# Patient Record
Sex: Female | Born: 1960 | Race: Black or African American | Hispanic: No | Marital: Married | Smoking: Current every day smoker
Health system: Southern US, Community
[De-identification: ages and names within clinical notes are randomized; demographics above are authoritative.]

## PROBLEM LIST (undated history)

## (undated) DIAGNOSIS — F32A Depression, unspecified: Secondary | ICD-10-CM

## (undated) DIAGNOSIS — I10 Essential (primary) hypertension: Secondary | ICD-10-CM

## (undated) DIAGNOSIS — D573 Sickle-cell trait: Secondary | ICD-10-CM

## (undated) DIAGNOSIS — M810 Age-related osteoporosis without current pathological fracture: Secondary | ICD-10-CM

## (undated) DIAGNOSIS — J449 Chronic obstructive pulmonary disease, unspecified: Secondary | ICD-10-CM

## (undated) DIAGNOSIS — N200 Calculus of kidney: Secondary | ICD-10-CM

## (undated) DIAGNOSIS — I509 Heart failure, unspecified: Secondary | ICD-10-CM

## (undated) DIAGNOSIS — F329 Major depressive disorder, single episode, unspecified: Secondary | ICD-10-CM

## (undated) DIAGNOSIS — E785 Hyperlipidemia, unspecified: Secondary | ICD-10-CM

## (undated) HISTORY — PX: TUBAL LIGATION: SHX77

---
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
## 2011-11-19 ENCOUNTER — Ambulatory Visit: Payer: Self-pay | Admitting: Family Medicine

## 2012-09-29 DIAGNOSIS — E785 Hyperlipidemia, unspecified: Secondary | ICD-10-CM | POA: Insufficient documentation

## 2012-09-29 DIAGNOSIS — I10 Essential (primary) hypertension: Secondary | ICD-10-CM

## 2013-06-08 ENCOUNTER — Emergency Department: Payer: Self-pay | Admitting: Emergency Medicine

## 2013-07-18 ENCOUNTER — Emergency Department: Payer: Self-pay | Admitting: Emergency Medicine

## 2013-07-18 LAB — CBC WITH DIFFERENTIAL/PLATELET
Eosinophil #: 0.3 10*3/uL (ref 0.0–0.7)
Eosinophil %: 4.3 %
HCT: 36.7 % (ref 35.0–47.0)
HGB: 12.4 g/dL (ref 12.0–16.0)
Lymphocyte #: 2.3 10*3/uL (ref 1.0–3.6)
Lymphocyte %: 33.5 %
MCH: 29.2 pg (ref 26.0–34.0)
MCHC: 33.8 g/dL (ref 32.0–36.0)
MCV: 86 fL (ref 80–100)
Monocyte #: 0.6 x10 3/mm (ref 0.2–0.9)
Monocyte %: 8.6 %
Neutrophil #: 3.5 10*3/uL (ref 1.4–6.5)
Platelet: 330 10*3/uL (ref 150–440)
RBC: 4.26 10*6/uL (ref 3.80–5.20)
RDW: 13.5 % (ref 11.5–14.5)
WBC: 6.8 10*3/uL (ref 3.6–11.0)

## 2013-07-18 LAB — TROPONIN I
Troponin-I: <0.02 ng/mL
Troponin-I: <0.02 ng/mL

## 2013-07-18 LAB — BASIC METABOLIC PANEL
Anion Gap: 5 — ABNORMAL LOW (ref 7–16)
Calcium, Total: 8.9 mg/dL (ref 8.5–10.1)
Chloride: 109 mmol/L — ABNORMAL HIGH (ref 98–107)
Osmolality: 283 (ref 275–301)
Sodium: 140 mmol/L (ref 136–145)

## 2014-07-05 ENCOUNTER — Emergency Department: Payer: Self-pay | Admitting: Emergency Medicine

## 2014-07-05 LAB — CBC
HCT: 41.7 % (ref 35.0–47.0)
HGB: 14.1 g/dL (ref 12.0–16.0)
MCH: 30.1 pg (ref 26.0–34.0)
MCHC: 33.8 g/dL (ref 32.0–36.0)
MCV: 89 fL (ref 80–100)
PLATELETS: 326 10*3/uL (ref 150–440)
RBC: 4.67 10*6/uL (ref 3.80–5.20)
RDW: 13.9 % (ref 11.5–14.5)
WBC: 5.9 10*3/uL (ref 3.6–11.0)

## 2014-07-05 LAB — URINALYSIS, COMPLETE
Bilirubin,UR: NEGATIVE
Blood: NEGATIVE
GLUCOSE, UR: NEGATIVE mg/dL (ref 0–75)
Hyaline Cast: 2
KETONE: NEGATIVE
NITRITE: NEGATIVE
Ph: 6 (ref 4.5–8.0)
Protein: NEGATIVE
RBC,UR: 1 /HPF (ref 0–5)
Specific Gravity: 1.011 (ref 1.003–1.030)
WBC UR: 19 /HPF (ref 0–5)

## 2014-07-05 LAB — COMPREHENSIVE METABOLIC PANEL
ALBUMIN: 3.7 g/dL (ref 3.4–5.0)
Alkaline Phosphatase: 117 U/L — ABNORMAL HIGH
Anion Gap: 6 — ABNORMAL LOW (ref 7–16)
BILIRUBIN TOTAL: 0.7 mg/dL (ref 0.2–1.0)
BUN: 18 mg/dL (ref 7–18)
CALCIUM: 8.6 mg/dL (ref 8.5–10.1)
CHLORIDE: 104 mmol/L (ref 98–107)
CO2: 31 mmol/L (ref 21–32)
Creatinine: 1.11 mg/dL (ref 0.60–1.30)
EGFR (Non-African Amer.): 55 — ABNORMAL LOW
GLUCOSE: 101 mg/dL — AB (ref 65–99)
Osmolality: 283 (ref 275–301)
Potassium: 3.8 mmol/L (ref 3.5–5.1)
SGOT(AST): 18 U/L (ref 15–37)
SGPT (ALT): 30 U/L
Sodium: 141 mmol/L (ref 136–145)
Total Protein: 7.7 g/dL (ref 6.4–8.2)

## 2014-07-05 LAB — TROPONIN I

## 2014-09-09 ENCOUNTER — Emergency Department: Payer: Self-pay | Admitting: Emergency Medicine

## 2015-07-18 ENCOUNTER — Emergency Department: Payer: Self-pay

## 2015-07-18 ENCOUNTER — Emergency Department
Admission: EM | Admit: 2015-07-18 | Discharge: 2015-07-18 | Disposition: A | Discharge status: Home / Self Care | Payer: Self-pay | Attending: Emergency Medicine | Admitting: Emergency Medicine

## 2015-07-18 ENCOUNTER — Encounter: Payer: Self-pay | Admitting: Emergency Medicine

## 2015-07-18 DIAGNOSIS — F17210 Nicotine dependence, cigarettes, uncomplicated: Secondary | ICD-10-CM | POA: Insufficient documentation

## 2015-07-18 DIAGNOSIS — Z79899 Other long term (current) drug therapy: Secondary | ICD-10-CM | POA: Insufficient documentation

## 2015-07-18 DIAGNOSIS — M545 Low back pain: Secondary | ICD-10-CM | POA: Insufficient documentation

## 2015-07-18 DIAGNOSIS — G8929 Other chronic pain: Secondary | ICD-10-CM | POA: Insufficient documentation

## 2015-07-18 HISTORY — DX: Calculus of kidney: N20.0

## 2015-07-18 MED ORDER — MELOXICAM 7.5 MG PO TABS: 7.5000 mg | ORAL_TABLET | Freq: Every day | ORAL | Status: AC

## 2015-07-18 NOTE — ED Provider Notes (Signed)
[facility] Emergency Department Provider Note  ____________________________________________  Time seen: Approximately 7:13 AM  I have reviewed the triage vital signs and the nursing notes.   HISTORY  Chief Complaint Back Pain  HPI Penny Gill is a 54 y.o. female is here complaining of low back pain. Patient states pain is nonradiating and worse with movement. Patient states that her low back pain has been going on for over a year. She states that she was first diagnosed with sciatica at [facility]. She states that she is now going to [facility] and was supposed to be referred for physical therapy. She states in the year that she has been having back pain and no one is x-rayed her back. She denies any urinary symptoms. She denies any paresthesias to her legs. Currently she is not taking any medication for her back pain. Currently she states her pain is 9 out of 10. Pain is worse with standing, walking or physical activity. She denies any recent injury but states she did have an old injury over a year ago that was work related.   Past Medical History  Diagnosis Date  . Kidney stone     There are no active problems to display for this patient.   Past Surgical History  Procedure Laterality Date  . Tubal ligation      Current Outpatient Rx  Name  Route  Sig  Dispense  Refill  . FLUoxetine (PROZAC) 40 MG capsule   Oral   Take 40 mg by mouth daily.         . meloxicam (MOBIC) 7.5 MG tablet   Oral   Take 1 tablet (7.5 mg total) by mouth daily.   30 tablet   2     Allergies Review of patient's allergies indicates no known allergies.  No family history on file.  Social History Social History  Substance Use Topics  . Smoking status: Current Every Day Smoker -- 0.50 packs/day    Types: Cigarettes  . Smokeless tobacco: None  . Alcohol Use: No    Review of Systems Constitutional: No fever/chills ENT: No sore throat. Cardiovascular:  Denies chest pain. Respiratory: Denies shortness of breath. Gastrointestinal: No abdominal pain.  No nausea, no vomiting.  No diarrhea.  No constipation. Genitourinary: Negative for dysuria. Musculoskeletal: Positive chronic low back pain. Skin: Negative for rash. Neurological: Negative for headaches, focal weakness or numbness.  10-point ROS otherwise negative.  ____________________________________________   PHYSICAL EXAM:  VITAL SIGNS: ED Triage Vitals  Enc Vitals Group     BP 07/18/15 0702 112/77 mmHg     Pulse Rate 07/18/15 0702 89     Resp 07/18/15 0702 18     Temp 07/18/15 0702 97.9 F (36.6 C)     Temp Source 07/18/15 0702 Oral     SpO2 07/18/15 0702 100 %     Weight 07/18/15 0702 86 lb 14.4 oz (39.418 kg)     Height 07/18/15 0702 5' (1.524 m)     Head Cir --      Peak Flow --      Pain Score 07/18/15 0701 9     Pain Loc --      Pain Edu? --      Excl. in GC? --     Constitutional: Alert and oriented. Well appearing and in no acute distress. Eyes: Conjunctivae are normal. PERRL. EOMI. Head: Atraumatic. Nose: No congestion/rhinnorhea. Neck: No stridor.   Cardiovascular: Normal rate, regular rhythm. Grossly normal  heart sounds.  Good peripheral circulation. Respiratory: Normal respiratory effort.  No retractions. Lungs CTAB. Gastrointestinal: Soft and nontender. No distention. Musculoskeletal: Examination of the back no gross deformity. There is some point tenderness on palpation of the L4-L5 and S1 area and paravertebral muscles. No active muscle spasms are seen with range of motion. Straight leg raises were 90 with discomfort in the thigh area bilaterally. No lower extremity tenderness nor edema.  No joint effusions. Neurologic:  Normal speech and language. No gross focal neurologic deficits are appreciated. No gait instability. Reflexes are 2+ bilaterally. Skin:  Skin is warm, dry and intact. No rash noted. Psychiatric: Mood and affect are normal. Speech and  behavior are normal.  ____________________________________________   LABS (all labs ordered are listed, but only abnormal results are displayed)  Labs Reviewed - No data to display  RADIOLOGY  Lumbar spine x-ray per radiologist shows very minimal degenerative changes. Mild levoscoliosis seen which may be positional. No acute findings. ____________________________________________   PROCEDURES  Procedure(s) performed: None  Critical Care performed: No  ____________________________________________   INITIAL IMPRESSION / ASSESSMENT AND PLAN / ED COURSE  Pertinent labs & imaging results that were available during my care of the patient were reviewed by me and considered in my medical decision making (see chart for details).  Patient was placed on medic 7.5 one daily she is also to follow-up with her primary care doctor at [facility] or Dr. Hyacinth Meeker in orthopedics. She is encouraged to use ice or heat to her back. She is told to return to the emergency room if any sudden loss of bowel or bladder function ____________________________________________   FINAL CLINICAL IMPRESSION(S) / ED DIAGNOSES  Final diagnoses:  Chronic low back pain      Tommi Rumps, PA-C 07/18/15 1023  Sharman Cheek, MD 07/18/15 [phone]

## 2015-07-18 NOTE — ED Notes (Signed)
Having lower back pain.   Pain is non radiating. Denies any recent injury  States she had an old injury  Ambulates well to treatment room

## 2015-07-18 NOTE — ED Notes (Signed)
Patient ambulatory to triage with steady gait, without difficulty or distress noted; pt reports lower back pain x year; st old work injury and was dx with sciatica

## 2015-08-02 DIAGNOSIS — M47816 Spondylosis without myelopathy or radiculopathy, lumbar region: Secondary | ICD-10-CM | POA: Insufficient documentation

## 2015-09-11 ENCOUNTER — Emergency Department
Admission: EM | Admit: 2015-09-11 | Discharge: 2015-09-11 | Disposition: A | Discharge status: Home / Self Care | Payer: Self-pay | Attending: Emergency Medicine | Admitting: Emergency Medicine

## 2015-09-11 ENCOUNTER — Encounter: Payer: Self-pay | Admitting: Emergency Medicine

## 2015-09-11 DIAGNOSIS — Z79899 Other long term (current) drug therapy: Secondary | ICD-10-CM | POA: Insufficient documentation

## 2015-09-11 DIAGNOSIS — F17210 Nicotine dependence, cigarettes, uncomplicated: Secondary | ICD-10-CM | POA: Insufficient documentation

## 2015-09-11 DIAGNOSIS — K029 Dental caries, unspecified: Secondary | ICD-10-CM | POA: Insufficient documentation

## 2015-09-11 DIAGNOSIS — K0889 Other specified disorders of teeth and supporting structures: Secondary | ICD-10-CM

## 2015-09-11 DIAGNOSIS — K011 Impacted teeth: Secondary | ICD-10-CM | POA: Insufficient documentation

## 2015-09-11 DIAGNOSIS — K047 Periapical abscess without sinus: Secondary | ICD-10-CM | POA: Insufficient documentation

## 2015-09-11 MED ORDER — LIDOCAINE VISCOUS 2 % MT SOLN: 20.0000 mL | OROMUCOSAL | Status: DC | PRN

## 2015-09-11 MED ORDER — AMOXICILLIN 500 MG PO CAPS: 500.0000 mg | ORAL_CAPSULE | Freq: Three times a day (TID) | ORAL | Status: DC

## 2015-09-11 NOTE — Discharge Instructions (Signed)
Dental Care and Dentist Visits °Dental care supports good overall health. Regular dental visits can also help you avoid dental pain, bleeding, infection, and other more serious health problems in the future. It is important to keep the mouth healthy because diseases in the teeth, gums, and other oral tissues can spread to other areas of the body. Some problems, such as diabetes, heart disease, and pre-term labor have been associated with poor oral health.  °See your dentist every 6 months. If you experience emergency problems such as a toothache or broken tooth, go to the dentist right away. If you see your dentist regularly, you may catch problems early. It is easier to be treated for problems in the early stages.  °WHAT TO EXPECT AT A DENTIST VISIT  °Your dentist will look for many common oral health problems and recommend proper treatment. At your regular dental visit, you can expect: °· Gentle cleaning of the teeth and gums. This includes scraping and polishing. This helps to remove the sticky substance around the teeth and gums (plaque). Plaque forms in the mouth shortly after eating. Over time, plaque hardens on the teeth as tartar. If tartar is not removed regularly, it can cause problems. Cleaning also helps remove stains. °· Periodic X-rays. These pictures of the teeth and supporting bone will help your dentist assess the health of your teeth. °· Periodic fluoride treatments. Fluoride is a natural mineral shown to help strengthen teeth. Fluoride treatment involves applying a fluoride gel or varnish to the teeth. It is most commonly done in children. °· Examination of the mouth, tongue, jaws, teeth, and gums to look for any oral health problems, such as: °· Cavities (dental caries). This is decay on the tooth caused by plaque, sugar, and acid in the mouth. It is best to catch a cavity when it is small. °· Inflammation of the gums caused by plaque buildup (gingivitis). °· Problems with the mouth or malformed  or misaligned teeth. °· Oral cancer or other diseases of the soft tissues or jaws.  °KEEP YOUR TEETH AND GUMS HEALTHY °For healthy teeth and gums, follow these general guidelines as well as your dentist's specific advice: °· Have your teeth professionally cleaned at the dentist every 6 months. °· Brush twice daily with a fluoride toothpaste. °· Floss your teeth daily.  °· Ask your dentist if you need fluoride supplements, treatments, or fluoride toothpaste. °· Eat a healthy diet. Reduce foods and drinks with added sugar. °· Avoid smoking. °TREATMENT FOR ORAL HEALTH PROBLEMS °If you have oral health problems, treatment varies depending on the conditions present in your teeth and gums. °· Your caregiver will most likely recommend good oral hygiene at each visit. °· For cavities, gingivitis, or other oral health disease, your caregiver will perform a procedure to treat the problem. This is typically done at a separate appointment. Sometimes your caregiver will refer you to another dental specialist for specific tooth problems or for surgery. °SEEK IMMEDIATE DENTAL CARE IF: °· You have pain, bleeding, or soreness in the gum, tooth, jaw, or mouth area. °· A permanent tooth becomes loose or separated from the gum socket. °· You experience a blow or injury to the mouth or jaw area. °  °This information is not intended to replace advice given to you by your health care provider. Make sure you discuss any questions you have with your health care provider. °  °Document Released: 04/23/2011 Document Revised: 11/03/2011 Document Reviewed: 04/23/2011 °Elsevier Interactive Patient Education ©2016 [employer] ° °Dental Abscess °A   dental abscess is pus in or around a tooth. °HOME CARE °· Take medicines only as told by your dentist. °· If you were prescribed antibiotic medicine, finish all of it even if you start to feel better. °· Rinse your mouth (gargle) often with salt water. °· Do not drive or use heavy machinery, like a lawn  mower, while taking pain medicine. °· Do not apply heat to the outside of your mouth. °· Keep all follow-up visits as told by your dentist. This is important. °GET HELP IF: °· Your pain is worse, and medicine does not help. °GET HELP RIGHT AWAY IF: °· You have a fever or chills. °· Your symptoms suddenly get worse. °· You have a very bad headache. °· You have problems breathing or swallowing. °· You have trouble opening your mouth. °· You have puffiness (swelling) in your neck or around your eye. °  °This information is not intended to replace advice given to you by your health care provider. Make sure you discuss any questions you have with your health care provider. °  °Document Released: 12/26/2014 Document Reviewed: 12/26/2014 °Elsevier Interactive Patient Education ©2016 [employer] Inc. ° °

## 2015-09-11 NOTE — ED Notes (Signed)
Having dental pain for the past 3 months  States having increased pain with biting down

## 2015-09-11 NOTE — ED Provider Notes (Signed)
CSN: [id number]     Arrival date & time 09/11/15  0813 History   First MD Initiated Contact with Patient 09/11/15 [phone]     Chief Complaint  Patient presents with  . Dental Pain     HPI Comments: 55 year old female presents today complaining of increased dental pain for the past 3 months. Continues to smoke 1/2 ppd of cigarette. No known dental injury. Seen by [facility] dental clinic 3 weeks ago and was told she did not have any infection. Pain is in bilateral molars due to wisdom teeth putting pressure on them. Needs these extracted but can't afford it.   The history is provided by the patient.    Past Medical History  Diagnosis Date  . Kidney stone    Past Surgical History  Procedure Laterality Date  . Tubal ligation     No family history on file. Social History  Substance Use Topics  . Smoking status: Current Every Day Smoker -- 0.50 packs/day    Types: Cigarettes  . Smokeless tobacco: None  . Alcohol Use: No   OB History    No data available     Review of Systems  Constitutional: Negative for fever and chills.  HENT: Positive for dental problem.   All other systems reviewed and are negative.     Allergies  Review of patient's allergies indicates no known allergies.  Home Medications   Prior to Admission medications   Medication Sig Start Date End Date Taking? Authorizing Provider  amoxicillin (AMOXIL) 500 MG capsule Take 1 capsule (500 mg total) by mouth 3 (three) times daily. 09/11/15   Christella Scheuermann, PA-C  FLUoxetine (PROZAC) 40 MG capsule Take 40 mg by mouth daily.    Historical Provider, MD  lidocaine (XYLOCAINE) 2 % solution Use as directed 20 mLs in the mouth or throat as needed for mouth pain. 09/11/15   Christella Scheuermann, PA-C  meloxicam (MOBIC) 7.5 MG tablet Take 1 tablet (7.5 mg total) by mouth daily. 07/18/15 07/17/16  Tommi Rumps, PA-C   BP 131/92 mmHg  Pulse 85  Temp(Src) 98.4 F (36.9 C) (Oral)  Resp 18  Ht 5' (1.524 m)  Wt 39.917 kg  BMI  17.19 kg/m2  SpO2 100% Physical Exam  Constitutional: She is oriented to person, place, and time. Vital signs are normal. She appears well-developed and well-nourished. She is active.  Non-toxic appearance. She does not have a sickly appearance. She does not appear ill.  HENT:  Head: Normocephalic and atraumatic.  Mouth/Throat: Uvula is midline, oropharynx is clear and moist and mucous membranes are normal. Dental caries present.  Upper wisdom teeth present, gum swelling around both of them   Eyes: Conjunctivae are normal. Pupils are equal, round, and reactive to light.  Neck: Normal range of motion. Neck supple.  Lymphadenopathy:    She has no cervical adenopathy.  Neurological: She is alert and oriented to person, place, and time.  Skin: Skin is warm and dry.  Psychiatric: She has a normal mood and affect. Her behavior is normal. Judgment and thought content normal.  Nursing note and vitals reviewed.   ED Course  Procedures (including critical care time) Labs Review Labs Reviewed - No data to display  Imaging Review No results found. I have personally reviewed and evaluated these images and lab results as part of my medical decision-making.   EKG Interpretation None      MDM  Amox  TID x 10 days Viscous lidocaine as needed  Continue advil use Encouraged her to establish a dentist  Final diagnoses:  Dental caries  Pain, dental  Dental abscess        Christella Scheuermann, PA-C 09/11/15 0845  Myrna Blazer, MD 09/11/15 [phone]

## 2016-03-25 ENCOUNTER — Emergency Department
Admission: EM | Admit: 2016-03-25 | Discharge: 2016-03-25 | Disposition: A | Discharge status: Home / Self Care | Payer: Self-pay | Attending: Emergency Medicine | Admitting: Emergency Medicine

## 2016-03-25 ENCOUNTER — Emergency Department: Payer: Self-pay

## 2016-03-25 ENCOUNTER — Encounter: Payer: Self-pay | Admitting: Emergency Medicine

## 2016-03-25 DIAGNOSIS — M778 Other enthesopathies, not elsewhere classified: Secondary | ICD-10-CM

## 2016-03-25 DIAGNOSIS — F17210 Nicotine dependence, cigarettes, uncomplicated: Secondary | ICD-10-CM | POA: Insufficient documentation

## 2016-03-25 DIAGNOSIS — M779 Enthesopathy, unspecified: Secondary | ICD-10-CM | POA: Insufficient documentation

## 2016-03-25 DIAGNOSIS — M79644 Pain in right finger(s): Secondary | ICD-10-CM

## 2016-03-25 DIAGNOSIS — IMO0002 Reserved for concepts with insufficient information to code with codable children: Secondary | ICD-10-CM

## 2016-03-25 MED ORDER — OXYCODONE-ACETAMINOPHEN 5-325 MG PO TABS: 1.0000 | ORAL_TABLET | Freq: Three times a day (TID) | ORAL | 0 refills | Status: DC | PRN

## 2016-03-25 MED ORDER — SULFAMETHOXAZOLE-TRIMETHOPRIM 400-80 MG PO TABS: 1.0000 | ORAL_TABLET | Freq: Two times a day (BID) | ORAL | 0 refills | Status: DC

## 2016-03-25 NOTE — ED Provider Notes (Signed)
Penny Gill ____________________________________________  Time seen: Approximately 0930 AM  I have reviewed the triage vital signs and the nursing notes.   HISTORY  Chief Complaint Hand Pain   HPI Penny Gill is a 55 y.o. female presents for the complaint of right ring finger pain. Patient reports one week ago she was moving items and accidentally snagged her right distal nail. Patient reports when she did this she had gel nails in place and states that this caused her actual nail to split. Patient reports she has since had gel nails reapplied and covered the area. Patient reports that she has had gradual redness and swelling around the area. Patient reports pain to the entire distal tip of her right ring finger. Denies crush injury. Denies decreased sensation. Denies numbness or tingling sensation. Denies decreased movement. Denies any drainage. Denies any other trauma or foreign bodies. Denies fevers.  Patient also reports she has recently started a new job in which she uses both of her forearms frequently. Patient reports bilateral dorsal forearms with some tenderness and intermittent swelling. Patient reports tenderness is only present with active range of motion and movement. Denies any numbness or tingling sensation, pain radiation, decreased movement, fall, trauma or other injury. Patient reports right hand dominant.  Patient also reports she does have a history of high blood pressure but has not yet taken her medications today. Denies chest pain, shortness of breath, headache, dizziness, vision changes, neck pain, back pain, fall, crush injury, extremity swelling or other extremity pain.  No LMP recorded. Patient is postmenopausal.   Penny Gill: PCP   Past Medical History:  Diagnosis Date  . Kidney stone     There are no active problems to display for this patient.   Past Surgical History:  Procedure Laterality Date  . TUBAL LIGATION      No current facility-administered medications for this encounter.   Current Outpatient Prescriptions:  . Marland Kitchen  FLUoxetine (PROZAC) 40 MG capsule, Take 40 mg by mouth daily., Disp: , Rfl:  .  lidocaine (XYLOCAINE) 2 % solution, Use as directed 20 mLs in the mouth or throat as needed for mouth pain., Disp: 100 mL, Rfl: 0 .  oxyCODONE-acetaminophen (ROXICET) 5-325 MG tablet, Take 1 tablet by mouth every 8 (eight) hours as needed for moderate pain or severe pain (Do not drive or operate heavy machinery while taking as can cause drowsiness.)., Disp: 8 tablet, Rfl: 0 .  sulfamethoxazole-trimethoprim (BACTRIM) 400-80 MG tablet, Take 1 tablet by mouth 2 (two) times daily., Disp: 20 tablet, Rfl: 0  Allergies Review of patient's allergies indicates no known allergies.  History reviewed. No pertinent family history.  Social History Social History  Substance Use Topics  . Smoking status: Current Every Day Smoker    Packs/day: 0.50    Types: Cigarettes  . Smokeless tobacco: Never Used  . Alcohol use No    Review of Systems Constitutional: No fever/chills Eyes: No visual changes. ENT: No sore throat. Cardiovascular: Denies chest pain. Respiratory: Denies shortness of breath. Gastrointestinal: No abdominal pain.  No nausea, no vomiting.  No diarrhea.  No constipation. Genitourinary: Negative for dysuria. Musculoskeletal: Negative for back pain. As above. Skin: Negative for rash. As above. Neurological: Negative for headaches, focal weakness or numbness.  10-point ROS otherwise negative.  ____________________________________________   PHYSICAL EXAM:  VITAL SIGNS: ED Triage Vitals  Enc Vitals Group     BP 03/25/16 0813 (!) 165/102     Pulse Rate 03/25/16 0813 80  Resp 03/25/16 0813 18     Temp 03/25/16 0813 98.4 F (36.9 C)     Temp Source 03/25/16 0813 Oral     SpO2 03/25/16 0813 100 %     Weight 03/25/16 0813 92 lb 12.8 oz (42.1 kg)     Height 03/25/16 0813 5' (1.524 m)      Head Circumference --      Peak Flow --      Pain Score 03/25/16 0805 10     Pain Loc --      Pain Edu? --      Excl. in GC? --     Constitutional: Alert and oriented. Well appearing and in no acute distress. Eyes: Conjunctivae are normal. PERRL. EOMI. ENT      Head: Normocephalic and atraumatic.      Nose: No congestion/rhinnorhea.      Mouth/Throat: Mucous membranes are moist.Oropharynx non-erythematous. Neck: No stridor. Supple without meningismus.  Hematological/Lymphatic/Immunilogical: No cervical lymphadenopathy. Cardiovascular: Normal rate, regular rhythm. Grossly normal heart sounds.  Good peripheral circulation. Respiratory: Normal respiratory effort without tachypnea nor retractions. Breath sounds are clear and equal bilaterally. No wheezes/rales/rhonchi.. Gastrointestinal: Soft and nontender. No distention. No CVA tenderness. Musculoskeletal:  Nontender with normal range of motion in all extremities. No midline cervical, thoracic or lumbar tenderness to palpation. Bilateral pedal pulses equal and easily palpated. Except: Right distal ring finger mild swelling, mild erythema at distal phalanx along lateral nail and volar aspect, no noted paronychia,moderate tenderness at distal phalanx, no fluctuance, no induration, no drainage, full range of motion, normal capillary refill, sensation intact, no motor or tendon deficit. Patient with thick gel nails present covering the nail, no nail bed injury noted but unable to visualize natural nail. Except : Bilateral dorsal mid medial forearms minimal to mild tenderness to direct palpation over bilateral extensor tendons, full range of motion present, no motor or tendon deficits, no erythema, sensation intact, bilateral hand grips strong and equal, bilateral distal radial pulses equal and easily palpated.  Neurologic:  Normal speech and language. No gross focal neurologic deficits are appreciated. Speech is normal. No gait instability.  Skin:   Skin is warm, dry and intact. No rash noted. Psychiatric: Mood and affect are normal. Speech and behavior are normal. Patient exhibits appropriate insight and judgment   ___________________________________________   LABS (all labs ordered are listed, but only abnormal results are displayed)  Labs Reviewed - No data to display  RADIOLOGY  Dg Finger Ring Right  Result Date: 03/25/2016 CLINICAL DATA:  Broken fake nail and the patient now complains of pain and swelling. EXAM: RIGHT RING FINGER 2+V COMPARISON:  None. FINDINGS: Negative for fracture or dislocation involving the right ring finger. Difficult to exclude soft tissue swelling along the distal aspect of the ring finger. No evidence for a radiopaque foreign body. IMPRESSION: No acute bone abnormality. Electronically Signed   By: Richarda Overlie M.D.   On: 03/25/2016 09:54   ____________________________________________   PROCEDURES Procedures   Right ring finger splint applied by RN. Patient declined bilateral forearms splints.  ____________________________________________   INITIAL IMPRESSION / ASSESSMENT AND PLAN / ED COURSE  Pertinent labs & imaging results that were available during my care of the patient were reviewed by me and considered in my medical decision making (see chart for details).  Well-appearing patient. No acute distress. Presenting for right ring finger pain and erythema and swelling post reported nail injury. Denies loss of nail but reports she had an injury where the  nail was pressed downwards and caused a crack in her proximal nail. Unable to visualize nail from artificial nails. Patient states she will go and have nail salon remove artificial nails. Patient reports gradual redness and swelling since. No fluctuance or induration noted no I&D indicated. Suspect localized infection.Per radiologist right fourth finger xray no acute bony abnormality. Will treat with oral Bactrim, cleaning, warm soaks and close  monitoring.   Patient also reports bilateral forearm intermittent pain since starting a new job with repetitive form movements. Suspect overuse injury and forearm tendinitis. Counseled regarding stretching, icing and good body mechanics. Will treat with when necessary percocet as needed for breakthrough pain, discussed taking half tablet as needed. Patient declined splinting. Discussed indication, risks and benefits of medications with patient.  Encourage patient to follow-up with her primary care physician regarding her blood pressure medications as well as to take her medications at home as directed. Discussed follow up with Primary care physician this week. Discussed follow up and return parameters including no resolution or any worsening concerns. Patient verbalized understanding and agreed to plan.   ____________________________________________   FINAL CLINICAL IMPRESSION(S) / ED DIAGNOSES  Final diagnoses:  Pain of finger of right hand  Tendinitis of elbow or forearm     Discharge Medication List as of 03/25/2016 10:03 AM    START taking these medications   Details  oxyCODONE-acetaminophen (ROXICET) 5-325 MG tablet Take 1 tablet by mouth every 8 (eight) hours as needed for moderate pain or severe pain (Do not drive or operate heavy machinery while taking as can cause drowsiness.)., Starting Tue 03/25/2016, Print    sulfamethoxazole-trimethoprim (BACTRIM) 400-80 MG tablet Take 1 tablet by mouth 2 (two) times daily., Starting Tue 03/25/2016, Print        Note: This dictation was prepared with Dragon dictation along with smaller phrase technology. Any transcriptional errors that result from this process are unintentional.    Clinical Course       Renford Dills, NP 03/25/16 1122

## 2016-03-25 NOTE — ED Triage Notes (Signed)
Pt to ed with c/o right hand fourth digit pain that started after she broke a nail.

## 2016-03-25 NOTE — Discharge Instructions (Signed)
Take medication as prescribed. Rest. Drink plenty of fluids. Stretch arms multiple times per day as discussed.   Follow up with your primary care physician this week. Take home blood pressures medication as prescribed. Return to ER for new or worsening concerns.

## 2016-03-25 NOTE — ED Notes (Signed)
See triage note  States she hit her finger  Broke a nail  Now right 4 th finger is swollen and tender.

## 2017-03-31 ENCOUNTER — Other Ambulatory Visit: Payer: Self-pay | Admitting: Family Medicine

## 2017-03-31 DIAGNOSIS — Z1231 Encounter for screening mammogram for malignant neoplasm of breast: Secondary | ICD-10-CM

## 2017-04-15 ENCOUNTER — Ambulatory Visit
Admission: RE | Admit: 2017-04-15 | Discharge: 2017-04-15 | Disposition: A | Discharge status: Home / Self Care | Payer: BLUE CROSS/BLUE SHIELD | Source: Ambulatory Visit | Attending: Family Medicine | Admitting: Family Medicine

## 2017-04-15 DIAGNOSIS — Z1231 Encounter for screening mammogram for malignant neoplasm of breast: Secondary | ICD-10-CM | POA: Diagnosis not present

## 2017-04-21 ENCOUNTER — Other Ambulatory Visit: Payer: Self-pay | Admitting: Family Medicine

## 2017-04-21 DIAGNOSIS — R928 Other abnormal and inconclusive findings on diagnostic imaging of breast: Secondary | ICD-10-CM

## 2017-04-21 DIAGNOSIS — N6320 Unspecified lump in the left breast, unspecified quadrant: Secondary | ICD-10-CM

## 2017-04-30 ENCOUNTER — Ambulatory Visit
Admission: RE | Admit: 2017-04-30 | Discharge: 2017-04-30 | Disposition: A | Discharge status: Home / Self Care | Payer: BLUE CROSS/BLUE SHIELD | Source: Ambulatory Visit | Attending: Family Medicine | Admitting: Family Medicine

## 2017-04-30 DIAGNOSIS — N6320 Unspecified lump in the left breast, unspecified quadrant: Secondary | ICD-10-CM

## 2017-04-30 DIAGNOSIS — R928 Other abnormal and inconclusive findings on diagnostic imaging of breast: Secondary | ICD-10-CM | POA: Insufficient documentation

## 2017-04-30 DIAGNOSIS — N6321 Unspecified lump in the left breast, upper outer quadrant: Secondary | ICD-10-CM | POA: Insufficient documentation

## 2017-10-11 ENCOUNTER — Other Ambulatory Visit: Payer: Self-pay

## 2017-10-11 ENCOUNTER — Emergency Department
Admission: EM | Admit: 2017-10-11 | Discharge: 2017-10-11 | Disposition: A | Discharge status: Home / Self Care | Payer: BLUE CROSS/BLUE SHIELD | Attending: Emergency Medicine | Admitting: Emergency Medicine

## 2017-10-11 ENCOUNTER — Encounter: Payer: Self-pay | Admitting: Emergency Medicine

## 2017-10-11 DIAGNOSIS — F17210 Nicotine dependence, cigarettes, uncomplicated: Secondary | ICD-10-CM | POA: Diagnosis not present

## 2017-10-11 DIAGNOSIS — N12 Tubulo-interstitial nephritis, not specified as acute or chronic: Secondary | ICD-10-CM | POA: Insufficient documentation

## 2017-10-11 DIAGNOSIS — R109 Unspecified abdominal pain: Secondary | ICD-10-CM | POA: Diagnosis present

## 2017-10-11 DIAGNOSIS — Z79899 Other long term (current) drug therapy: Secondary | ICD-10-CM | POA: Insufficient documentation

## 2017-10-11 DIAGNOSIS — R509 Fever, unspecified: Secondary | ICD-10-CM | POA: Insufficient documentation

## 2017-10-11 DIAGNOSIS — I10 Essential (primary) hypertension: Secondary | ICD-10-CM | POA: Diagnosis not present

## 2017-10-11 LAB — URINALYSIS, COMPLETE (UACMP) WITH MICROSCOPIC
Bilirubin Urine: NEGATIVE
GLUCOSE, UA: NEGATIVE mg/dL
Ketones, ur: NEGATIVE mg/dL
NITRITE: NEGATIVE
PH: 6 (ref 5.0–8.0)
PROTEIN: NEGATIVE mg/dL
SPECIFIC GRAVITY, URINE: 1.004 — AB (ref 1.005–1.030)

## 2017-10-11 LAB — BASIC METABOLIC PANEL
Anion gap: 10 (ref 5–15)
BUN: 10 mg/dL (ref 6–20)
CHLORIDE: 102 mmol/L (ref 101–111)
CO2: 24 mmol/L (ref 22–32)
CREATININE: 0.73 mg/dL (ref 0.44–1.00)
Calcium: 9.1 mg/dL (ref 8.9–10.3)
GFR calc non Af Amer: >60 mL/min (ref >60)
GLUCOSE: 177 mg/dL — AB (ref 65–99)
Potassium: 3.5 mmol/L (ref 3.5–5.1)
Sodium: 136 mmol/L (ref 135–145)

## 2017-10-11 LAB — CBC
HEMATOCRIT: 38.1 % (ref 35.0–47.0)
HEMOGLOBIN: 12.9 g/dL (ref 12.0–16.0)
MCH: 29.4 pg (ref 26.0–34.0)
MCHC: 33.8 g/dL (ref 32.0–36.0)
MCV: 87 fL (ref 80.0–100.0)
Platelets: 280 10*3/uL (ref 150–440)
RBC: 4.38 MIL/uL (ref 3.80–5.20)
RDW: 13.4 % (ref 11.5–14.5)
WBC: 19.5 10*3/uL — ABNORMAL HIGH (ref 3.6–11.0)

## 2017-10-11 LAB — INFLUENZA PANEL BY PCR (TYPE A & B)
INFLBPCR: NEGATIVE
Influenza A By PCR: NEGATIVE

## 2017-10-11 MED ORDER — CEPHALEXIN 500 MG PO CAPS: 500.0000 mg | ORAL_CAPSULE | Freq: Two times a day (BID) | ORAL | 0 refills | Status: AC

## 2017-10-11 MED ORDER — SODIUM CHLORIDE 0.9 % IV BOLUS (SEPSIS)
1000.0000 mL | Freq: Once | INTRAVENOUS | Status: AC
  Administered 2017-10-11: 1000 mL via INTRAVENOUS

## 2017-10-11 MED ORDER — ACETAMINOPHEN 325 MG PO TABS
650.0000 mg | ORAL_TABLET | Freq: Once | ORAL | Status: AC
  Administered 2017-10-11: 650 mg via ORAL
  Filled 2017-10-11: qty 2

## 2017-10-11 MED ORDER — IBUPROFEN 600 MG PO TABS: 600.0000 mg | ORAL_TABLET | Freq: Four times a day (QID) | ORAL | 0 refills | Status: DC | PRN

## 2017-10-11 MED ORDER — SODIUM CHLORIDE 0.9 % IV BOLUS (SEPSIS)
1000.0000 mL | Freq: Once | INTRAVENOUS | Status: AC
Start: 2017-10-11 — End: 2017-10-11
  Administered 2017-10-11: 1000 mL via INTRAVENOUS

## 2017-10-11 MED ORDER — KETOROLAC TROMETHAMINE 30 MG/ML IJ SOLN
30.0000 mg | Freq: Once | INTRAMUSCULAR | Status: AC
  Administered 2017-10-11: 30 mg via INTRAVENOUS
  Filled 2017-10-11: qty 1

## 2017-10-11 MED ORDER — SODIUM CHLORIDE 0.9 % IV SOLN
1.0000 g | Freq: Once | INTRAVENOUS | Status: AC
  Administered 2017-10-11: 1 g via INTRAVENOUS
  Filled 2017-10-11: qty 10

## 2017-10-11 NOTE — Discharge Instructions (Signed)
Take the antibiotic as prescribed and finish the full course.  Return to the emergency department for new or worsening pain, vomiting or inability to take the medications, weakness or lightheadedness, high fevers, or any other new or worsening symptoms that concern you.  Follow-up with your primary care doctor within the next week.

## 2017-10-11 NOTE — ED Notes (Signed)
Gave the patient a specimen cup because she did not have to use the restroom at this time.

## 2017-10-11 NOTE — ED Provider Notes (Signed)
[facility] Emergency Department Provider Note ____________________________________________   None    (approximate)  I have reviewed the triage vital signs and the nursing notes.   HISTORY  Chief Complaint Fever and Flank Pain    HPI Penny Gill is a 57 y.o. female with past medical history as noted below as well as hypertension and high cholesterol, who presents with right flank pain acute onset yesterday, constant in course, nonradiating, and not associated with dysuria or hematuria.  Patient also reports around the same time yesterday she developed subjective fever, as well as generalized weakness and malaise.  She denies associated nausea, vomiting, diarrhea, cough, shortness of breath, or other acute symptoms.   Past Medical History:  Diagnosis Date  . Kidney stone     There are no active problems to display for this patient.   Past Surgical History:  Procedure Laterality Date  . TUBAL LIGATION      Prior to Admission medications   Medication Sig Start Date End Date Taking? Authorizing Provider  amLODipine (NORVASC) 5 MG tablet Take 5 mg by mouth daily. 10/02/17  Yes [provider]  atorvastatin (LIPITOR) 20 MG tablet Take 20 mg by mouth at bedtime. 10/02/17  Yes [provider]  cyclobenzaprine (FLEXERIL) 10 MG tablet Take 10 mg by mouth 3 (three) times daily as needed. 10/01/17  Yes [provider]  meloxicam (MOBIC) 15 MG tablet Take 15 mg by mouth daily. 09/24/17  Yes [provider]  mirtazapine (REMERON) 30 MG tablet Take 30 mg by mouth at bedtime. 09/24/17  Yes [provider]  nortriptyline (PAMELOR) 25 MG capsule Take 25 mg by mouth at bedtime. 09/24/17  Yes [provider]  Prenatal Vit-Fe Fumarate-FA (PREPLUS) 27-1 MG TABS Take 1 tablet by mouth daily. 09/30/17  Yes [provider]  traZODone (DESYREL) 150 MG tablet Take 150 mg by mouth at bedtime. 09/24/17  Yes [provider]  lidocaine (XYLOCAINE) 2 % solution Use as directed 20 mLs in the mouth or throat as needed for mouth pain. Patient not taking: Reported on 10/11/2017 09/11/15   Christella Scheuermann, PA-C  oxyCODONE-acetaminophen (ROXICET) 5-325 MG tablet Take 1 tablet by mouth every 8 (eight) hours as needed for moderate pain or severe pain (Do not drive or operate heavy machinery while taking as can cause drowsiness.). Patient not taking: Reported on 10/11/2017 03/25/16   Renford Dills, NP    Allergies Patient has no known allergies.  Family History  Problem Relation Age of Onset  . Breast cancer Maternal Aunt 36    Social History Social History   Tobacco Use  . Smoking status: Current Every Day Smoker    Packs/day: 0.50    Types: Cigarettes  . Smokeless tobacco: Never Used  Substance Use Topics  . Alcohol use: No  . Drug use: No    Review of Systems  Constitutional: Positive for fever. Eyes: No redness. ENT: No sore throat. Cardiovascular: Denies chest pain. Respiratory: Denies cough or shortness of breath. Gastrointestinal: No nausea, no vomiting.  No diarrhea.  Genitourinary: Negative for dysuria.  Musculoskeletal: Positive for body aches. Skin: Negative for rash. Neurological: Negative for headache.   ____________________________________________   PHYSICAL EXAM:  VITAL SIGNS: ED Triage Vitals  Enc Vitals Group     BP 10/11/17 1042 126/89     Pulse Rate 10/11/17 1042 (!) 124     Resp 10/11/17 1042 18     Temp 10/11/17 1042 100.1 F (37.8  C)     Temp Source 10/11/17 1042 Oral     SpO2 10/11/17 1042 97 %     Weight 10/11/17 1043 102 lb (46.3 kg)     Height 10/11/17 1043 5' (1.524 m)     Head Circumference --      Peak Flow --      Pain Score 10/11/17 1042 10     Pain Loc --      Pain Edu? --      Excl. in [place]? --     Constitutional: Alert and oriented. Well appearing and in no acute distress. Eyes: Conjunctivae are normal.  Head: Atraumatic. Nose: No  congestion/rhinnorhea. Mouth/Throat: Mucous membranes are moist.  Oropharynx clear with no exudates. Neck: Normal range of motion.  Cardiovascular: Tachycardic, regular rhythm. Grossly normal heart sounds.  Good peripheral circulation. Respiratory: Normal respiratory effort.  No retractions. Lungs CTAB. Gastrointestinal: Soft and nontender. No distention.  Genitourinary: Mild right CVA tenderness.  Mild right flank tenderness. Musculoskeletal: No lower extremity edema.  Extremities warm and well perfused.  Neurologic:  Normal speech and language. No gross focal neurologic deficits are appreciated.  Skin:  Skin is warm and dry. No rash noted. Psychiatric: Mood and affect are normal. Speech and behavior are normal.  ____________________________________________   LABS (all labs ordered are listed, but only abnormal results are displayed)  Labs Reviewed  URINALYSIS, COMPLETE (UACMP) WITH MICROSCOPIC - Abnormal; Notable for the following components:      Result Value   Color, Urine YELLOW (*)    APPearance CLOUDY (*)    Specific Gravity, Urine 1.004 (*)    Hgb urine dipstick MODERATE (*)    Leukocytes, UA LARGE (*)    Bacteria, UA MANY (*)    Squamous Epithelial / LPF 6-30 (*)    All other components within normal limits  CBC - Abnormal; Notable for the following components:   WBC 19.5 (*)    All other components within normal limits  BASIC METABOLIC PANEL - Abnormal; Notable for the following components:   Glucose, Bld 177 (*)    All other components within normal limits  URINE CULTURE  INFLUENZA PANEL BY PCR (TYPE A & B)   ____________________________________________  EKG   ____________________________________________  RADIOLOGY    ____________________________________________   PROCEDURES  Procedure(s) performed: No  Procedures  Critical Care performed: No ____________________________________________   INITIAL IMPRESSION / ASSESSMENT AND PLAN / ED  COURSE  Pertinent labs & imaging results that were available during my care of the patient were reviewed by me and considered in my medical decision making (see chart for details).  57 year old female with past medical history as noted above presents with 1 day of right flank pain associated with fever, myalgias, and malaise.  But with no GI or respiratory symptoms and no headache.  Past medical records reviewed in epic and are noncontributory.  On exam, the patient has low-grade fever and slight tachycardia, but other vital signs are within normal limits.  She is relatively well-appearing, and the remainder the exam is as described above, notable primarily for mild right flank tenderness.  Differential includes influenza or other viral syndrome, UTI/pyelonephritis, myalgias related to viral syndrome, or less likely intra-abdominal etiology.  Patient has history of ureteral stones, but states this feels somewhat different and given the presence of fever and generalized malaise, this is more consistent with infectious etiology.  Plan: Labs, UA, flu swab, fluids, APAP, and reassess.   ----------------------------------------- 2:23 PM on 10/11/2017 -----------------------------------------  UA is consistent with UTI (with predominance of WBCs and bacteria, and few RBCs), so patient symptoms are most likely due to pyelonephritis.  Patient's heart rate had been improving, but now on reassessment it is 130.  The patient still appears relatively well.  We will give additional antipyretic, fluids, and first dose of ceftriaxone in the ED and reassess.  Anticipate the patient likely still will be able to be discharged home, and this would be her preference.  ----------------------------------------- 4:43 PM on 10/11/2017 -----------------------------------------  On reassessment, the patient states that she feels much better and would like to go home.  Her heart rate at this time at rest is  approximately 110-115.  Given her significantly improved vital signs, her overall well appearance, and the lack of other concerning lab findings such as elevated creatinine, she is safe for discharge at this time.  I will prescribe Keflex for patient to start tomorrow, and I instructed her to stay hydrated, and gave thorough return precautions.  Patient understands that pyelonephritis can get worse and cause generalized infection and sepsis, and she expressed understanding of the return precautions.  ____________________________________________   FINAL CLINICAL IMPRESSION(S) / ED DIAGNOSES  Final diagnoses:  Pyelonephritis      NEW MEDICATIONS STARTED DURING THIS VISIT:  New Prescriptions   No medications on file     Note:  This document was prepared using Dragon voice recognition software and may include unintentional dictation errors.    Dionne Bucy, MD 10/11/17 [id number]

## 2017-10-11 NOTE — ED Triage Notes (Addendum)
Pt in via POV, states, "I have flu-like symptoms."  Pt reports generalized body aches, fever starting yesterday.  Pt also reports acute onset right flank pain this morning, denies any urinary symptoms, denies any N/V.  Pt febrile upon arrival, NAD noted at this time.

## 2017-10-11 NOTE — ED Notes (Signed)
Pt attempted to provide urine specimen, unable to go at this time.  Labeled specimen cup left with patient.

## 2017-10-14 LAB — URINE CULTURE

## 2017-10-15 NOTE — Progress Notes (Signed)
ED CULTURE REPORT   57 yo female presented to the ED on 2/17 with c/o right flank pain and fever. A urine culture was obtained and the patient received one dose of IV Rocephin 1g. She was discharged with a prescription for cephalexin 500mg  BID x 10 days. Her urine cultures resulted on 2/21 showing Enterobacter aerogenes resistant to cefazolin but sensitive to Bactrim and ciprofloxacin. I discussed the case with ED MD Dr. Cyril LoosenKinner who agreed that the patient needed different antibiotics. Due to NKDA, we decided to change antibiotics to Bactrim DS 800/160mg  BID x 14 days. I called the patient to discuss the change in the therapy. I instructed the patient to stop taking cephalexin and to begin taking Bactrim. I called her prescription into her preferred pharmacy: [facility] on [address]   Results for orders placed or performed during the hospital encounter of 10/11/17  Urine C&S     Status: Abnormal   Collection Time: 10/11/17 10:42 AM  Result Value Ref Range Status   Specimen Description   Final    URINE, CLEAN CATCH Performed at [facility], [address] [place], [place] [address]    Special Requests   Final    NONE Performed at [facility], [address] [place], [place] [address]    Culture >=100,000 COLONIES/mL ENTEROBACTER AEROGENES (A)  Final   Report Status 10/14/2017 FINAL  Final   Organism ID, Bacteria ENTEROBACTER AEROGENES (A)  Final      Susceptibility   Enterobacter aerogenes - MIC*    CEFAZOLIN >=64 RESISTANT Resistant     CEFTRIAXONE <=1 SENSITIVE Sensitive     CIPROFLOXACIN <=0.25 SENSITIVE Sensitive     GENTAMICIN <=1 SENSITIVE Sensitive     IMIPENEM 0.5 SENSITIVE Sensitive     NITROFURANTOIN 256 RESISTANT Resistant     TRIMETH/SULFA <=20 SENSITIVE Sensitive     PIP/TAZO <=4 SENSITIVE Sensitive     * >=100,000 COLONIES/mL ENTEROBACTER AEROGENES   Yolanda BonineHannah Lifsey, PharmD Pharmacy Resident

## 2019-05-24 ENCOUNTER — Encounter: Payer: Self-pay | Admitting: Intensive Care

## 2019-05-24 ENCOUNTER — Emergency Department
Admission: EM | Admit: 2019-05-24 | Discharge: 2019-05-24 | Disposition: A | Discharge status: Home / Self Care | Payer: BC Managed Care – PPO | Attending: Emergency Medicine | Admitting: Emergency Medicine

## 2019-05-24 ENCOUNTER — Other Ambulatory Visit: Payer: Self-pay

## 2019-05-24 DIAGNOSIS — I10 Essential (primary) hypertension: Secondary | ICD-10-CM | POA: Insufficient documentation

## 2019-05-24 DIAGNOSIS — Z79899 Other long term (current) drug therapy: Secondary | ICD-10-CM | POA: Insufficient documentation

## 2019-05-24 DIAGNOSIS — F17210 Nicotine dependence, cigarettes, uncomplicated: Secondary | ICD-10-CM | POA: Insufficient documentation

## 2019-05-24 DIAGNOSIS — J029 Acute pharyngitis, unspecified: Secondary | ICD-10-CM | POA: Diagnosis not present

## 2019-05-24 HISTORY — DX: Essential (primary) hypertension: I10

## 2019-05-24 HISTORY — DX: Hyperlipidemia, unspecified: E78.5

## 2019-05-24 HISTORY — DX: Depression, unspecified: F32.A

## 2019-05-24 HISTORY — DX: Sickle-cell trait: D57.3

## 2019-05-24 LAB — GROUP A STREP BY PCR: Group A Strep by PCR: NOT DETECTED

## 2019-05-24 MED ORDER — MAGIC MOUTHWASH W/LIDOCAINE: 5.0000 mL | Freq: Four times a day (QID) | ORAL | 0 refills | Status: DC

## 2019-05-24 MED ORDER — ACETAMINOPHEN 325 MG PO TABS
650.0000 mg | ORAL_TABLET | Freq: Once | ORAL | Status: AC | PRN
  Administered 2019-05-24: 650 mg via ORAL
  Filled 2019-05-24: qty 2

## 2019-05-24 NOTE — ED Provider Notes (Signed)
[facility] Emergency Department Provider Note  ____________________________________________  Time seen: Approximately 4:28 PM  I have reviewed the triage vital signs and the nursing notes.   HISTORY  Chief Complaint Sore Throat    HPI Penny Gill is a 58 y.o. female who presents the emergency department for evaluation of sore throat.  Patient states that symptoms began today.  She has had no other associated symptoms of fevers and chills, nasal congestion, cough, shortness of breath, abdominal pain, nausea vomiting.  Patient's husband had a sore throat earlier this week and was diagnosed with a virus.  Patient reports that he is doing better but now she is having sore throat.  No medications prior to arrival.         Past Medical History:  Diagnosis Date  . Depression   . Hyperlipidemia   . Hypertension   . Kidney stone   . Sickle cell trait (HCC)     There are no active problems to display for this patient.   Past Surgical History:  Procedure Laterality Date  . TUBAL LIGATION      Prior to Admission medications   Medication Sig Start Date End Date Taking? Authorizing Provider  amLODipine (NORVASC) 5 MG tablet Take 5 mg by mouth daily. 10/02/17   [provider]  atorvastatin (LIPITOR) 20 MG tablet Take 20 mg by mouth at bedtime. 10/02/17   [provider]  cyclobenzaprine (FLEXERIL) 10 MG tablet Take 10 mg by mouth 3 (three) times daily as needed. 10/01/17   [provider]  ibuprofen (ADVIL,MOTRIN) 600 MG tablet Take 1 tablet (600 mg total) by mouth every 6 (six) hours as needed. 10/11/17   Dionne Bucy, MD  lidocaine (XYLOCAINE) 2 % solution Use as directed 20 mLs in the mouth or throat as needed for mouth pain. Patient not taking: Reported on 10/11/2017 09/11/15   Christella Scheuermann, PA-C  magic mouthwash w/lidocaine SOLN Take 5 mLs by mouth 4 (four) times daily. 05/24/19   , Delorise Royals, PA-C  meloxicam  (MOBIC) 15 MG tablet Take 15 mg by mouth daily. 09/24/17   [provider]  mirtazapine (REMERON) 30 MG tablet Take 30 mg by mouth at bedtime. 09/24/17   [provider]  nortriptyline (PAMELOR) 25 MG capsule Take 25 mg by mouth at bedtime. 09/24/17   [provider]  oxyCODONE-acetaminophen (ROXICET) 5-325 MG tablet Take 1 tablet by mouth every 8 (eight) hours as needed for moderate pain or severe pain (Do not drive or operate heavy machinery while taking as can cause drowsiness.). Patient not taking: Reported on 10/11/2017 03/25/16   Renford Dills, NP  Prenatal Vit-Fe Fumarate-FA (PREPLUS) 27-1 MG TABS Take 1 tablet by mouth daily. 09/30/17   [provider]  traZODone (DESYREL) 150 MG tablet Take 150 mg by mouth at bedtime. 09/24/17   [provider]    Allergies Patient has no known allergies.  Family History  Problem Relation Age of Onset  . Breast cancer Maternal Aunt 87    Social History Social History   Tobacco Use  . Smoking status: Current Every Day Smoker    Packs/day: 0.50    Types: Cigarettes  . Smokeless tobacco: Never Used  Substance Use Topics  . Alcohol use: Yes    Comment: rare  . Drug use: Yes    Types: Marijuana     Review of Systems  Constitutional: No fever/chills Eyes: No visual changes. No discharge ENT: Positive for sore throat Cardiovascular:  no chest pain. Respiratory: no cough. No SOB. Gastrointestinal: No abdominal pain.  No nausea, no vomiting.  No diarrhea.  No constipation. Musculoskeletal: Negative for musculoskeletal pain. Skin: Negative for rash, abrasions, lacerations, ecchymosis. Neurological: Negative for headaches, focal weakness or numbness. 10-point ROS otherwise negative.  ____________________________________________   PHYSICAL EXAM:  VITAL SIGNS: ED Triage Vitals [05/24/19 1525]  Enc Vitals Group     BP (!) 136/91     Pulse Rate 96     Resp 16     Temp 98.3 F (36.8 C)     Temp  Source Oral     SpO2 98 %     Weight 91 lb (41.3 kg)     Height 5' (1.524 m)     Head Circumference      Peak Flow      Pain Score 10     Pain Loc      Pain Edu?      Excl. in [place]?      Constitutional: Alert and oriented. Well appearing and in no acute distress. Eyes: Conjunctivae are normal. PERRL. EOMI. Head: Atraumatic. ENT:      Ears:       Nose: No congestion/rhinnorhea.      Mouth/Throat: Mucous membranes are moist.  Tonsils are mildly erythematous and edematous bilaterally.  Uvula is midline.  No exudates identified bilaterally.  No other oropharyngeal erythema or edema.  Sublingual space is soft.  No tenderness, erythema or edema to the anterior neck Neck: No stridor.  Neck is supple full range of motion Hematological/Lymphatic/Immunilogical: No cervical lymphadenopathy. Cardiovascular: Normal rate, regular rhythm. Normal S1 and S2.  Good peripheral circulation. Respiratory: Normal respiratory effort without tachypnea or retractions. Lungs CTAB. Good air entry to the bases with no decreased or absent breath sounds. Musculoskeletal: Full range of motion to all extremities. No gross deformities appreciated. Neurologic:  Normal speech and language. No gross focal neurologic deficits are appreciated.  Skin:  Skin is warm, dry and intact. No rash noted. Psychiatric: Mood and affect are normal. Speech and behavior are normal. Patient exhibits appropriate insight and judgement.   ____________________________________________   LABS (all labs ordered are listed, but only abnormal results are displayed)  Labs Reviewed  GROUP A STREP BY PCR   ____________________________________________  EKG   ____________________________________________  RADIOLOGY   No results found.  ____________________________________________    PROCEDURES  Procedure(s) performed:    Procedures    Medications  acetaminophen (TYLENOL) tablet 650 mg (650 mg Oral Given 05/24/19 1602)      ____________________________________________   INITIAL IMPRESSION / ASSESSMENT AND PLAN / ED COURSE  Pertinent labs & imaging results that were available during my care of the patient were reviewed by me and considered in my medical decision making (see chart for details).  Review of the [place] CSRS was performed in accordance of the [place] prior to dispensing any controlled drugs.         The patient was evaluated for the symptoms described in the history of present illness. The patient was evaluated in the context of the global COVID-19 pandemic, which necessitated consideration that the patient might be at risk for infection with the SARS-CoV-2 virus that causes COVID-19. Institutional protocols and algorithms that pertain to the evaluation of patients at risk for COVID-19 are in a state of rapid change based on information released by regulatory bodies including the CDC and federal and state organizations. The most current policies and algorithms were followed during the patient's care  in the ED.    Patient's diagnosis is consistent with viral pharyngitis.  Patient presented to the emergency department complaining of sore throat with no other accompanying symptoms.  Patient's husband had been diagnosed with viral illness with a sore throat earlier this week.  Differential included viral pharyngitis, viral URI, strep pharyngitis, COVID-19.  At this time patient has a negative strep test.  I will not pursue COVID-19 testing at this time.  Patient will be prescribed Magic mouthwash for symptom control.  Tylenol and/or Motrin at home as needed for additional relief.  Patient is advised that she may develop other symptoms to include nasal congestion, fevers or chills, cough.  If patient feels that symptoms warrant she may always return for further evaluation otherwise continue medications prescribed and Tylenol and/or Motrin at home.  Follow-up primary care as needed.. Patient is given ED precautions  to return to the ED for any worsening or new symptoms.     ____________________________________________  FINAL CLINICAL IMPRESSION(S) / ED DIAGNOSES  Final diagnoses:  Viral pharyngitis      NEW MEDICATIONS STARTED DURING THIS VISIT:  ED Discharge Orders         Ordered    magic mouthwash w/lidocaine SOLN  4 times daily    Note to Pharmacy: Dispense in a 1/1/1 ratio. Use lidocaine, diphenhydramine, prednisolone   05/24/19 1813              This chart was dictated using voice recognition software/Dragon. Despite best efforts to proofread, errors can occur which can change the meaning. Any change was purely unintentional.    Racheal PatchesCuthriell,  D, PA-C 05/24/19 1813    Phineas SemenGoodman, [place], MD 05/24/19 [phone]

## 2019-05-24 NOTE — ED Triage Notes (Signed)
Patient c/o sore throat since this AM. Reports she has been able to eat and drink today. Speaking in triage with no problems.

## 2019-06-27 DIAGNOSIS — M7061 Trochanteric bursitis, right hip: Secondary | ICD-10-CM | POA: Insufficient documentation

## 2019-11-27 ENCOUNTER — Inpatient Hospital Stay
Admission: EM | Admit: 2019-11-27 | Discharge: 2019-11-29 | DRG: 872 | Disposition: A | Discharge status: Home / Self Care | Payer: BLUE CROSS/BLUE SHIELD | Attending: Hospitalist | Admitting: Hospitalist

## 2019-11-27 ENCOUNTER — Encounter: Payer: Self-pay | Admitting: Emergency Medicine

## 2019-11-27 ENCOUNTER — Emergency Department: Payer: BLUE CROSS/BLUE SHIELD

## 2019-11-27 ENCOUNTER — Other Ambulatory Visit: Payer: Self-pay

## 2019-11-27 DIAGNOSIS — E876 Hypokalemia: Secondary | ICD-10-CM | POA: Diagnosis present

## 2019-11-27 DIAGNOSIS — F32A Depression, unspecified: Secondary | ICD-10-CM | POA: Diagnosis present

## 2019-11-27 DIAGNOSIS — A419 Sepsis, unspecified organism: Secondary | ICD-10-CM | POA: Diagnosis present

## 2019-11-27 DIAGNOSIS — Z79899 Other long term (current) drug therapy: Secondary | ICD-10-CM

## 2019-11-27 DIAGNOSIS — R197 Diarrhea, unspecified: Secondary | ICD-10-CM | POA: Diagnosis present

## 2019-11-27 DIAGNOSIS — F17210 Nicotine dependence, cigarettes, uncomplicated: Secondary | ICD-10-CM | POA: Diagnosis present

## 2019-11-27 DIAGNOSIS — Z87442 Personal history of urinary calculi: Secondary | ICD-10-CM | POA: Diagnosis not present

## 2019-11-27 DIAGNOSIS — D573 Sickle-cell trait: Secondary | ICD-10-CM | POA: Diagnosis present

## 2019-11-27 DIAGNOSIS — Z681 Body mass index (BMI) 19 or less, adult: Secondary | ICD-10-CM

## 2019-11-27 DIAGNOSIS — N200 Calculus of kidney: Secondary | ICD-10-CM

## 2019-11-27 DIAGNOSIS — E440 Moderate protein-calorie malnutrition: Secondary | ICD-10-CM | POA: Diagnosis present

## 2019-11-27 DIAGNOSIS — I10 Essential (primary) hypertension: Secondary | ICD-10-CM | POA: Diagnosis present

## 2019-11-27 DIAGNOSIS — N3090 Cystitis, unspecified without hematuria: Secondary | ICD-10-CM | POA: Diagnosis present

## 2019-11-27 DIAGNOSIS — E785 Hyperlipidemia, unspecified: Secondary | ICD-10-CM | POA: Diagnosis present

## 2019-11-27 DIAGNOSIS — N390 Urinary tract infection, site not specified: Secondary | ICD-10-CM | POA: Diagnosis not present

## 2019-11-27 DIAGNOSIS — A4159 Other Gram-negative sepsis: Secondary | ICD-10-CM | POA: Diagnosis present

## 2019-11-27 DIAGNOSIS — F329 Major depressive disorder, single episode, unspecified: Secondary | ICD-10-CM | POA: Diagnosis present

## 2019-11-27 DIAGNOSIS — N202 Calculus of kidney with calculus of ureter: Secondary | ICD-10-CM | POA: Diagnosis present

## 2019-11-27 DIAGNOSIS — N10 Acute tubulo-interstitial nephritis: Secondary | ICD-10-CM | POA: Diagnosis present

## 2019-11-27 DIAGNOSIS — Z20822 Contact with and (suspected) exposure to covid-19: Secondary | ICD-10-CM | POA: Diagnosis present

## 2019-11-27 LAB — CBC WITH DIFFERENTIAL/PLATELET
Abs Immature Granulocytes: 0.13 10*3/uL — ABNORMAL HIGH (ref 0.00–0.07)
Basophils Absolute: 0.1 10*3/uL (ref 0.0–0.1)
Basophils Relative: 0 %
Eosinophils Absolute: 0 10*3/uL (ref 0.0–0.5)
Eosinophils Relative: 0 %
HCT: 41.3 % (ref 36.0–46.0)
Hemoglobin: 14.1 g/dL (ref 12.0–15.0)
Immature Granulocytes: 1 %
Lymphocytes Relative: 7 %
Lymphs Abs: 1.6 10*3/uL (ref 0.7–4.0)
MCH: 30.1 pg (ref 26.0–34.0)
MCHC: 34.1 g/dL (ref 30.0–36.0)
MCV: 88.1 fL (ref 80.0–100.0)
Monocytes Absolute: 1.3 10*3/uL — ABNORMAL HIGH (ref 0.1–1.0)
Monocytes Relative: 6 %
Neutro Abs: 20 10*3/uL — ABNORMAL HIGH (ref 1.7–7.7)
Neutrophils Relative %: 86 %
Platelets: 291 10*3/uL (ref 150–400)
RBC: 4.69 MIL/uL (ref 3.87–5.11)
RDW: 12.8 % (ref 11.5–15.5)
WBC: 23.1 10*3/uL — ABNORMAL HIGH (ref 4.0–10.5)
nRBC: 0 % (ref 0.0–0.2)

## 2019-11-27 LAB — URINALYSIS, COMPLETE (UACMP) WITH MICROSCOPIC
Bilirubin Urine: NEGATIVE
Glucose, UA: 50 mg/dL — AB
Ketones, ur: NEGATIVE mg/dL
Nitrite: NEGATIVE
Protein, ur: 100 mg/dL — AB
Specific Gravity, Urine: 1.011 (ref 1.005–1.030)
WBC, UA: >50 WBC/hpf — ABNORMAL HIGH (ref 0–5)
pH: 5 (ref 5.0–8.0)

## 2019-11-27 LAB — BASIC METABOLIC PANEL
Anion gap: 10 (ref 5–15)
BUN: 17 mg/dL (ref 6–20)
CO2: 25 mmol/L (ref 22–32)
Calcium: 8.7 mg/dL — ABNORMAL LOW (ref 8.9–10.3)
Chloride: 98 mmol/L (ref 98–111)
Creatinine, Ser: 0.97 mg/dL (ref 0.44–1.00)
GFR calc Af Amer: >60 mL/min (ref >60)
GFR calc non Af Amer: >60 mL/min (ref >60)
Glucose, Bld: 215 mg/dL — ABNORMAL HIGH (ref 70–99)
Potassium: 3.2 mmol/L — ABNORMAL LOW (ref 3.5–5.1)
Sodium: 133 mmol/L — ABNORMAL LOW (ref 135–145)

## 2019-11-27 LAB — SARS CORONAVIRUS 2 (TAT 6-24 HRS): SARS Coronavirus 2: NEGATIVE

## 2019-11-27 LAB — LACTIC ACID, PLASMA: Lactic Acid, Venous: 1.6 mmol/L (ref 0.5–1.9)

## 2019-11-27 MED ORDER — HYDROMORPHONE HCL 1 MG/ML IJ SOLN: 0.5000 mg | Freq: Once | INTRAMUSCULAR | Status: AC

## 2019-11-27 MED ORDER — SODIUM CHLORIDE 0.9 % IV BOLUS (SEPSIS)
500.0000 mL | Freq: Once | INTRAVENOUS | Status: AC
  Administered 2019-11-27: 15:00:00 500 mL via INTRAVENOUS

## 2019-11-27 MED ORDER — ENOXAPARIN SODIUM 30 MG/0.3ML ~~LOC~~ SOLN
30.0000 mg | SUBCUTANEOUS | Status: DC
  Administered 2019-11-28: 30 mg via SUBCUTANEOUS
  Filled 2019-11-27: qty 0.3

## 2019-11-27 MED ORDER — ONDANSETRON HCL 4 MG/2ML IJ SOLN
4.0000 mg | Freq: Once | INTRAMUSCULAR | Status: AC
  Administered 2019-11-27: 14:00:00 4 mg via INTRAVENOUS
  Filled 2019-11-27: qty 2

## 2019-11-27 MED ORDER — POTASSIUM CHLORIDE IN NACL 40-0.9 MEQ/L-% IV SOLN
INTRAVENOUS | Status: DC
  Administered 2019-11-27 – 2019-11-28 (×4): 125 mL/h via INTRAVENOUS
  Administered 2019-11-29: 04:00:00 100 mL/h via INTRAVENOUS
  Filled 2019-11-27 (×7): qty 1000

## 2019-11-27 MED ORDER — ACETAMINOPHEN 650 MG RE SUPP: 650.0000 mg | Freq: Four times a day (QID) | RECTAL | Status: DC | PRN

## 2019-11-27 MED ORDER — PRENATAL PLUS 27-1 MG PO TABS
1.0000 | ORAL_TABLET | Freq: Every day | ORAL | Status: DC
  Administered 2019-11-27 – 2019-11-29 (×3): 1 via ORAL
  Filled 2019-11-27 (×3): qty 1

## 2019-11-27 MED ORDER — ENOXAPARIN SODIUM 40 MG/0.4ML ~~LOC~~ SOLN
40.0000 mg | SUBCUTANEOUS | Status: DC
  Administered 2019-11-27: 16:00:00 40 mg via SUBCUTANEOUS
  Filled 2019-11-27: qty 0.4

## 2019-11-27 MED ORDER — ONDANSETRON HCL 4 MG/2ML IJ SOLN: 4.0000 mg | Freq: Four times a day (QID) | INTRAMUSCULAR | Status: DC | PRN

## 2019-11-27 MED ORDER — SODIUM CHLORIDE 0.9 % IV BOLUS (SEPSIS)
1000.0000 mL | Freq: Once | INTRAVENOUS | Status: AC
  Administered 2019-11-27: 1000 mL via INTRAVENOUS

## 2019-11-27 MED ORDER — SODIUM CHLORIDE 0.9 % IV SOLN
1.0000 g | INTRAVENOUS | Status: DC
  Administered 2019-11-27 – 2019-11-29 (×3): 1 g via INTRAVENOUS
  Filled 2019-11-27 (×3): qty 10

## 2019-11-27 MED ORDER — HYDROMORPHONE HCL 1 MG/ML IJ SOLN
INTRAMUSCULAR | Status: AC
  Administered 2019-11-27: 0.5 mg via INTRAVENOUS
  Filled 2019-11-27: qty 1

## 2019-11-27 MED ORDER — ACETAMINOPHEN 325 MG PO TABS
650.0000 mg | ORAL_TABLET | Freq: Four times a day (QID) | ORAL | Status: DC | PRN
  Administered 2019-11-27 – 2019-11-28 (×2): 650 mg via ORAL
  Filled 2019-11-27 (×2): qty 2

## 2019-11-27 MED ORDER — ENSURE ACTIVE HIGH PROTEIN PO LIQD: 237.0000 mL | Freq: Three times a day (TID) | ORAL | Status: DC

## 2019-11-27 MED ORDER — ONDANSETRON HCL 4 MG PO TABS: 4.0000 mg | ORAL_TABLET | Freq: Four times a day (QID) | ORAL | Status: DC | PRN

## 2019-11-27 MED ORDER — ATORVASTATIN CALCIUM 20 MG PO TABS
20.0000 mg | ORAL_TABLET | Freq: Every day | ORAL | Status: DC
  Administered 2019-11-27 – 2019-11-28 (×2): 20 mg via ORAL
  Filled 2019-11-27 (×2): qty 1

## 2019-11-27 MED ORDER — GABAPENTIN 100 MG PO CAPS
100.0000 mg | ORAL_CAPSULE | Freq: Three times a day (TID) | ORAL | Status: DC
  Administered 2019-11-27 – 2019-11-29 (×6): 100 mg via ORAL
  Filled 2019-11-27 (×6): qty 1

## 2019-11-27 NOTE — H&P (Signed)
History and Physical    Penny Gill BJS:[id number] DOB: September 06, 1960 DOA: 11/27/2019  PCP: Sharyne Peach, MD   Patient coming from: Home  I have personally briefly reviewed patient's old medical records in [place]  Chief Complaint: Dysuria  HPI: Penny Gill is a 59 y.o. female with medical history significant for hypertension and nephrolithiasis who presents to the emergency room for evaluation of painful urination as well as right flank pain.  Right flank pain has been constant, non radiating, rated a 7 x 10 in intensity at its worst associated with nausea but no vomiting.  She had a fever and chills at home but did not document her temperature. She denies having any changes in her bowel habits, no cough, no fever, no headache, no chest pain, no shortness of breath, no dizziness or lightheadedness She had a CT renal which showed no evidence of obstructive uropathy. 4 mm calculus in the left pelvis is difficult to determine whether it represents distal ureteral calculus or a phlebolith. No evidence of hydroureter. Bilateral nephrolithiasis with calculi measuring up to 8 mm.   ED Course: Patient is a 59 year old who comes in tachycardic, with dysuria and right flank pain.  Given history of kidney stones, CT renal was done to make sure no evidence of infected kidney stone.  Patient met SIRS criteria with tachycardia and low grade fever. She has an elevated white count and pyuria and received a dose of ceftriaxone. She received sepsis fluid per protocol. She will be admitted to the hospital for sepsis secondary to UTI   Review of Systems: As per HPI otherwise 10 point review of systems negative.    Past Medical History:  Diagnosis Date  . Depression   . Hyperlipidemia   . Hypertension   . Kidney stone   . Sickle cell trait [facility])     Past Surgical History:  Procedure Laterality Date  . TUBAL LIGATION       reports that she has been smoking cigarettes. She has been  smoking about 0.50 packs per day. She has never used smokeless tobacco. She reports current alcohol use. She reports current drug use. Drug: Marijuana.  No Known Allergies  Family History  Problem Relation Age of Onset  . Breast cancer Maternal Aunt 40     Prior to Admission medications   Medication Sig Start Date End Date Taking? Authorizing Provider  amLODipine (NORVASC) 5 MG tablet Take 5 mg by mouth daily. 10/02/17  Yes [provider]  atorvastatin (LIPITOR) 20 MG tablet Take 20 mg by mouth at bedtime. 10/02/17  Yes [provider]  diclofenac (VOLTAREN) 50 MG EC tablet Take 50 mg by mouth 3 (three) times daily. 11/04/19  Yes [provider]  gabapentin (NEURONTIN) 100 MG capsule Take 100 mg by mouth 3 (three) times daily. 11/04/19  Yes [provider]  Nutritional Supplements (ENSURE ACTIVE HIGH PROTEIN) LIQD Take 237 mLs by mouth in the morning and at bedtime. 05/08/19 05/07/20 Yes [provider]  Prenatal Vit-Fe Fumarate-FA (PREPLUS) 27-1 MG TABS Take 1 tablet by mouth daily. 09/30/17  Yes [provider]    Physical Exam: Vitals:   11/27/19 1141 11/27/19 1400 11/27/19 1430 11/27/19 1500  BP:  118/81 123/87 (!) 142/93  Pulse:  (!) 106 (!) 106 (!) 110  Resp:  (!) 25 16 (!) 21  Temp:      TempSrc:      SpO2:  96% 98% 97%  Weight: 43.1 kg  Height: 5' (1.524 m)        Vitals:   11/27/19 1141 11/27/19 1400 11/27/19 1430 11/27/19 1500  BP:  118/81 123/87 (!) 142/93  Pulse:  (!) 106 (!) 106 (!) 110  Resp:  (!) 25 16 (!) 21  Temp:      TempSrc:      SpO2:  96% 98% 97%  Weight: 43.1 kg     Height: 5' (1.524 m)       Constitutional: NAD, alert and oriented x 3, acutely ill appearing Eyes: PERRL, lids and conjunctivae normal ENMT: Mucous membranes are moist.  Neck: normal, supple, no masses, no thyromegaly Respiratory: clear to auscultation bilaterally, no wheezing, no crackles. Normal respiratory effort. No accessory  muscle use.  Cardiovascular: Tachycardia, no murmurs / rubs / gallops. No extremity edema. 2+ pedal pulses. No carotid bruits.  Abdomen: Rt CVA tenderness, no masses palpated. No hepatosplenomegaly. Bowel sounds positive.  Musculoskeletal: no clubbing / cyanosis. No joint deformity upper and lower extremities.  Skin: no rashes, lesions, ulcers.  Neurologic: No gross focal neurologic deficit. Psychiatric: Normal mood and affect.   Labs on Admission: I have personally reviewed following labs and imaging studies  CBC: Recent Labs  Lab 11/27/19 1144  WBC 23.1*  NEUTROABS 20.0*  HGB 14.1  HCT 41.3  MCV 88.1  PLT 937   Basic Metabolic Panel: Recent Labs  Lab 11/27/19 1144  NA 133*  K 3.2*  CL 98  CO2 25  GLUCOSE 215*  BUN 17  CREATININE 0.97  CALCIUM 8.7*   GFR: Estimated Creatinine Clearance: 43 mL/min (by C-G formula based on SCr of 0.97 mg/dL). Liver Function Tests: No results for input(s): AST, ALT, ALKPHOS, BILITOT, PROT, ALBUMIN in the last 168 hours. No results for input(s): LIPASE, AMYLASE in the last 168 hours. No results for input(s): AMMONIA in the last 168 hours. Coagulation Profile: No results for input(s): INR, PROTIME in the last 168 hours. Cardiac Enzymes: No results for input(s): CKTOTAL, CKMB, CKMBINDEX, TROPONINI in the last 168 hours. BNP (last 3 results) No results for input(s): PROBNP in the last 8760 hours. HbA1C: No results for input(s): HGBA1C in the last 72 hours. CBG: No results for input(s): GLUCAP in the last 168 hours. Lipid Profile: No results for input(s): CHOL, HDL, LDLCALC, TRIG, CHOLHDL, LDLDIRECT in the last 72 hours. Thyroid Function Tests: No results for input(s): TSH, T4TOTAL, FREET4, T3FREE, THYROIDAB in the last 72 hours. Anemia Panel: No results for input(s): VITAMINB12, FOLATE, FERRITIN, TIBC, IRON, RETICCTPCT in the last 72 hours. Urine analysis:    Component Value Date/Time   COLORURINE AMBER (A) 11/27/2019 1144    APPEARANCEUR TURBID (A) 11/27/2019 1144   APPEARANCEUR Hazy 07/05/2014 1255   LABSPEC 1.011 11/27/2019 1144   LABSPEC 1.011 07/05/2014 1255   PHURINE 5.0 11/27/2019 1144   GLUCOSEU 50 (A) 11/27/2019 1144   GLUCOSEU Negative 07/05/2014 1255   HGBUR MODERATE (A) 11/27/2019 1144   BILIRUBINUR NEGATIVE 11/27/2019 1144   BILIRUBINUR Negative 07/05/2014 1255   KETONESUR NEGATIVE 11/27/2019 1144   PROTEINUR 100 (A) 11/27/2019 1144   NITRITE NEGATIVE 11/27/2019 1144   LEUKOCYTESUR LARGE (A) 11/27/2019 1144   LEUKOCYTESUR 1+ 07/05/2014 1255    Radiological Exams on Admission: CT Renal Stone Study  Result Date: 11/27/2019 CLINICAL DATA:  Dysuria and fever. EXAM: CT ABDOMEN AND PELVIS WITHOUT CONTRAST TECHNIQUE: Multidetector CT imaging of the abdomen and pelvis was performed following the standard protocol without IV contrast. COMPARISON:  None. FINDINGS: Lower chest: No  acute abnormality. Hepatobiliary: No focal liver abnormality is seen. No gallstones, gallbladder wall thickening, or biliary dilatation. Pancreas: Unremarkable. No pancreatic ductal dilatation or surrounding inflammatory changes. Spleen: Normal in size without focal abnormality. Adrenals/Urinary Tract: Normal adrenal glands. No evidence of hydronephrosis. Several bilateral bulky calculi within the kidneys. The ureters are not well visualized but there is no gross evidence of hydroureter. The urinary bladder appears normal. Stomach/Bowel: Stomach is within normal limits. No evidence of bowel wall thickening, distention, or inflammatory changes. Vascular/Lymphatic: Aortic atherosclerosis. No enlarged abdominal or pelvic lymph nodes. Reproductive: Bulky uterus with multiple myometrial masses. Other: No abdominal wall hernia or abnormality. No abdominopelvic ascites. Musculoskeletal: Posterior facet arthropathy in the lower lumbosacral spine. IMPRESSION: 1. No evidence of obstructive uropathy. 2. 4 mm calculus in the left pelvis is difficult to  determine whether it represents distal ureteral calculus or a phlebolith. No evidence of hydroureter. 3. Bilateral nephrolithiasis with calculi measuring up to 8 mm. 4. Bulky uterus with multiple myometrial masses, likely representing fibroids. Aortic Atherosclerosis, advanced for age.  (ICD10-I70.0). Electronically Signed   By: Fidela Salisbury M.D.   On: 11/27/2019 13:36    EKG: Independently reviewed.   Assessment/Plan Principal Problem:   Sepsis secondary to UTI [facility]) Active Problems:   Essential hypertension   Hypokalemia   Sepsis ([place])   Acute pyelonephritis    Sepsis from a urinary source possible pyelonephritis Patient with a known history of nephrolithiasis who presents to the emergency room for evaluation of right flank pain associated with dysuria and fever. She has right CVA tenderness She had a low-grade temp in the ER with T max of 99.24F, she was tachycardic, she had marked leukocytosis with a left shift and pyuria. Lactic acid was normal. She received her sepsis fluid requirement Prior urine culture yielded Enterobacter aerogenes sensitive to cephalosporins Will place patient on Rocephin 1 g IV daily Continue IV fluid resuscitation   Hypokalemia Supplement potassium   Hypertension Hold amlodipine as patient is normotensive   Protein calorie malnutrition Continue nutritional supplements Dietary consult   DVT prophylaxis: Lovenox Code Status: Full Family Communication: Plan of care was discussed with patient at the bedside.  She Verbalizes understanding and agrees with the plan Disposition Plan: Back to previous home environment Consults called: None    Willow Reczek MD [facility]     11/27/2019, 3:41 PM

## 2019-11-27 NOTE — ED Provider Notes (Signed)
[facility] Emergency Department Provider Note  ____________________________________________   First MD Initiated Contact with Patient 11/27/19 1216     (approximate)  I have reviewed the triage vital signs and the nursing notes.   HISTORY  Chief Complaint Dysuria    HPI Penny Gill is a 59 y.o. female with prior kidney stone, hypertension, hyperlipidemia who comes in with concerns for dysuria..  Patient had dysuria x1 day.  She also reports pain in her right flank.  Patient states that she does have a kidney stone.  Pain has been constant, 1 day, nothing makes it better, nothing makes it worse.  Denies any pain in her abdomen.  Has not had any vomiting.          Past Medical History:  Diagnosis Date  . Depression   . Hyperlipidemia   . Hypertension   . Kidney stone   . Sickle cell trait ([facility])     There are no problems to display for this patient.   Past Surgical History:  Procedure Laterality Date  . TUBAL LIGATION      Prior to Admission medications   Medication Sig Start Date End Date Taking? Authorizing Provider  amLODipine (NORVASC) 5 MG tablet Take 5 mg by mouth daily. 10/02/17   [provider]  atorvastatin (LIPITOR) 20 MG tablet Take 20 mg by mouth at bedtime. 10/02/17   [provider]  cyclobenzaprine (FLEXERIL) 10 MG tablet Take 10 mg by mouth 3 (three) times daily as needed. 10/01/17   [provider]  ibuprofen (ADVIL,MOTRIN) 600 MG tablet Take 1 tablet (600 mg total) by mouth every 6 (six) hours as needed. 10/11/17   Dionne Bucy, MD  lidocaine (XYLOCAINE) 2 % solution Use as directed 20 mLs in the mouth or throat as needed for mouth pain. Patient not taking: Reported on 10/11/2017 09/11/15   Christella Scheuermann, PA-C  magic mouthwash w/lidocaine SOLN Take 5 mLs by mouth 4 (four) times daily. 05/24/19   Cuthriell, Delorise Royals, PA-C  meloxicam (MOBIC) 15 MG tablet Take 15 mg by mouth daily. 09/24/17    [provider]  mirtazapine (REMERON) 30 MG tablet Take 30 mg by mouth at bedtime. 09/24/17   [provider]  nortriptyline (PAMELOR) 25 MG capsule Take 25 mg by mouth at bedtime. 09/24/17   [provider]  oxyCODONE-acetaminophen (ROXICET) 5-325 MG tablet Take 1 tablet by mouth every 8 (eight) hours as needed for moderate pain or severe pain (Do not drive or operate heavy machinery while taking as can cause drowsiness.). Patient not taking: Reported on 10/11/2017 03/25/16   Renford Dills, NP  Prenatal Vit-Fe Fumarate-FA (PREPLUS) 27-1 MG TABS Take 1 tablet by mouth daily. 09/30/17   [provider]  traZODone (DESYREL) 150 MG tablet Take 150 mg by mouth at bedtime. 09/24/17   [provider]    Allergies Patient has no known allergies.  Family History  Problem Relation Age of Onset  . Breast cancer Maternal Aunt 81    Social History Social History   Tobacco Use  . Smoking status: Current Every Day Smoker    Packs/day: 0.50    Types: Cigarettes  . Smokeless tobacco: Never Used  Substance Use Topics  . Alcohol use: Yes    Comment: rare  . Drug use: Yes    Types: Marijuana      Review of Systems Constitutional: Positive fevers Eyes: No visual changes. ENT: No sore throat. Cardiovascular: Denies chest pain. Respiratory:  Denies shortness of breath. Gastrointestinal: No abdominal pain.  No nausea, no vomiting.  No diarrhea.  No constipation. Genitourinary: Positive dysuria Musculoskeletal: Right flank pain Skin: Negative for rash. Neurological: Negative for headaches, focal weakness or numbness. All other ROS negative ____________________________________________   PHYSICAL EXAM:  VITAL SIGNS: ED Triage Vitals  Enc Vitals Group     BP 11/27/19 1140 109/76     Pulse Rate 11/27/19 1140 (!) 125     Resp 11/27/19 1140 16     Temp 11/27/19 1140 99.5 F (37.5 C)     Temp Source 11/27/19 1140 Oral     SpO2 11/27/19 1140 96 %      Weight 11/27/19 1141 95 lb (43.1 kg)     Height 11/27/19 1141 5' (1.524 m)     Head Circumference --      Peak Flow --      Pain Score 11/27/19 1140 8     Pain Loc --      Pain Edu? --      Excl. in GC? --     Constitutional: Alert and oriented. Well appearing and in no acute distress. Eyes: Conjunctivae are normal. EOMI. Head: Atraumatic. Nose: No congestion/rhinnorhea. Mouth/Throat: Mucous membranes are moist.   Neck: No stridor. Trachea Midline. FROM Cardiovascular: Tachycardic, regular rhythm. Grossly normal heart sounds.  Good peripheral circulation. Respiratory: Normal respiratory effort.  No retractions. Lungs CTAB. Gastrointestinal: Soft and nontender. No distention. No abdominal bruits.  Musculoskeletal: No lower extremity tenderness nor edema.  No joint effusions. Neurologic:  Normal speech and language. No gross focal neurologic deficits are appreciated.  Skin:  Skin is warm, dry and intact. No rash noted. Psychiatric: Mood and affect are normal. Speech and behavior are normal. GU: Deferred  Back right CVA tenderness ____________________________________________   LABS (all labs ordered are listed, but only abnormal results are displayed)  Labs Reviewed  CBC WITH DIFFERENTIAL/PLATELET - Abnormal; Notable for the following components:      Result Value   WBC 23.1 (*)    Neutro Abs 20.0 (*)    Monocytes Absolute 1.3 (*)    Abs Immature Granulocytes 0.13 (*)    All other components within normal limits  BASIC METABOLIC PANEL - Abnormal; Notable for the following components:   Sodium 133 (*)    Potassium 3.2 (*)    Glucose, Bld 215 (*)    Calcium 8.7 (*)    All other components within normal limits  URINALYSIS, COMPLETE (UACMP) WITH MICROSCOPIC - Abnormal; Notable for the following components:   Color, Urine AMBER (*)    APPearance TURBID (*)    Glucose, UA 50 (*)    Hgb urine dipstick MODERATE (*)    Protein, ur 100 (*)    Leukocytes,Ua LARGE (*)    WBC, UA  >50 (*)    Bacteria, UA FEW (*)    All other components within normal limits  CULTURE, BLOOD (ROUTINE X 2)  CULTURE, BLOOD (ROUTINE X 2)  URINE CULTURE  LACTIC ACID, PLASMA  LACTIC ACID, PLASMA   ____________________________________________  RADIOLOGY   Official radiology report(s): CT Renal Stone Study  Result Date: 11/27/2019 CLINICAL DATA:  Dysuria and fever. EXAM: CT ABDOMEN AND PELVIS WITHOUT CONTRAST TECHNIQUE: Multidetector CT imaging of the abdomen and pelvis was performed following the standard protocol without IV contrast. COMPARISON:  None. FINDINGS: Lower chest: No acute abnormality. Hepatobiliary: No focal liver abnormality is seen. No gallstones, gallbladder wall thickening, or biliary dilatation. Pancreas: Unremarkable. No pancreatic ductal  dilatation or surrounding inflammatory changes. Spleen: Normal in size without focal abnormality. Adrenals/Urinary Tract: Normal adrenal glands. No evidence of hydronephrosis. Several bilateral bulky calculi within the kidneys. The ureters are not well visualized but there is no gross evidence of hydroureter. The urinary bladder appears normal. Stomach/Bowel: Stomach is within normal limits. No evidence of bowel wall thickening, distention, or inflammatory changes. Vascular/Lymphatic: Aortic atherosclerosis. No enlarged abdominal or pelvic lymph nodes. Reproductive: Bulky uterus with multiple myometrial masses. Other: No abdominal wall hernia or abnormality. No abdominopelvic ascites. Musculoskeletal: Posterior facet arthropathy in the lower lumbosacral spine. IMPRESSION: 1. No evidence of obstructive uropathy. 2. 4 mm calculus in the left pelvis is difficult to determine whether it represents distal ureteral calculus or a phlebolith. No evidence of hydroureter. 3. Bilateral nephrolithiasis with calculi measuring up to 8 mm. 4. Bulky uterus with multiple myometrial masses, likely representing fibroids. Aortic Atherosclerosis, advanced for age.   (ICD10-I70.0). Electronically Signed   By: Fidela Salisbury M.D.   On: 11/27/2019 13:36    ____________________________________________   PROCEDURES  Procedure(s) performed (including Critical Care):  .Critical Care Performed by: Vanessa Sandwich, MD Authorized by: Vanessa Yates, MD   Critical care provider statement:    Critical care time (minutes):  31   Critical care was necessary to treat or prevent imminent or life-threatening deterioration of the following conditions:  Sepsis   Critical care was time spent personally by me on the following activities:  Discussions with consultants, evaluation of patient's response to treatment, examination of patient, ordering and performing treatments and interventions, ordering and review of laboratory studies, ordering and review of radiographic studies, pulse oximetry, re-evaluation of patient's condition, obtaining history from patient or surrogate and review of old charts     ____________________________________________   INITIAL IMPRESSION / ASSESSMENT AND PLAN / ED COURSE  Penny Gill was evaluated in Emergency Department on 11/27/2019 for the symptoms described in the history of present illness. She was evaluated in the context of the global COVID-19 pandemic, which necessitated consideration that the patient might be at risk for infection with the SARS-CoV-2 virus that causes COVID-19. Institutional protocols and algorithms that pertain to the evaluation of patients at risk for COVID-19 are in a state of rapid change based on information released by regulatory bodies including the CDC and federal and state organizations. These policies and algorithms were followed during the patient's care in the ED.    Patient is a 59 year old who comes in tachycardic, with dysuria and right flank pain.  Given history of kidney stones will get CT renal to make sure no evidence of infected kidney stone.  Will get urine to evaluate for UTI.  Patient is  meet SIRS criteria with her white count and are resulted therefore will get blood cultures to evaluate for bacteremia, lactate, start fluid resuscitation's and start antibiotics to cover for UTI with ceftriaxone.  No abdominal tenderness to suggest appendicitis.  On reevaluation patient's heart rate is coming down after the fluids and patient appears more comfortable.  Patient looks well perfused upon examination.  CT scan shows a questionable 4 mm calculus in the left pelvis but no evidence of hydroureter.  Patient is not having any pain in the left side so I do not think this represents an infected kidney stone.  I think most likely this is secondary UTI and sepsis therefore will admit patient.  ____________________________________________   FINAL CLINICAL IMPRESSION(S) / ED DIAGNOSES   Final diagnoses:  Cystitis  Sepsis, due  to unspecified organism, unspecified whether acute organ dysfunction present [facility])      MEDICATIONS GIVEN DURING THIS VISIT:  Medications  cefTRIAXone (ROCEPHIN) 1 g in sodium chloride 0.9 % 100 mL IVPB (1 g Intravenous New Bag/Given 11/27/19 1339)  sodium chloride 0.9 % bolus 1,000 mL (1,000 mLs Intravenous New Bag/Given 11/27/19 1338)    And  sodium chloride 0.9 % bolus 500 mL (has no administration in time range)  HYDROmorphone (DILAUDID) injection 0.5 mg (0.5 mg Intravenous Given 11/27/19 1342)  ondansetron (ZOFRAN) injection 4 mg (4 mg Intravenous Given 11/27/19 1342)     ED Discharge Orders    None       Note:  This document was prepared using Dragon voice recognition software and may include unintentional dictation errors.   Concha Se, MD 11/27/19 1352

## 2019-11-27 NOTE — Progress Notes (Signed)
PHARMACIST - PHYSICIAN COMMUNICATION  CONCERNING:  Enoxaparin (Lovenox) for DVT Prophylaxis    RECOMMENDATION: Patient was prescribed enoxaparin 40mg  q24 hours for VTE prophylaxis.   Filed Weights   11/27/19 1141  Weight: 43.1 kg (95 lb)    Body mass index is 18.55 kg/m.  Estimated Creatinine Clearance: 43 mL/min (by C-G formula based on SCr of 0.97 mg/dL).   Patient is candidate for enoxaparin 30mg  every 24 hours based on CrCl <81ml/min or Weight less then 45kg for women or <57kg for men   DESCRIPTION: Pharmacy has adjusted enoxaparin dose per [facility] policy.  Patient is now receiving enoxaparin 30mg  every 24 hours.  31m Pharmacy Resident 11/27/2019 6:58 PM

## 2019-11-27 NOTE — ED Notes (Signed)
Pt given meal tray and something to drink.   

## 2019-11-27 NOTE — Progress Notes (Signed)
CODE SEPSIS - PHARMACY COMMUNICATION  **Broad Spectrum Antibiotics should be administered within 1 hour of Sepsis diagnosis**  Time Code Sepsis Called/Page Received: 1230  Antibiotics Ordered: CTX  Time of 1st antibiotic administration: 1339  Additional action taken by pharmacy: Spoke with RN at 1310- patient in CT- plan to give abx upon return   Tressie Ellis Pharmacy Resident 11/27/2019  12:41 PM

## 2019-11-27 NOTE — ED Triage Notes (Signed)
Pt to ED via POV c/o milky urine, dysuria, and fever. When asked how high her temperature was at home pt stated that she has not checked it. Pt is in NAD at this time. Sx's x 1 day

## 2019-11-28 DIAGNOSIS — A419 Sepsis, unspecified organism: Principal | ICD-10-CM

## 2019-11-28 DIAGNOSIS — N390 Urinary tract infection, site not specified: Secondary | ICD-10-CM

## 2019-11-28 LAB — PROTIME-INR
INR: 1.2 (ref 0.8–1.2)
Prothrombin Time: 15.4 seconds — ABNORMAL HIGH (ref 11.4–15.2)

## 2019-11-28 LAB — CBC
HCT: 32.1 % — ABNORMAL LOW (ref 36.0–46.0)
Hemoglobin: 11 g/dL — ABNORMAL LOW (ref 12.0–15.0)
MCH: 29.9 pg (ref 26.0–34.0)
MCHC: 34.3 g/dL (ref 30.0–36.0)
MCV: 87.2 fL (ref 80.0–100.0)
Platelets: 232 10*3/uL (ref 150–400)
RBC: 3.68 MIL/uL — ABNORMAL LOW (ref 3.87–5.11)
RDW: 13.1 % (ref 11.5–15.5)
WBC: 17.5 10*3/uL — ABNORMAL HIGH (ref 4.0–10.5)
nRBC: 0 % (ref 0.0–0.2)

## 2019-11-28 LAB — CORTISOL-AM, BLOOD: Cortisol - AM: 16.4 ug/dL (ref 6.7–22.6)

## 2019-11-28 LAB — HIV ANTIBODY (ROUTINE TESTING W REFLEX): HIV Screen 4th Generation wRfx: NONREACTIVE

## 2019-11-28 LAB — BASIC METABOLIC PANEL
Anion gap: 3 — ABNORMAL LOW (ref 5–15)
BUN: 9 mg/dL (ref 6–20)
CO2: 24 mmol/L (ref 22–32)
Calcium: 8.1 mg/dL — ABNORMAL LOW (ref 8.9–10.3)
Chloride: 112 mmol/L — ABNORMAL HIGH (ref 98–111)
Creatinine, Ser: 0.52 mg/dL (ref 0.44–1.00)
GFR calc Af Amer: >60 mL/min (ref >60)
GFR calc non Af Amer: >60 mL/min (ref >60)
Glucose, Bld: 152 mg/dL — ABNORMAL HIGH (ref 70–99)
Potassium: 4.2 mmol/L (ref 3.5–5.1)
Sodium: 139 mmol/L (ref 135–145)

## 2019-11-28 LAB — TSH: TSH: 0.585 u[IU]/mL (ref 0.350–4.500)

## 2019-11-28 LAB — PROCALCITONIN: Procalcitonin: 0.88 ng/mL

## 2019-11-28 MED ORDER — KETOROLAC TROMETHAMINE 15 MG/ML IJ SOLN
15.0000 mg | Freq: Three times a day (TID) | INTRAMUSCULAR | Status: DC | PRN
  Administered 2019-11-28: 15 mg via INTRAVENOUS
  Filled 2019-11-28: qty 1

## 2019-11-28 MED ORDER — ENSURE ENLIVE PO LIQD: 237.0000 mL | Freq: Two times a day (BID) | ORAL | Status: DC

## 2019-11-28 MED ORDER — TRAZODONE HCL 50 MG PO TABS: 50.0000 mg | ORAL_TABLET | Freq: Every day | ORAL | Status: DC

## 2019-11-28 MED ORDER — ENSURE ENLIVE PO LIQD
237.0000 mL | Freq: Three times a day (TID) | ORAL | Status: DC
  Administered 2019-11-28 – 2019-11-29 (×3): 237 mL via ORAL

## 2019-11-28 MED ORDER — TRAZODONE HCL 50 MG PO TABS
150.0000 mg | ORAL_TABLET | Freq: Every day | ORAL | Status: DC
  Administered 2019-11-28: 150 mg via ORAL
  Filled 2019-11-28: qty 1

## 2019-11-28 MED ORDER — HYDROMORPHONE HCL 1 MG/ML IJ SOLN
0.5000 mg | Freq: Once | INTRAMUSCULAR | Status: AC
  Administered 2019-11-28: 05:00:00 0.5 mg via INTRAVENOUS
  Filled 2019-11-28: qty 0.5

## 2019-11-28 NOTE — Progress Notes (Signed)
Initial Nutrition Assessment  DOCUMENTATION CODES:   Non-severe (moderate) malnutrition in context of social or environmental circumstances  INTERVENTION:   Ensure Enlive po TID, each supplement provides 350 kcal and 20 grams of protein  Magic cup TID with meals, each supplement provides 290 kcal and 9 grams of protein  MVI daily   NUTRITION DIAGNOSIS:   Moderate Malnutrition related to social / environmental circumstances as evidenced by moderate fat depletion, moderate muscle to severe muscle depletions.  GOAL:   Patient will meet greater than or equal to 90% of their needs  MONITOR:   PO intake, Supplement acceptance, Labs, Weight trends, Skin, I & O's  REASON FOR ASSESSMENT:   Consult Assessment of nutrition requirement/status  ASSESSMENT:   59 y.o. female with medical history significant for hypertension and nephrolithiasis who presents to the emergency room for evaluation of painful urination as well as right flank pain. Pt found to have sepsis with a urinary source   Met with pt in room today. Pt reports fairly good appetite and oral intake at baseline but reports that her appetite has been decreased for several days pta. Pt does drink vanilla Ensure at home regularly. Pt also takes prenatal vitamins daily. Pt eating 30% of meals in hospital. RD will add supplements to help pt meet her estimated needs. Pt reports her UBW used to be around 113lbs several years ago. Per chart, pt appears weight stable pta.   Medications reviewed and include: lovenox, MVI, NaCl w/ KCl @125ml /hr, ceftriaxone   Labs reviewed: wbc- 17.5(H)  NUTRITION - FOCUSED PHYSICAL EXAM:    Most Recent Value  Orbital Region  No depletion  Upper Arm Region  Moderate depletion  Thoracic and Lumbar Region  Moderate depletion  Buccal Region  No depletion  Temple Region  No depletion  Clavicle Bone Region  Moderate depletion  Clavicle and Acromion Bone Region  Moderate depletion  Scapular Bone  Region  Moderate depletion  Dorsal Hand  Moderate depletion  Patellar Region  Severe depletion  Anterior Thigh Region  Severe depletion  Posterior Calf Region  Severe depletion  Edema (RD Assessment)  None  Hair  Reviewed  Eyes  Reviewed  Mouth  Reviewed  Skin  Reviewed  Nails  Reviewed     Diet Order:   Diet Order            Diet 2 gram sodium Room service appropriate? Yes; Fluid consistency: Thin  Diet effective now             EDUCATION NEEDS:   Education needs have been addressed  Skin:  Skin Assessment: Reviewed RN Assessment  Last BM:  4/2  Height:   Ht Readings from Last 1 Encounters:  11/27/19 5' (1.524 m)    Weight:   Wt Readings from Last 1 Encounters:  11/27/19 43.1 kg    Ideal Body Weight:  45.4 kg  BMI:  Body mass index is 18.55 kg/m.  Estimated Nutritional Needs:   Kcal:  1300-1500kcal/day  Protein:  65-75g/day  Fluid:  >1.3L/day  Koleen Distance MS, RD, LDN Please refer to [facility] for RD and/or RD on-call/weekend/after hours pager

## 2019-11-28 NOTE — Progress Notes (Addendum)
PROGRESS NOTE    Penny Gill  CHY:[id number] DOB: 02/01/1961 DOA: 11/27/2019 PCP: Rayetta Humphrey, MD    Assessment & Plan:   Principal Problem:   Sepsis secondary to UTI [facility]) Active Problems:   Essential hypertension   Hypokalemia   Sepsis (HCC)   Acute pyelonephritis    Penny Gill is a 59 y.o. female with medical history significant for hypertension and nephrolithiasis who presents to the emergency room for evaluation of painful urination as well as right flank pain.  Right flank pain has been constant, non radiating, rated a 7 x 10 in intensity at its worst associated with nausea but no vomiting.  She had a fever and chills.   Sepsis from pyelonephritis Bilateral nephrolithiasis  --right flank pain associated with dysuria and fever. She has right CVA tenderness.  she was tachycardic, she had marked leukocytosis with a left shift and pyuria. Lactic acid was normal. --s/p IVF and started on Rocephin PLAN: --continue Rocephin 1 g IV daily pending urine cx --Continue MIVF --Tylenol and IV toradol PRN for pain --May need outpatient urology followup for lithotripsy.    # Watery diarrhea --obtain C diff test  Hypokalemia Supplement potassium  Hx of Hypertension Hold amlodipine as patient is normotensive  Protein calorie malnutrition Continue nutritional supplements Dietary consult   DVT prophylaxis: Lovenox SQ Code Status: Full code  Family Communication:  Disposition Plan: Home after at least 3 days of IV abx and if symptoms improve   Subjective and Interval History:  Pt reported pain a little improved.  Complained of decreased appetite and lack of taste, and weight loss.  No dyspnea, chest pain, N/V.   Nursing reported 5 episodes of watery diarrhea later in the day.   Objective: Vitals:   11/27/19 2337 11/28/19 0427 11/28/19 1316 11/28/19 1927  BP: 110/68 109/67 131/90 130/87  Pulse:  99 93 98  Resp:  17 18 18   Temp: 98 F (36.7 C) 99.7 F  (37.6 C) 99.5 F (37.5 C) 98.5 F (36.9 C)  TempSrc: Oral  Oral Oral  SpO2: 100% 97% 100% 100%  Weight:      Height:        Intake/Output Summary (Last 24 hours) at 11/28/2019 2011 Last data filed at 11/28/2019 1835 Gross per 24 hour  Intake 1415.89 ml  Output --  Net 1415.89 ml   Filed Weights   11/27/19 1141  Weight: 43.1 kg    Examination:   Constitutional: NAD, AAOx3 HEENT: conjunctivae and lids normal, EOMI CV: RRR no M,R,G. Distal pulses +2.  No cyanosis.   RESP: CTA B/L, normal respiratory effort  GI: +BS, NTND Extremities: No effusions, edema, or tenderness in BLE SKIN: warm, dry and intact Neuro: II - XII grossly intact.  Sensation intact Psych: Normal mood and affect.  Appropriate judgement and reason   Data Reviewed: I have personally reviewed following labs and imaging studies  CBC: Recent Labs  Lab 11/27/19 1144 11/28/19 0523  WBC 23.1* 17.5*  NEUTROABS 20.0*  --   HGB 14.1 11.0*  HCT 41.3 32.1*  MCV 88.1 87.2  PLT 291 232   Basic Metabolic Panel: Recent Labs  Lab 11/27/19 1144 11/28/19 0523  NA 133* 139  K 3.2* 4.2  CL 98 112*  CO2 25 24  GLUCOSE 215* 152*  BUN 17 9  CREATININE 0.97 0.52  CALCIUM 8.7* 8.1*   GFR: Estimated Creatinine Clearance: 52.2 mL/min (by C-G formula based on SCr of 0.52 mg/dL). Liver Function Tests:  No results for input(s): AST, ALT, ALKPHOS, BILITOT, PROT, ALBUMIN in the last 168 hours. No results for input(s): LIPASE, AMYLASE in the last 168 hours. No results for input(s): AMMONIA in the last 168 hours. Coagulation Profile: Recent Labs  Lab 11/28/19 0523  INR 1.2   Cardiac Enzymes: No results for input(s): CKTOTAL, CKMB, CKMBINDEX, TROPONINI in the last 168 hours. BNP (last 3 results) No results for input(s): PROBNP in the last 8760 hours. HbA1C: No results for input(s): HGBA1C in the last 72 hours. CBG: No results for input(s): GLUCAP in the last 168 hours. Lipid Profile: No results for input(s):  CHOL, HDL, LDLCALC, TRIG, CHOLHDL, LDLDIRECT in the last 72 hours. Thyroid Function Tests: Recent Labs    11/28/19 0523  TSH 0.585   Anemia Panel: No results for input(s): VITAMINB12, FOLATE, FERRITIN, TIBC, IRON, RETICCTPCT in the last 72 hours. Sepsis Labs: Recent Labs  Lab 11/27/19 1258 11/28/19 0523  PROCALCITON  --  0.88  LATICACIDVEN 1.6  --     Recent Results (from the past 240 hour(s))  Urine culture     Status: Abnormal (Preliminary result)   Collection Time: 11/27/19 11:44 AM   Specimen: In/Out Cath Urine  Result Value Ref Range Status   Specimen Description   Final    IN/OUT CATH URINE Performed at [facility], [address] [place], [place] [address]    Special Requests   Final    NONE Performed at [facility], [address] [place], [place] [address]    Culture (A)  Final    >=100,000 COLONIES/mL ESCHERICHIA COLI CULTURE REINCUBATED FOR BETTER GROWTH SUSCEPTIBILITIES TO FOLLOW Performed at [facility], [address] [place], [place] [address]    Report Status PENDING  Incomplete  Blood culture (routine x 2)     Status: None (Preliminary result)   Collection Time: 11/27/19 12:58 PM   Specimen: BLOOD  Result Value Ref Range Status   Specimen Description BLOOD RAC  Final   Special Requests   Final    BOTTLES DRAWN AEROBIC AND ANAEROBIC Blood Culture adequate volume   Culture   Final    NO GROWTH < 24 HOURS Performed at [facility], [address] [place], [place] [address]    Report Status PENDING  Incomplete  Blood culture (routine x 2)     Status: None (Preliminary result)   Collection Time: 11/27/19 12:58 PM   Specimen: BLOOD  Result Value Ref Range Status   Specimen Description BLOOD R FOREARM  Final   Special Requests   Final    BOTTLES DRAWN AEROBIC AND ANAEROBIC Blood Culture adequate volume   Culture   Final    NO GROWTH < 24 HOURS Performed at [facility], [address]  [place], [place] [address]    Report Status PENDING  Incomplete  SARS CORONAVIRUS 2 (TAT 6-24 HRS) Nasopharyngeal Nasopharyngeal Swab     Status: None   Collection Time: 11/27/19  3:08 PM   Specimen: Nasopharyngeal Swab  Result Value Ref Range Status   SARS Coronavirus 2 NEGATIVE NEGATIVE Final    Comment: (NOTE) SARS-CoV-2 target nucleic acids are NOT DETECTED. The SARS-CoV-2 RNA is generally detectable in upper and lower respiratory specimens during the acute phase of infection. Negative results do not preclude SARS-CoV-2 infection, do not rule out co-infections with other pathogens, and should not be used as the sole basis for treatment or other patient management decisions. Negative results must be combined with  clinical observations, patient history, and epidemiological information. The expected result is Negative. Fact Sheet for Patients: SugarRoll.be Fact Sheet for Healthcare Providers: https://www.woods-mathews.com/ This test is not yet approved or cleared by the [place] FDA and  has been authorized for detection and/or diagnosis of SARS-CoV-2 by FDA under an Emergency Use Authorization (EUA). This EUA will remain  in effect (meaning this test can be used) for the duration of the COVID-19 declaration under Section 56 4(b)(1) of the Act, 21 U.S.C. section 360bbb-3(b)(1), unless the authorization is terminated or revoked sooner. Performed at [facility], [place] [address] [place], [place] [address]       Radiology Studies: CT Renal Stone Study  Result Date: 11/27/2019 CLINICAL DATA:  Dysuria and fever. EXAM: CT ABDOMEN AND PELVIS WITHOUT CONTRAST TECHNIQUE: Multidetector CT imaging of the abdomen and pelvis was performed following the standard protocol without IV contrast. COMPARISON:  None. FINDINGS: Lower chest: No acute abnormality. Hepatobiliary: No focal liver abnormality is seen. No gallstones, gallbladder wall thickening,  or biliary dilatation. Pancreas: Unremarkable. No pancreatic ductal dilatation or surrounding inflammatory changes. Spleen: Normal in size without focal abnormality. Adrenals/Urinary Tract: Normal adrenal glands. No evidence of hydronephrosis. Several bilateral bulky calculi within the kidneys. The ureters are not well visualized but there is no gross evidence of hydroureter. The urinary bladder appears normal. Stomach/Bowel: Stomach is within normal limits. No evidence of bowel wall thickening, distention, or inflammatory changes. Vascular/Lymphatic: Aortic atherosclerosis. No enlarged abdominal or pelvic lymph nodes. Reproductive: Bulky uterus with multiple myometrial masses. Other: No abdominal wall hernia or abnormality. No abdominopelvic ascites. Musculoskeletal: Posterior facet arthropathy in the lower lumbosacral spine. IMPRESSION: 1. No evidence of obstructive uropathy. 2. 4 mm calculus in the left pelvis is difficult to determine whether it represents distal ureteral calculus or a phlebolith. No evidence of hydroureter. 3. Bilateral nephrolithiasis with calculi measuring up to 8 mm. 4. Bulky uterus with multiple myometrial masses, likely representing fibroids. Aortic Atherosclerosis, advanced for age.  (ICD10-I70.0). Electronically Signed   By: Fidela Salisbury M.D.   On: 11/27/2019 13:36     Scheduled Meds: . atorvastatin  20 mg Oral QHS  . enoxaparin (LOVENOX) injection  30 mg Subcutaneous Q24H  . feeding supplement (ENSURE ENLIVE)  237 mL Oral TID BM  . gabapentin  100 mg Oral TID  . prenatal vitamin w/FE, FA  1 tablet Oral Daily  . traZODone  150 mg Oral QHS   Continuous Infusions: . 0.9 % NaCl with KCl 40 mEq / L 125 mL/hr (11/28/19 1817)  . cefTRIAXone (ROCEPHIN)  IV 1 g (11/28/19 1332)     LOS: 1 day     Enzo Bi, MD [facility] If 7PM-7AM, please contact night-coverage 11/28/2019, 8:11 PM

## 2019-11-29 LAB — CBC
HCT: 31.7 % — ABNORMAL LOW (ref 36.0–46.0)
Hemoglobin: 10.7 g/dL — ABNORMAL LOW (ref 12.0–15.0)
MCH: 30.1 pg (ref 26.0–34.0)
MCHC: 33.8 g/dL (ref 30.0–36.0)
MCV: 89 fL (ref 80.0–100.0)
Platelets: 242 10*3/uL (ref 150–400)
RBC: 3.56 MIL/uL — ABNORMAL LOW (ref 3.87–5.11)
RDW: 13 % (ref 11.5–15.5)
WBC: 8.6 10*3/uL (ref 4.0–10.5)
nRBC: 0 % (ref 0.0–0.2)

## 2019-11-29 LAB — BASIC METABOLIC PANEL
Anion gap: 6 (ref 5–15)
BUN: 9 mg/dL (ref 6–20)
CO2: 25 mmol/L (ref 22–32)
Calcium: 8.6 mg/dL — ABNORMAL LOW (ref 8.9–10.3)
Chloride: 109 mmol/L (ref 98–111)
Creatinine, Ser: 0.69 mg/dL (ref 0.44–1.00)
GFR calc Af Amer: >60 mL/min (ref >60)
GFR calc non Af Amer: >60 mL/min (ref >60)
Glucose, Bld: 191 mg/dL — ABNORMAL HIGH (ref 70–99)
Potassium: 4.6 mmol/L (ref 3.5–5.1)
Sodium: 140 mmol/L (ref 135–145)

## 2019-11-29 LAB — MAGNESIUM: Magnesium: 1.6 mg/dL — ABNORMAL LOW (ref 1.7–2.4)

## 2019-11-29 MED ORDER — CIPROFLOXACIN HCL 500 MG PO TABS
500.0000 mg | ORAL_TABLET | Freq: Two times a day (BID) | ORAL | Status: DC
  Administered 2019-11-29: 500 mg via ORAL
  Filled 2019-11-29: qty 1

## 2019-11-29 MED ORDER — CIPROFLOXACIN HCL 500 MG PO TABS: 500.0000 mg | ORAL_TABLET | Freq: Two times a day (BID) | ORAL | 0 refills | Status: AC

## 2019-11-29 MED ORDER — DICLOFENAC SODIUM 50 MG PO TBEC: 50.0000 mg | DELAYED_RELEASE_TABLET | Freq: Three times a day (TID) | ORAL | Status: DC | PRN

## 2019-11-29 MED ORDER — MAGNESIUM SULFATE 2 GM/50ML IV SOLN
2.0000 g | Freq: Once | INTRAVENOUS | Status: AC
  Administered 2019-11-29: 10:00:00 2 g via INTRAVENOUS
  Filled 2019-11-29: qty 50

## 2019-11-29 NOTE — Discharge Summary (Signed)
Physician Discharge Summary   Penny Gill  female DOB: November 13, 1960  DDU:[id number]  PCP: Sharyne Peach, MD  Admit date: 11/27/2019 Discharge date: 11/29/2019  Admitted From: home Disposition:  home CODE STATUS: Full code  Discharge Instructions    Ambulatory referral to Urology   Complete by: As directed    Kidney stones   Diet - low sodium heart healthy   Complete by: As directed    Discharge instructions   Complete by: As directed    You have received 3 days of IV antibiotic for your kidney infection.  Please continue 7 days of oral antibiotic Cipro 500 mg twice a day at home.  Please follow up with urology as outpatient to manage your kidney stones.   Dr. Enzo Bi - -   Increase activity slowly   Complete by: As directed        Hospital Course:  For full details, please see H&P, progress notes, consult notes and ancillary notes.  Briefly,  Penny Margolis Poteatis a 59 y.o.femalewith medical history significant forhypertension and nephrolithiasis who presented to the emergency room for evaluation of painful urinationaswell as right flank pain.Right flank pain has been constant,non radiating,rated a 7x 10 in intensity at its worst associated with nausea but no vomiting.She had a fever and chills.   Sepsis from pyelonephritis Bilateral nephrolithiasis up to 8 mm She has right CVA tenderness and dysuria.  She had fever, tachycardia, and marked leukocytosis with a left shift and pyuria, indicating a systemic response to the infection, therefore meeting sepsis criteria.  Lacticacid was normal.  Pt received IVF and started on Rocephin.  Urine cx grew E coli pansensitive.  Pt received 3 days of IV Rocephin with improvement in her symptoms, and was discharged home on 7 more days of Cipro.    Pt may need outpatient urology followup for lithotripsy, so referral order placed and pt was given contact info for urology clinic.  Hypokalemia Supplement potassium  Hx  of Hypertension Held amlodipine as patient was normotensive during hospitalization.  Resumed at discharge.  Protein calorie malnutrition Continued nutritional supplements Dietary consult   Discharge Diagnoses:  Principal Problem:   Sepsis secondary to UTI [facility]) Active Problems:   Essential hypertension   Hypokalemia   Sepsis ([place])   Acute pyelonephritis    Discharge Instructions:  Allergies as of 11/29/2019   No Known Allergies     Medication List    TAKE these medications   amLODipine 5 MG tablet Commonly known as: NORVASC Take 5 mg by mouth daily.   atorvastatin 20 MG tablet Commonly known as: LIPITOR Take 20 mg by mouth at bedtime.   ciprofloxacin 500 MG tablet Commonly known as: CIPRO Take 1 tablet (500 mg total) by mouth 2 (two) times daily for 7 days. Antibiotic.   diclofenac 50 MG EC tablet Commonly known as: VOLTAREN Take 1 tablet (50 mg total) by mouth 3 (three) times daily as needed. What changed:   when to take this  reasons to take this   Ensure Active High Protein Liqd Take 237 mLs by mouth in the morning and at bedtime.   gabapentin 100 MG capsule Commonly known as: NEURONTIN Take 100 mg by mouth 3 (three) times daily.   PrePLUS 27-1 MG Tabs Take 1 tablet by mouth daily.       Follow-up Information    Sharyne Peach, MD. Schedule an appointment as soon as possible for a visit in 1 week(s).   Specialty: Family  Medicine Contact information: [address] [place] [address] [phone]        Riki Altes, MD Follow up.   Specialty: Urology Why: follow up for your kidney stones Contact information: [address] [place] [address] [phone]           No Known Allergies   The results of significant diagnostics from this hospitalization (including imaging, microbiology, ancillary and laboratory) are listed below for reference.   Consultations:   Procedures/Studies: CT Renal Stone  Study  Result Date: 11/27/2019 CLINICAL DATA:  Dysuria and fever. EXAM: CT ABDOMEN AND PELVIS WITHOUT CONTRAST TECHNIQUE: Multidetector CT imaging of the abdomen and pelvis was performed following the standard protocol without IV contrast. COMPARISON:  None. FINDINGS: Lower chest: No acute abnormality. Hepatobiliary: No focal liver abnormality is seen. No gallstones, gallbladder wall thickening, or biliary dilatation. Pancreas: Unremarkable. No pancreatic ductal dilatation or surrounding inflammatory changes. Spleen: Normal in size without focal abnormality. Adrenals/Urinary Tract: Normal adrenal glands. No evidence of hydronephrosis. Several bilateral bulky calculi within the kidneys. The ureters are not well visualized but there is no gross evidence of hydroureter. The urinary bladder appears normal. Stomach/Bowel: Stomach is within normal limits. No evidence of bowel wall thickening, distention, or inflammatory changes. Vascular/Lymphatic: Aortic atherosclerosis. No enlarged abdominal or pelvic lymph nodes. Reproductive: Bulky uterus with multiple myometrial masses. Other: No abdominal wall hernia or abnormality. No abdominopelvic ascites. Musculoskeletal: Posterior facet arthropathy in the lower lumbosacral spine. IMPRESSION: 1. No evidence of obstructive uropathy. 2. 4 mm calculus in the left pelvis is difficult to determine whether it represents distal ureteral calculus or a phlebolith. No evidence of hydroureter. 3. Bilateral nephrolithiasis with calculi measuring up to 8 mm. 4. Bulky uterus with multiple myometrial masses, likely representing fibroids. Aortic Atherosclerosis, advanced for age.  (ICD10-I70.0). Electronically Signed   By: Ted Mcalpine M.D.   On: 11/27/2019 13:36      Labs: BNP (last 3 results) No results for input(s): BNP in the last 8760 hours. Basic Metabolic Panel: Recent Labs  Lab 11/27/19 1144 11/28/19 0523 11/29/19 0459  NA 133* 139 140  K 3.2* 4.2 4.6  CL 98 112*  109  CO2 25 24 25   GLUCOSE 215* 152* 191*  BUN 17 9 9   CREATININE 0.97 0.52 0.69  CALCIUM 8.7* 8.1* 8.6*  MG  --   --  1.6*   Liver Function Tests: No results for input(s): AST, ALT, ALKPHOS, BILITOT, PROT, ALBUMIN in the last 168 hours. No results for input(s): LIPASE, AMYLASE in the last 168 hours. No results for input(s): AMMONIA in the last 168 hours. CBC: Recent Labs  Lab 11/27/19 1144 11/28/19 0523 11/29/19 0459  WBC 23.1* 17.5* 8.6  NEUTROABS 20.0*  --   --   HGB 14.1 11.0* 10.7*  HCT 41.3 32.1* 31.7*  MCV 88.1 87.2 89.0  PLT 291 232 242   Cardiac Enzymes: No results for input(s): CKTOTAL, CKMB, CKMBINDEX, TROPONINI in the last 168 hours. BNP: Invalid input(s): POCBNP CBG: No results for input(s): GLUCAP in the last 168 hours. D-Dimer No results for input(s): DDIMER in the last 72 hours. Hgb A1c No results for input(s): HGBA1C in the last 72 hours. Lipid Profile No results for input(s): CHOL, HDL, LDLCALC, TRIG, CHOLHDL, LDLDIRECT in the last 72 hours. Thyroid function studies Recent Labs    11/28/19 0523  TSH 0.585   Anemia work up No results for input(s): VITAMINB12, FOLATE, FERRITIN, TIBC, IRON, RETICCTPCT in the last  72 hours. Urinalysis    Component Value Date/Time   COLORURINE AMBER (A) 11/27/2019 1144   APPEARANCEUR TURBID (A) 11/27/2019 1144   APPEARANCEUR Hazy 07/05/2014 1255   LABSPEC 1.011 11/27/2019 1144   LABSPEC 1.011 07/05/2014 1255   PHURINE 5.0 11/27/2019 1144   GLUCOSEU 50 (A) 11/27/2019 1144   GLUCOSEU Negative 07/05/2014 1255   HGBUR MODERATE (A) 11/27/2019 1144   BILIRUBINUR NEGATIVE 11/27/2019 1144   BILIRUBINUR Negative 07/05/2014 1255   KETONESUR NEGATIVE 11/27/2019 1144   PROTEINUR 100 (A) 11/27/2019 1144   NITRITE NEGATIVE 11/27/2019 1144   LEUKOCYTESUR LARGE (A) 11/27/2019 1144   LEUKOCYTESUR 1+ 07/05/2014 1255   Sepsis Labs Invalid input(s): PROCALCITONIN,  WBC,  LACTICIDVEN Microbiology Recent Results (from the  past 240 hour(s))  Urine culture     Status: Abnormal (Preliminary result)   Collection Time: 11/27/19 11:44 AM   Specimen: In/Out Cath Urine  Result Value Ref Range Status   Specimen Description   Final    IN/OUT CATH URINE Performed at [facility], [address] [place], [place] [address]    Special Requests   Final    NONE Performed at [facility], [address] [place], [place] [address]    Culture >=100,000 COLONIES/mL ESCHERICHIA COLI (A)  Final   Report Status PENDING  Incomplete   Organism ID, Bacteria ESCHERICHIA COLI (A)  Final      Susceptibility   Escherichia coli - MIC*    AMPICILLIN >=32 RESISTANT Resistant     CEFAZOLIN <=4 SENSITIVE Sensitive     CEFTRIAXONE <=0.25 SENSITIVE Sensitive     CIPROFLOXACIN <=0.25 SENSITIVE Sensitive     GENTAMICIN <=1 SENSITIVE Sensitive     IMIPENEM <=0.25 SENSITIVE Sensitive     NITROFURANTOIN <=16 SENSITIVE Sensitive     TRIMETH/SULFA <=20 SENSITIVE Sensitive     AMPICILLIN/SULBACTAM 16 INTERMEDIATE Intermediate     PIP/TAZO <=4 SENSITIVE Sensitive     * >=100,000 COLONIES/mL ESCHERICHIA COLI  Blood culture (routine x 2)     Status: None (Preliminary result)   Collection Time: 11/27/19 12:58 PM   Specimen: BLOOD  Result Value Ref Range Status   Specimen Description BLOOD RAC  Final   Special Requests   Final    BOTTLES DRAWN AEROBIC AND ANAEROBIC Blood Culture adequate volume   Culture   Final    NO GROWTH 2 DAYS Performed at [facility], [address] [place], [place] [address]    Report Status PENDING  Incomplete  Blood culture (routine x 2)     Status: None (Preliminary result)   Collection Time: 11/27/19 12:58 PM   Specimen: BLOOD  Result Value Ref Range Status   Specimen Description BLOOD R FOREARM  Final   Special Requests   Final    BOTTLES DRAWN AEROBIC AND ANAEROBIC Blood Culture adequate volume   Culture   Final    NO GROWTH 2 DAYS Performed at [facility],  [address] [place], [place] [address]    Report Status PENDING  Incomplete  SARS CORONAVIRUS 2 (TAT 6-24 HRS) Nasopharyngeal Nasopharyngeal Swab     Status: None   Collection Time: 11/27/19  3:08 PM   Specimen: Nasopharyngeal Swab  Result Value Ref Range Status   SARS Coronavirus 2 NEGATIVE NEGATIVE Final    Comment: (NOTE) SARS-CoV-2 target nucleic acids are NOT DETECTED. The SARS-CoV-2 RNA is generally detectable in upper and lower respiratory specimens during the acute phase of infection. Negative results do not preclude  SARS-CoV-2 infection, do not rule out co-infections with other pathogens, and should not be used as the sole basis for treatment or other patient management decisions. Negative results must be combined with clinical observations, patient history, and epidemiological information. The expected result is Negative. Fact Sheet for Patients: HairSlick.no Fact Sheet for Healthcare Providers: quierodirigir.com This test is not yet approved or cleared by the [place] FDA and  has been authorized for detection and/or diagnosis of SARS-CoV-2 by FDA under an Emergency Use Authorization (EUA). This EUA will remain  in effect (meaning this test can be used) for the duration of the COVID-19 declaration under Section 56 4(b)(1) of the Act, 21 U.S.C. section 360bbb-3(b)(1), unless the authorization is terminated or revoked sooner. Performed at [facility], [address], [place], [place] [address]      Total time spend on discharging this patient, including the last patient exam, discussing the hospital stay, instructions for ongoing care as it relates to all pertinent caregivers, as well as preparing the medical discharge records, prescriptions, and/or referrals as applicable, is 30 minutes.    Darlin Priestly, MD  [facility] 11/29/2019, 11:16 AM  If 7PM-7AM, please contact night-coverage

## 2019-11-29 NOTE — Plan of Care (Signed)
Discharge order received. Patient mental status is at baseline. Vital signs stable . No signs of acute distress. Discharge instructions given. Patient verbalized understanding. No other issues noted at this time.   

## 2019-11-30 LAB — URINE CULTURE: Culture: >=100000 — AB

## 2019-12-09 LAB — CULTURE, BLOOD (ROUTINE X 2)
Culture: NO GROWTH
Culture: NO GROWTH
Special Requests: ADEQUATE
Special Requests: ADEQUATE

## 2019-12-30 ENCOUNTER — Other Ambulatory Visit: Payer: Self-pay

## 2019-12-30 ENCOUNTER — Ambulatory Visit: Payer: BLUE CROSS/BLUE SHIELD | Admitting: Urology

## 2019-12-30 ENCOUNTER — Ambulatory Visit
Admission: RE | Admit: 2019-12-30 | Discharge: 2019-12-30 | Disposition: A | Discharge status: Home / Self Care | Payer: BLUE CROSS/BLUE SHIELD | Source: Ambulatory Visit | Attending: Urology | Admitting: Urology

## 2019-12-30 ENCOUNTER — Encounter: Payer: Self-pay | Admitting: Urology

## 2019-12-30 VITALS — BP 138/82 | HR 80 | Ht 60.0 in | Wt 81.0 lb

## 2019-12-30 DIAGNOSIS — R3129 Other microscopic hematuria: Secondary | ICD-10-CM | POA: Diagnosis not present

## 2019-12-30 DIAGNOSIS — R300 Dysuria: Secondary | ICD-10-CM

## 2019-12-30 DIAGNOSIS — N200 Calculus of kidney: Secondary | ICD-10-CM

## 2019-12-30 LAB — MICROSCOPIC EXAMINATION

## 2019-12-30 LAB — URINALYSIS, COMPLETE
Bilirubin, UA: NEGATIVE
Glucose, UA: NEGATIVE
Ketones, UA: NEGATIVE
Nitrite, UA: NEGATIVE
Protein,UA: NEGATIVE
Specific Gravity, UA: 1.02 (ref 1.005–1.030)
Urobilinogen, Ur: 0.2 mg/dL (ref 0.2–1.0)
pH, UA: 5.5 (ref 5.0–7.5)

## 2019-12-30 NOTE — Progress Notes (Signed)
12/30/2019 9:35 AM   Penny Gill 1961-01-15 [id number]  Referring provider: Rayetta Humphrey, MD [address] [place],  [place] [address]  Chief Complaint  Patient presents with  . Nephrolithiasis    HPI: Penny Gill is a 59 y.o. female who presents for hospital follow-up.   -seen on 11/27/2019 complaining of right flank pain -Rated 7/10, nausea without vomiting -No identifiable precipitating, aggravating or alleviating factors -Subjective fever and chills; -CT showed a possible 4 mm left ureteral calculus though no hydronephrosis/hydroureter -Bilateral nonobstructing renal calculi -UA with pyuria and urine culture positive for E. Coli -Admitted to hospitalist service; urology was not consulted -Discharged 4/6 with recommendations of urology follow-up -States dysuria returned approximately 1 week ago -No flank/abdominal pain -No fever/chills -Prior history stone disease; underwent bilateral ureteral stent placement and shockwave lithotripsy at [facility] ~10 years ago   PMH: Past Medical History:  Diagnosis Date  . Depression   . Hyperlipidemia   . Hypertension   . Kidney stone   . Sickle cell trait [facility])     Surgical History: Past Surgical History:  Procedure Laterality Date  . TUBAL LIGATION      Home Medications:  Allergies as of 12/30/2019   No Known Allergies     Medication List       Accurate as of Dec 30, 2019  9:35 AM. If you have any questions, ask your nurse or doctor.        amLODipine 5 MG tablet Commonly known as: NORVASC Take 5 mg by mouth daily.   atorvastatin 20 MG tablet Commonly known as: LIPITOR Take 20 mg by mouth at bedtime.   diclofenac 50 MG EC tablet Commonly known as: VOLTAREN Take 1 tablet (50 mg total) by mouth 3 (three) times daily as needed.   Ensure Active High Protein Liqd Take 237 mLs by mouth in the morning and at bedtime.   gabapentin 100 MG capsule Commonly known as: NEURONTIN Take 100 mg by mouth 3  (three) times daily.   PrePLUS 27-1 MG Tabs Take 1 tablet by mouth daily.   traZODone 150 MG tablet Commonly known as: DESYREL Take 150 mg by mouth at bedtime.       Allergies: No Known Allergies  Family History: Family History  Problem Relation Age of Onset  . Breast cancer Maternal Aunt 48    Social History:  reports that she has been smoking cigarettes. She has been smoking about 0.50 packs per day. She has never used smokeless tobacco. She reports current alcohol use. She reports current drug use. Drug: Marijuana.   Physical Exam: BP 138/82   Pulse 80   Ht 5' (1.524 m)   Wt 81 lb (36.7 kg)   BMI 15.82 kg/m   Constitutional:  Alert and oriented, No acute distress. HEENT: Penny Gill AT, moist mucus membranes.  Trachea midline, no masses. Cardiovascular: No clubbing, cyanosis, or edema. Respiratory: Normal respiratory effort, no increased work of breathing. GI: Abdomen is soft, nontender, nondistended, no abdominal masses GU: No CVA tenderness Lymph: No cervical or inguinal lymphadenopathy. Skin: No rashes, bruises or suspicious lesions. Neurologic: Grossly intact, no focal deficits, moving all 4 extremities. Psychiatric: Normal mood and affect.   Pertinent Imaging: Images were personally reviewed.  Some of her renal calculi appear parenchymal  Results for orders placed during the hospital encounter of 11/27/19  CT Renal Stone Study   Narrative CLINICAL DATA:  Dysuria and fever.  EXAM: CT ABDOMEN AND PELVIS WITHOUT CONTRAST  TECHNIQUE:  Multidetector CT imaging of the abdomen and pelvis was performed following the standard protocol without IV contrast.  COMPARISON:  None.  FINDINGS: Lower chest: No acute abnormality.  Hepatobiliary: No focal liver abnormality is seen. No gallstones, gallbladder wall thickening, or biliary dilatation.  Pancreas: Unremarkable. No pancreatic ductal dilatation or surrounding inflammatory changes.  Spleen: Normal in size without  focal abnormality.  Adrenals/Urinary Tract: Normal adrenal glands. No evidence of hydronephrosis. Several bilateral bulky calculi within the kidneys. The ureters are not well visualized but there is no gross evidence of hydroureter. The urinary bladder appears normal.  Stomach/Bowel: Stomach is within normal limits. No evidence of bowel wall thickening, distention, or inflammatory changes.  Vascular/Lymphatic: Aortic atherosclerosis. No enlarged abdominal or pelvic lymph nodes.  Reproductive: Bulky uterus with multiple myometrial masses.  Other: No abdominal wall hernia or abnormality. No abdominopelvic ascites.  Musculoskeletal: Posterior facet arthropathy in the lower lumbosacral spine.  IMPRESSION: 1. No evidence of obstructive uropathy. 2. 4 mm calculus in the left pelvis is difficult to determine whether it represents distal ureteral calculus or a phlebolith. No evidence of hydroureter. 3. Bilateral nephrolithiasis with calculi measuring up to 8 mm. 4. Bulky uterus with multiple myometrial masses, likely representing fibroids.  Aortic Atherosclerosis, advanced for age.  (ICD10-I70.0).   Electronically Signed   By: Fidela Salisbury M.D.   On: 11/27/2019 13:36     Assessment & Plan:    - Dysuria Recent UTI with recurrent dysuria.  She was unable to give a specimen this morning and will drink water and bring 1 back later today.  She will be notified with the results  -Possible left ureteral calculus KUB ordered today and she will be notified with results  - Bilateral nephrolithiasis Schedule CT urogram as her larger calculi may be intraparenchymal   Abbie Sons, MD  [facility] [address] [place], [place] [address] [phone]

## 2020-01-02 ENCOUNTER — Telehealth: Payer: Self-pay | Admitting: *Deleted

## 2020-01-02 NOTE — Telephone Encounter (Signed)
Notified patient as instructed, patient pleased. Discussed follow-up appointments, patient agrees  

## 2020-01-02 NOTE — Telephone Encounter (Signed)
-----   Message from Riki Altes, MD sent at 01/01/2020 11:44 AM EDT ----- KUB reviewed.  Difficult to tell if she has a ureteral stone.  Will await CT results.

## 2020-01-10 ENCOUNTER — Other Ambulatory Visit: Payer: Self-pay

## 2020-01-10 ENCOUNTER — Ambulatory Visit
Admission: RE | Admit: 2020-01-10 | Discharge: 2020-01-10 | Disposition: A | Discharge status: Home / Self Care | Payer: BLUE CROSS/BLUE SHIELD | Source: Ambulatory Visit | Attending: Urology | Admitting: Urology

## 2020-01-10 ENCOUNTER — Telehealth: Payer: Self-pay | Admitting: *Deleted

## 2020-01-10 ENCOUNTER — Telehealth: Payer: Self-pay | Admitting: Urology

## 2020-01-10 ENCOUNTER — Ambulatory Visit: Payer: BLUE CROSS/BLUE SHIELD

## 2020-01-10 DIAGNOSIS — R3129 Other microscopic hematuria: Secondary | ICD-10-CM | POA: Diagnosis present

## 2020-01-10 LAB — POCT I-STAT CREATININE: Creatinine, Ser: 0.7 mg/dL (ref 0.44–1.00)

## 2020-01-10 MED ORDER — IOHEXOL 300 MG/ML  SOLN
75.0000 mL | Freq: Once | INTRAMUSCULAR | Status: AC | PRN
  Administered 2020-01-10: 75 mL via INTRAVENOUS

## 2020-01-10 NOTE — Telephone Encounter (Signed)
Attempted to call for new patient assessment. Message left. 

## 2020-01-10 NOTE — Telephone Encounter (Signed)
CT showed bilateral, nonobstructing renal calculi.  She has 1 stone in her left kidney and 3-4 in her right kidney.  No ureteral stones were identified.  It is unlikely these stones are causing her right flank pain or urinary tract infections.  They could be treated with ureteroscopy however there is no guarantee this would resolve her pain.  If she is interested in ureteroscopy would make a follow-up appointment to discuss further.  Observation is the other option and if she desired would recommend a 46-month PA follow-up  with KUB

## 2020-01-10 NOTE — Progress Notes (Signed)
Patient: Penny Gill  Service Category: E/M  Provider: Gaspar Cola, MD  DOB: 1961/07/30  DOS: 01/11/2020  Location: Office  MRN: [id number]  Setting: Ambulatory outpatient  Referring Provider: Deetta Perla, MD  Type: New Patient  Specialty: Interventional Pain Management  PCP: Sharyne Peach, MD  Location: Remote location  Delivery: TeleHealth     Virtual Encounter - Pain Management PROVIDER NOTE: Information contained herein reflects review and annotations entered in association with encounter. Interpretation of such information and data should be left to medically-trained personnel. Information provided to patient can be located elsewhere in the medical record under "Patient Instructions". Document created using STT-dictation technology, any transcriptional errors that may result from process are unintentional.    Contact & Pharmacy Preferred: [phone] Home: [phone] (home) Mobile: [phone] (mobile) E-mail: [email or web address]  [facility] (N), [place] [facility] [address]) [place] [address] Phone: [phone] Fax: [phone]   Pre-screening note:  Our staff contacted [name] and offered her an "in person", "face-to-face" appointment versus a telephone encounter. She indicated preferring the telephone encounter, at this time.  Primary Reason(s) for Visit: Tele-Encounter for initial evaluation of one or more chronic problems (new to examiner) potentially causing chronic pain, and posing a threat to normal musculoskeletal function. (Level of risk: High) CC: Back Pain (low)  I contacted MAJESTA LEICHTER on 01/11/2020 via telephone.      I clearly identified myself as Gaspar Cola, MD. I verified that I was speaking with the correct person using two identifiers (Name: Penny Gill, and date of birth: 03/25/1961).  Advanced Informed Consent I sought verbal advanced consent from Penny Gill for virtual visit interactions. I informed Penny Gill of possible security and privacy concerns, risks, and limitations associated with providing "not-in-person" medical evaluation and management services. I also informed Penny Gill of the availability of "in-person" appointments. Finally, I informed her that there would be a charge for the virtual visit and that she could be  personally, fully or partially, financially responsible for it. Penny Gill expressed understanding and agreed to proceed.   HPI  Penny Gill is a 59 y.o. year old, female patient, contacted today for an initial evaluation of her chronic pain. She has Sepsis secondary to UTI [facility]); Hypertension; Hypokalemia; Sepsis ([place]); Acute pyelonephritis; Nephrolithiasis; Arthropathy of lumbar facet joint; Dental caries; Hot flashes; Hyperlipemia; Sickle cell trait ([place]); Tobacco dependence; Tinea versicolor; Trochanteric bursitis of hip (Right); Uterine fibroid; Varicose vein; Chronic pain syndrome; Pharmacologic therapy; Disorder of skeletal system; Problems influencing health status; Chronic lower extremity pain (1ry area of Pain) (Right); Chronic low back pain (2ry area of Pain) (Bilateral) (R>L) w/o sciatica; Chronic hip pain (3ry area of Pain) (Bilateral) (R>L); Chronic knee pain (4th area of Pain) (Bilateral) (R>L); and Lumbar facet syndrome (Bilateral) (R>L) on their problem list.   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 Severity: Getting worse, NAS-11 at its worse: 10/10, NAS-11 at its best: 7/10, NAS-11 now: 7/10 and NAS-11 on the average: 7/10 Timing: Not influenced by the time of the day Aggravating Factors: Bending, Kneeling, Lifiting, Motion, Prolonged standing, Squatting, Stooping , Twisting, Walking, Walking uphill and Walking downhill Alleviating Factors: Lying down, Medications and Resting Associated Problems: Numbness and Spasms Quality of Pain:  Aching, Intermittent and Sharp Previous Examinations or Tests: MRI scan, Neurosurgical evaluation and Orthopedic evaluation with [facility] Previous Treatments:  Epidural steroid injections, Narcotic medications and Physical Therapy  According to the patient the primary area of pain is that of the right lower extremity.  The pain is described to go down to her calf through the posterior aspect of the leg.  She denies any prior leg surgeries, physical therapy for the leg, or any recent x-rays of the leg.  She does admit having pain in the hip and the knees.  The patient's secondary area pain is that of the lower back, bilaterally, with the right being worse than the left.  She denies any surgeries.  The patient indicates having had physical therapy several years ago for her back pain which seem to have helped for a while.  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  She indicates having had x-rays done of her lower back.  She also indicates having had an epidural steroid injection done by [facility] approximately 1 month ago which did not provide her with any relief of the pain.  The patient's third area pain is that of the hips, bilaterally, with the right being worse than the left.  The patient denies having had any kind of surgeries for the hip.  She does indicate having had 3 joint injections on the right hip the first 1 having provided her with 3 months of pain relief and no benefit from the second or third injections.  The patient's fourth area pain is that of the knees, bilaterally, with the right being worse than the left.  The patient denies any surgeries, physical therapy, recent x-rays, or any joint injection.  Historic Controlled Substance Pharmacotherapy Review  Current opioid analgesics:  None Highest recorded MME/day: 33.75 mg/day MME/day: 0 mg/day   Historical Monitoring: The patient  reports current drug use. Drug: Marijuana. List of all UDS Test(s): No  results found. List of other Serum/Urine Drug Screening Test(s):  No results found. Historical Background Evaluation: [place] PMP: PDMP reviewed during this encounter. Two (2) year initial data search conducted.             PMP NARX Score Report:  Narcotic: 050 Sedative: 030 Stimulant: 000 Risk Assessment Profile: PMP NARX Overdose Risk Score: 170  Pharmacologic Plan: As per protocol, I have not taken over any controlled substance management, pending the results of ordered tests and/or consults.            Initial impression: Pending review of available data and ordered tests.  Meds   Current Outpatient Medications:  .  amLODipine (NORVASC) 5 MG tablet, Take 5 mg by mouth daily., Disp: , Rfl: 5 .  atorvastatin (LIPITOR) 20 MG tablet, Take 20 mg by mouth at bedtime., Disp: , Rfl: 0 .  baclofen (LIORESAL) 10 MG tablet, Take by mouth., Disp: , Rfl:  .  clobetasol (TEMOVATE) 0.05 % external solution, clobetasol 0.05 % scalp solution, Disp: , Rfl:  .  diclofenac (VOLTAREN) 50 MG EC tablet, Take 1 tablet (50 mg total) by mouth 3 (three) times daily as needed., Disp: , Rfl:  .  ergocalciferol (VITAMIN D2) 1.25 MG (50000 UT) capsule, ergocalciferol (vitamin D2) 1,250 mcg (50,000 unit) capsule, Disp: , Rfl:  .  Fluocinolone Acetonide Body 0.01 % OIL, fluocinolone 0.01 % scalp oil and shower cap  APPLY ON SCALP ONCE DAILY, Disp: , Rfl:  .  gabapentin (NEURONTIN) 300 MG capsule, Take 300 mg by mouth 2 (two) times daily., Disp: , Rfl:  .  mirtazapine (REMERON) 30 MG tablet, mirtazapine 30 mg  tablet, Disp: , Rfl:  .  nortriptyline (PAMELOR) 25 MG capsule, nortriptyline 25 mg capsule, Disp: , Rfl:  .  Nutritional Supplements (ENSURE ACTIVE HIGH PROTEIN) LIQD, Take 237 mLs by mouth in the morning and at bedtime., Disp: , Rfl:  .  omeprazole (PRILOSEC) 20 MG capsule, omeprazole 20 mg capsule,delayed release  TAKE 1 CAPSULE BY MOUTH ONCE DAILY. NEEDS OFFICE VISIT FOR FURTHER REFILLS., Disp: , Rfl:  .   Prenatal Vit-Fe Fumarate-FA (PREPLUS) 27-1 MG TABS, Take 1 tablet by mouth daily., Disp: , Rfl: 4 .  traZODone (DESYREL) 150 MG tablet, Take 150 mg by mouth at bedtime., Disp: , Rfl:   ROS  Cardiovascular: Daily Aspirin intake Pulmonary or Respiratory: No reported pulmonary signs or symptoms such as wheezing and difficulty taking a deep full breath (Asthma), difficulty blowing air out (Emphysema), coughing up mucus (Bronchitis), persistent dry cough, or temporary stoppage of breathing during sleep Neurological: No reported neurological signs or symptoms such as seizures, abnormal skin sensations, urinary and/or fecal incontinence, being born with an abnormal open spine and/or a tethered spinal cord Psychological-Psychiatric: Anxiousness, Depressed and Attempted suicide Gastrointestinal: No reported gastrointestinal signs or symptoms such as vomiting or evacuating blood, reflux, heartburn, alternating episodes of diarrhea and constipation, inflamed or scarred liver, or pancreas or irrregular and/or infrequent bowel movements Genitourinary: Kidney disease and Passing kidney stones Hematological: Abnormal red blood cell shape problems (Sickle Cell disease or trait) Endocrine: No reported endocrine signs or symptoms such as high or low blood sugar, rapid heart rate due to high thyroid levels, obesity or weight gain due to slow thyroid or thyroid disease Rheumatologic: No reported rheumatological signs and symptoms such as fatigue, joint pain, tenderness, swelling, redness, heat, stiffness, decreased range of motion, with or without associated rash Musculoskeletal: Negative for myasthenia gravis, muscular dystrophy, multiple sclerosis or malignant hyperthermia Work History: Out of work due to pain  Allergies  Ms. Emmerich has No Known Allergies.  Laboratory Chemistry Profile   Renal Lab Results  Component Value Date   BUN 9 11/29/2019   CREATININE 0.70 01/10/2020   GFRAA >60 11/29/2019   GFRNONAA  >60 11/29/2019   SPECGRAV 1.020 12/30/2019   PHUR 5.5 12/30/2019   PROTEINUR Negative 12/30/2019     Electrolytes Lab Results  Component Value Date   NA 140 11/29/2019   K 4.6 11/29/2019   CL 109 11/29/2019   CALCIUM 8.6 (L) 11/29/2019   MG 1.6 (L) 11/29/2019     Hepatic Lab Results  Component Value Date   AST 18 07/05/2014   ALT 30 07/05/2014   ALBUMIN 3.7 07/05/2014   ALKPHOS 117 (H) 07/05/2014     ID Lab Results  Component Value Date   HIV NON REACTIVE 11/28/2019   [place] NEGATIVE 11/27/2019     Bone No results found.   Endocrine Lab Results  Component Value Date   GLUCOSE 191 (H) 11/29/2019   GLUCOSEU Negative 12/30/2019   TSH 0.585 11/28/2019     Neuropathy Lab Results  Component Value Date   HIV NON REACTIVE 11/28/2019     CNS No results found.   Inflammation (CRP: Acute  ESR: Chronic) Lab Results  Component Value Date   LATICACIDVEN 1.6 11/27/2019     Rheumatology No results found.   Coagulation Lab Results  Component Value Date   INR 1.2 11/28/2019   LABPROT 15.4 (H) 11/28/2019   PLT 242 11/29/2019     Cardiovascular Lab Results  Component Value Date   TROPONINI < 0.02  07/05/2014   HGB 10.7 (L) 11/29/2019   HCT 31.7 (L) 11/29/2019     Screening Lab Results  Component Value Date   SARSCOV2NAA NEGATIVE 11/27/2019   HIV NON REACTIVE 11/28/2019     Cancer No results found.   Allergens No results found.     Note: Lab results reviewed.  Imaging Review  Lumbosacral Imaging: Lumbar DG 2-3 views:  Results for orders placed during the hospital encounter of 07/18/15  DG Lumbar Spine 2-3 Views   Narrative CLINICAL DATA:  Low back pain this morning, constant for 1 year but worsened since last night. Weakness and pain radiating down her legs at times. No known injury.  EXAM: LUMBAR SPINE - 2-3 VIEW  COMPARISON:  None.  FINDINGS: There is a mild levoscoliosis which may be positional in nature. Osseous alignment  appears otherwise normal. Minimal degenerative change noted at the thoracolumbar junction with very slight disc space narrowings and osseous spurring. No large osteophytes or other secondary signs of advanced degenerative disc disease.  There is no fracture line or displaced fracture fragment identified. No acute-appearing cortical irregularity or osseous lesion.  Atherosclerotic changes noted along the walls of the infrarenal abdominal aorta. Small dense oval calcification within the right abdomen is of uncertain etiology, possibly a gallstone but more likely a soft tissue calcification or chronic dystrophic calcification in the liver. Paravertebral soft tissues are otherwise unremarkable.  IMPRESSION: 1. Very minimal degenerative change at the thoracolumbar junction. No significant degenerative change seen. 2. Mild levoscoliosis which may be merely positional in nature. 3. No acute/significant findings.   Electronically Signed   By: Franki Cabot M.D.   On: 07/18/2015 07:59    Complexity Note: Imaging results reviewed. Results shared with Ms. Boivin, using Layman's terms.                        PFSH  Drug: Ms. Cuello  reports current drug use. Drug: Marijuana. Alcohol:  reports current alcohol use. Tobacco:  reports that she has been smoking cigarettes. She has been smoking about 0.50 packs per day. She has never used smokeless tobacco. Medical:  has a past medical history of Depression, Hyperlipidemia, Hypertension, Kidney stone, and Sickle cell trait ([place]). Family: family history includes Breast cancer (age of onset: 37) in her maternal aunt.  Past Surgical History:  Procedure Laterality Date  . TUBAL LIGATION     Active Ambulatory Problems    Diagnosis Date Noted  . Sepsis secondary to UTI ([place]) 11/27/2019  . Hypertension 09/29/2012  . Hypokalemia   . Sepsis ([place]) 11/27/2019  . Acute pyelonephritis 11/27/2019  . Nephrolithiasis 12/30/2019  . Arthropathy of lumbar  facet joint 08/02/2015  . Dental caries 01/11/2020  . Hot flashes 10/26/2012  . Hyperlipemia 09/29/2012  . Sickle cell trait ([place]) 10/26/2012  . Tobacco dependence 01/11/2020  . Tinea versicolor 09/29/2012  . Trochanteric bursitis of hip (Right) 06/27/2019  . Uterine fibroid 10/26/2012  . Varicose vein 01/11/2020  . Chronic pain syndrome 01/11/2020  . Pharmacologic therapy 01/11/2020  . Disorder of skeletal system 01/11/2020  . Problems influencing health status 01/11/2020  . Chronic lower extremity pain (1ry area of Pain) (Right) 01/11/2020  . Chronic low back pain (2ry area of Pain) (Bilateral) (R>L) w/o sciatica 01/11/2020  . Chronic hip pain (3ry area of Pain) (Bilateral) (R>L) 01/11/2020  . Chronic knee pain (4th area of Pain) (Bilateral) (R>L) 01/11/2020  . Lumbar facet syndrome (Bilateral) (R>L) 01/11/2020   Resolved Ambulatory  Problems    Diagnosis Date Noted  . Depression    Past Medical History:  Diagnosis Date  . Hyperlipidemia   . Kidney stone    Assessment  Primary Diagnosis & Pertinent Problem List: The primary encounter diagnosis was Chronic lower extremity pain (1ry area of Pain) (Right). Diagnoses of Chronic low back pain (2ry area of Pain) (Bilateral) (R>L) w/o sciatica, Chronic hip pain (3ry area of Pain) (Bilateral) (R>L), Chronic knee pain (4th area of Pain) (Bilateral) (R>L), Lumbar facet syndrome (Bilateral) (R>L), Chronic pain syndrome, Pharmacologic therapy, Disorder of skeletal system, and Problems influencing health status were also pertinent to this visit.  Visit Diagnosis (New problems to examiner): 1. Chronic lower extremity pain (1ry area of Pain) (Right)   2. Chronic low back pain (2ry area of Pain) (Bilateral) (R>L) w/o sciatica   3. Chronic hip pain (3ry area of Pain) (Bilateral) (R>L)   4. Chronic knee pain (4th area of Pain) (Bilateral) (R>L)   5. Lumbar facet syndrome (Bilateral) (R>L)   6. Chronic pain syndrome   7. Pharmacologic therapy    8. Disorder of skeletal system   9. Problems influencing health status    Plan of Care (Initial workup plan)  Note: Ms. Axley was reminded that as per protocol, today's visit has been an evaluation only. We have not taken over the patient's controlled substance management.  Problem-specific plan: No problem-specific Assessment & Plan notes found for this encounter.   Lab Orders     Compliance Drug Analysis, Ur     Magnesium     Vitamin B12     Sedimentation rate     25-Hydroxy vitamin D Lcms D2+D3     C-reactive protein  Imaging Orders     DG HIP UNILAT W OR W/O PELVIS 2-3 VIEWS RIGHT     DG HIP UNILAT W OR W/O PELVIS 2-3 VIEWS LEFT     DG Knee 1-2 Views Right     DG Knee 1-2 Views Left     DG Lumbar Spine Complete W/Bend Referral Orders  No referral(s) requested today   Procedure Orders    No procedure(s) ordered today   Pharmacotherapy (current): Medications ordered:  No orders of the defined types were placed in this encounter.    Pharmacological management options:  Opioid Analgesics: The patient was informed that there is no guarantee that she would be a candidate for opioid analgesics. The decision will be made following CDC guidelines. This decision will be based on the results of diagnostic studies, as well as Ms. Capwell's risk profile.   Membrane stabilizer: To be determined at a later time  Muscle relaxant: To be determined at a later time  NSAID: To be determined at a later time  Other analgesic(s): To be determined at a later time   Interventional management options: Ms. Gianfrancesco was informed that there is no guarantee that she would be a candidate for interventional therapies. The decision will be based on the results of diagnostic studies, as well as Ms. Dimitrov's risk profile.  Procedure(s) under consideration:  Pending results of patient's studies.   Provider-requested follow-up: Return for (VV), (2V), (s/p Tests).  Future Appointments  Date Time  Provider [place]  01/11/2020  2:30 PM Milinda Pointer, MD [facility] None  02/06/2020  2:30 PM Stoioff, Ronda Fairly, MD [facility] None   Total duration of encounter: 30 minutes.  Primary Care Physician: Sharyne Peach, MD Note by: Gaspar Cola, MD Date: 01/11/2020; Time: 2:24 PM

## 2020-01-11 ENCOUNTER — Telehealth: Payer: Self-pay | Admitting: *Deleted

## 2020-01-11 ENCOUNTER — Encounter: Payer: Self-pay | Admitting: Pain Medicine

## 2020-01-11 ENCOUNTER — Ambulatory Visit: Payer: BLUE CROSS/BLUE SHIELD | Attending: Pain Medicine | Admitting: Pain Medicine

## 2020-01-11 VITALS — Ht 60.0 in | Wt 85.0 lb

## 2020-01-11 DIAGNOSIS — K029 Dental caries, unspecified: Secondary | ICD-10-CM | POA: Insufficient documentation

## 2020-01-11 DIAGNOSIS — M899 Disorder of bone, unspecified: Secondary | ICD-10-CM | POA: Insufficient documentation

## 2020-01-11 DIAGNOSIS — M545 Low back pain, unspecified: Secondary | ICD-10-CM | POA: Insufficient documentation

## 2020-01-11 DIAGNOSIS — G894 Chronic pain syndrome: Secondary | ICD-10-CM | POA: Insufficient documentation

## 2020-01-11 DIAGNOSIS — M25561 Pain in right knee: Secondary | ICD-10-CM

## 2020-01-11 DIAGNOSIS — M79604 Pain in right leg: Secondary | ICD-10-CM | POA: Diagnosis not present

## 2020-01-11 DIAGNOSIS — F17200 Nicotine dependence, unspecified, uncomplicated: Secondary | ICD-10-CM | POA: Insufficient documentation

## 2020-01-11 DIAGNOSIS — G8929 Other chronic pain: Secondary | ICD-10-CM

## 2020-01-11 DIAGNOSIS — M25551 Pain in right hip: Secondary | ICD-10-CM

## 2020-01-11 DIAGNOSIS — M25552 Pain in left hip: Secondary | ICD-10-CM | POA: Insufficient documentation

## 2020-01-11 DIAGNOSIS — Z79899 Other long term (current) drug therapy: Secondary | ICD-10-CM | POA: Insufficient documentation

## 2020-01-11 DIAGNOSIS — I8390 Asymptomatic varicose veins of unspecified lower extremity: Secondary | ICD-10-CM | POA: Insufficient documentation

## 2020-01-11 DIAGNOSIS — M25562 Pain in left knee: Secondary | ICD-10-CM

## 2020-01-11 DIAGNOSIS — Z789 Other specified health status: Secondary | ICD-10-CM

## 2020-01-11 DIAGNOSIS — M47816 Spondylosis without myelopathy or radiculopathy, lumbar region: Secondary | ICD-10-CM

## 2020-01-11 NOTE — Telephone Encounter (Signed)
Notified patient as instructed. She would like ureteroscopy ,

## 2020-02-05 ENCOUNTER — Emergency Department: Payer: BLUE CROSS/BLUE SHIELD

## 2020-02-05 ENCOUNTER — Other Ambulatory Visit: Payer: Self-pay

## 2020-02-05 DIAGNOSIS — E119 Type 2 diabetes mellitus without complications: Secondary | ICD-10-CM | POA: Diagnosis not present

## 2020-02-05 DIAGNOSIS — Y929 Unspecified place or not applicable: Secondary | ICD-10-CM | POA: Insufficient documentation

## 2020-02-05 DIAGNOSIS — W1830XA Fall on same level, unspecified, initial encounter: Secondary | ICD-10-CM | POA: Insufficient documentation

## 2020-02-05 DIAGNOSIS — Z79899 Other long term (current) drug therapy: Secondary | ICD-10-CM | POA: Insufficient documentation

## 2020-02-05 DIAGNOSIS — F17210 Nicotine dependence, cigarettes, uncomplicated: Secondary | ICD-10-CM | POA: Diagnosis not present

## 2020-02-05 DIAGNOSIS — Y939 Activity, unspecified: Secondary | ICD-10-CM | POA: Insufficient documentation

## 2020-02-05 DIAGNOSIS — Y999 Unspecified external cause status: Secondary | ICD-10-CM | POA: Diagnosis not present

## 2020-02-05 DIAGNOSIS — Z7984 Long term (current) use of oral hypoglycemic drugs: Secondary | ICD-10-CM | POA: Insufficient documentation

## 2020-02-05 DIAGNOSIS — S62655A Nondisplaced fracture of medial phalanx of left ring finger, initial encounter for closed fracture: Secondary | ICD-10-CM | POA: Diagnosis not present

## 2020-02-05 DIAGNOSIS — S20212A Contusion of left front wall of thorax, initial encounter: Secondary | ICD-10-CM | POA: Insufficient documentation

## 2020-02-05 DIAGNOSIS — T07XXXA Unspecified multiple injuries, initial encounter: Secondary | ICD-10-CM | POA: Diagnosis present

## 2020-02-05 DIAGNOSIS — D573 Sickle-cell trait: Secondary | ICD-10-CM | POA: Insufficient documentation

## 2020-02-05 LAB — CBC
HCT: 36.4 % (ref 36.0–46.0)
Hemoglobin: 12.3 g/dL (ref 12.0–15.0)
MCH: 29.9 pg (ref 26.0–34.0)
MCHC: 33.8 g/dL (ref 30.0–36.0)
MCV: 88.6 fL (ref 80.0–100.0)
Platelets: 261 10*3/uL (ref 150–400)
RBC: 4.11 MIL/uL (ref 3.87–5.11)
RDW: 13.4 % (ref 11.5–15.5)
WBC: 7.6 10*3/uL (ref 4.0–10.5)
nRBC: 0 % (ref 0.0–0.2)

## 2020-02-05 LAB — BASIC METABOLIC PANEL
Anion gap: 11 (ref 5–15)
BUN: 15 mg/dL (ref 6–20)
CO2: 27 mmol/L (ref 22–32)
Calcium: 9.2 mg/dL (ref 8.9–10.3)
Chloride: 103 mmol/L (ref 98–111)
Creatinine, Ser: 0.86 mg/dL (ref 0.44–1.00)
GFR calc Af Amer: >60 mL/min (ref >60)
GFR calc non Af Amer: >60 mL/min (ref >60)
Glucose, Bld: 171 mg/dL — ABNORMAL HIGH (ref 70–99)
Potassium: 3.9 mmol/L (ref 3.5–5.1)
Sodium: 141 mmol/L (ref 135–145)

## 2020-02-05 LAB — TROPONIN I (HIGH SENSITIVITY): Troponin I (High Sensitivity): 7 ng/L (ref <18)

## 2020-02-05 NOTE — ED Triage Notes (Signed)
Patient reports mechanical fall. Patient c/o left hand pain and chest pain post fall. Patient drowsy - reports she has already taken her bedtime medications.

## 2020-02-05 NOTE — ED Notes (Signed)
Per [facility], pt had a fall around 1700 and called EMS for chest pain. Per EMS, pt's VS are WDL and pt reported taking "all of my nighttime medicines" before calling 911.

## 2020-02-06 ENCOUNTER — Emergency Department: Payer: BLUE CROSS/BLUE SHIELD

## 2020-02-06 ENCOUNTER — Emergency Department
Admission: EM | Admit: 2020-02-06 | Discharge: 2020-02-06 | Disposition: A | Discharge status: Home / Self Care | Payer: BLUE CROSS/BLUE SHIELD | Attending: Emergency Medicine | Admitting: Emergency Medicine

## 2020-02-06 ENCOUNTER — Ambulatory Visit: Payer: Self-pay | Admitting: Urology

## 2020-02-06 DIAGNOSIS — E119 Type 2 diabetes mellitus without complications: Secondary | ICD-10-CM

## 2020-02-06 DIAGNOSIS — S62655A Nondisplaced fracture of medial phalanx of left ring finger, initial encounter for closed fracture: Secondary | ICD-10-CM

## 2020-02-06 DIAGNOSIS — W19XXXA Unspecified fall, initial encounter: Secondary | ICD-10-CM

## 2020-02-06 DIAGNOSIS — S20212A Contusion of left front wall of thorax, initial encounter: Secondary | ICD-10-CM

## 2020-02-06 LAB — TROPONIN I (HIGH SENSITIVITY): Troponin I (High Sensitivity): 6 ng/L (ref <18)

## 2020-02-06 MED ORDER — ACETAMINOPHEN 500 MG PO TABS
1000.0000 mg | ORAL_TABLET | Freq: Once | ORAL | Status: AC
  Administered 2020-02-06: 1000 mg via ORAL
  Filled 2020-02-06: qty 2

## 2020-02-06 MED ORDER — OXYCODONE HCL 5 MG PO TABS
5.0000 mg | ORAL_TABLET | Freq: Once | ORAL | Status: AC
  Administered 2020-02-06: 5 mg via ORAL
  Filled 2020-02-06: qty 1

## 2020-02-06 MED ORDER — METFORMIN HCL 500 MG PO TABS: 500.0000 mg | ORAL_TABLET | Freq: Two times a day (BID) | ORAL | 1 refills | Status: DC

## 2020-02-06 NOTE — ED Notes (Signed)
Pt alert and oriented X 4, stable for discharge. RR even and unlabored, color WNL. Discussed discharge instructions and follow up when appropriate. Instructed to follow up with ER for any life threatening symptoms or concerns that patient or family of patient may have  

## 2020-02-06 NOTE — ED Provider Notes (Signed)
[facility] Emergency Department Provider Note  ____________________________________________  Time seen: Approximately 5:34 AM  I have reviewed the triage vital signs and the nursing notes.   HISTORY  Chief Complaint Fall and Chest Pain   HPI Penny Gill is a 59 y.o. female with a history of hypertension, hyperlipidemia, chronic pain, sickle cell trait, depression who presents for evaluation of left hand and chest pain.  Patient reports that she had a fall yesterday at 5 PM.  She reports that her knees gave out and she fell forward.  She reports hitting her chest on the wall.  She did not have pain immediately after the fall but several hours later started having severe pain in her chest which made it hard for her to breathe.  She is also complaining of moderate pain of her left ring finger which is worse with movement.  She denies head trauma or LOC.  She is not on blood thinners.   Past Medical History:  Diagnosis Date  . Depression   . Hyperlipidemia   . Hypertension   . Kidney stone   . Sickle cell trait [facility])     Patient Active Problem List   Diagnosis Date Noted  . Dental caries 01/11/2020  . Tobacco dependence 01/11/2020  . Varicose vein 01/11/2020  . Chronic pain syndrome 01/11/2020  . Pharmacologic therapy 01/11/2020  . Disorder of skeletal system 01/11/2020  . Problems influencing health status 01/11/2020  . Chronic lower extremity pain (1ry area of Pain) (Right) 01/11/2020  . Chronic low back pain (2ry area of Pain) (Bilateral) (R>L) w/o sciatica 01/11/2020  . Chronic hip pain (3ry area of Pain) (Bilateral) (R>L) 01/11/2020  . Chronic knee pain (4th area of Pain) (Bilateral) (R>L) 01/11/2020  . Lumbar facet syndrome (Bilateral) (R>L) 01/11/2020  . Nephrolithiasis 12/30/2019  . Sepsis secondary to UTI (HCC) 11/27/2019  . Sepsis (HCC) 11/27/2019  . Acute pyelonephritis 11/27/2019  . Hypokalemia   . Trochanteric bursitis of hip  (Right) 06/27/2019  . 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 Hot flashes 10/26/2012  . Sickle cell trait (HCC) 10/26/2012  . Uterine fibroid 10/26/2012  . Hypertension 09/29/2012  . Hyperlipemia 09/29/2012  . Tinea versicolor 09/29/2012    Past Surgical History:  Procedure Laterality Date  . TUBAL LIGATION      Prior to Admission medications   Medication Sig Start Date End Date Taking? Authorizing Provider  amLODipine (NORVASC) 5 MG tablet Take 5 mg by mouth daily. 10/02/17   [provider]  atorvastatin (LIPITOR) 20 MG tablet Take 20 mg by mouth at bedtime. 10/02/17   [provider]  baclofen (LIORESAL) 10 MG tablet Take by mouth. 12/25/19   [provider]  clobetasol (TEMOVATE) 0.05 % external solution clobetasol 0.05 % scalp solution    [provider]  diclofenac (VOLTAREN) 50 MG EC tablet Take 1 tablet (50 mg total) by mouth 3 (three) times daily as needed. 11/29/19   Darlin Priestly, MD  ergocalciferol (VITAMIN D2) 1.25 MG (50000 UT) capsule ergocalciferol (vitamin D2) 1,250 mcg (50,000 unit) capsule 12/25/19   [provider]  Fluocinolone Acetonide Body 0.01 % OIL fluocinolone 0.01 % scalp oil and shower cap  APPLY ON SCALP ONCE DAILY 12/27/19   [provider]  gabapentin (NEURONTIN) 300 MG capsule Take 300 mg by mouth 2 (two) times daily.    [provider]  metFORMIN (GLUCOPHAGE) 500 MG tablet Take 1 tablet (500 mg total) by mouth 2 (two)  times daily with a meal. 02/06/20 02/05/21  Don Perking, [place], MD  mirtazapine (REMERON) 30 MG tablet mirtazapine 30 mg tablet    [provider]  nortriptyline (PAMELOR) 25 MG capsule nortriptyline 25 mg capsule    [provider]  Nutritional Supplements (ENSURE ACTIVE HIGH PROTEIN) LIQD Take 237 mLs by mouth in the morning and at bedtime. 05/08/19 05/07/20  [provider]  omeprazole (PRILOSEC) 20 MG capsule omeprazole 20 mg capsule,delayed release  TAKE 1  CAPSULE BY MOUTH ONCE DAILY. NEEDS OFFICE VISIT FOR FURTHER REFILLS.    [provider]  Prenatal Vit-Fe Fumarate-FA (PREPLUS) 27-1 MG TABS Take 1 tablet by mouth daily. 09/30/17   [provider]  traZODone (DESYREL) 150 MG tablet Take 150 mg by mouth at bedtime. 12/25/19   [provider]    Allergies Patient has no known allergies.  Family History  Problem Relation Age of Onset  . Breast cancer Maternal Aunt 20    Social History Social History   Tobacco Use  . Smoking status: Current Every Day Smoker    Packs/day: 0.50    Types: Cigarettes  . Smokeless tobacco: Never Used  Vaping Use  . Vaping Use: Never used  Substance Use Topics  . Alcohol use: Yes    Comment: rare  . Drug use: Yes    Types: Marijuana    Comment: History of cocaine abuse    Review of Systems  Constitutional: Negative for fever. Eyes: Negative for visual changes. ENT: Negative for facial injury or neck injury Cardiovascular: Negative for chest injury. + chest wall pain Respiratory: Negative for shortness of breath.  Gastrointestinal: Negative for abdominal pain or injury. Genitourinary: Negative for dysuria. Musculoskeletal: Negative for back injury, + Left finger pain Skin: Negative for laceration/abrasions. Neurological: Negative for head injury.   ____________________________________________   PHYSICAL EXAM:  VITAL SIGNS: ED Triage Vitals  Enc Vitals Group     BP 02/05/20 2232 116/72     Pulse Rate 02/05/20 2232 88     Resp 02/05/20 2232 18     Temp 02/05/20 2232 98.9 F (37.2 C)     Temp Source 02/06/20 0108 Oral     SpO2 02/05/20 2232 99 %     Weight 02/05/20 2235 85 lb 1.6 oz (38.6 kg)     Height 02/05/20 2235 5' (1.524 m)     Head Circumference --      Peak Flow --      Pain Score 02/05/20 2234 10     Pain Loc --      Pain Edu? --      Excl. in GC? --     Full spinal precautions maintained throughout the trauma exam. Constitutional: Alert and  oriented. No acute distress. Does not appear intoxicated. HEENT Head: Normocephalic and atraumatic. Face: No facial bony tenderness. Stable midface Ears: No hemotympanum bilaterally. No Battle sign Eyes: No eye injury. PERRL. No raccoon eyes Nose: Nontender. No epistaxis. No rhinorrhea Mouth/Throat: Mucous membranes are moist. No oropharyngeal blood. No dental injury. Airway patent without stridor. Normal voice. Neck: no C-collar. No midline c-spine tenderness.  Cardiovascular: Normal rate, regular rhythm. Normal and symmetric distal pulses are present in all extremities. Pulmonary/Chest: Chest wall is stable significantly tender to palpation on the left anterior lateral chest wall with no bruising. Normal respiratory effort. Breath sounds are normal. No crepitus.  Abdominal: Soft, nontender, non distended. Musculoskeletal: Swelling of the left fourth PIP joint which is tender to palpation.  Nontender with normal  full range of motion in all extremities. No deformities. No thoracic or lumbar midline spinal tenderness. Pelvis is stable. Skin: Skin is warm, dry and intact. No abrasions or contutions. Psychiatric: Speech and behavior are appropriate. Neurological: Normal speech and language. Moves all extremities to command. No gross focal neurologic deficits are appreciated.  Glascow Coma Score: 4 - Opens eyes on own 6 - Follows simple motor commands 5 - Alert and oriented GCS: 15   ____________________________________________   LABS (all labs ordered are listed, but only abnormal results are displayed)  Labs Reviewed  BASIC METABOLIC PANEL - Abnormal; Notable for the following components:      Result Value   Glucose, Bld 171 (*)    All other components within normal limits  CBC  TROPONIN I (HIGH SENSITIVITY)  TROPONIN I (HIGH SENSITIVITY)   ____________________________________________  EKG  ED ECG REPORT I, Rudene Re, the attending physician, personally viewed and  interpreted this ECG.  Normal sinus rhythm, rate of 86, normal intervals, normal axis, no ST elevations or depressions.  Anterior Q waves.  Unchanged from prior. ____________________________________________  RADIOLOGY  I have personally reviewed the images performed during this visit and I agree with the Radiologist's read.   Interpretation by Radiologist:  DG Chest 2 View  Result Date: 02/05/2020 CLINICAL DATA:  Fall EXAM: CHEST - 2 VIEW COMPARISON:  07/18/2013 FINDINGS: Calcified granulomas in the left lower lung. Lungs otherwise clear. Heart is normal size. No effusions or acute bony abnormality. IMPRESSION: No active cardiopulmonary disease. Electronically Signed   By: Rolm Baptise M.D.   On: 02/05/2020 23:06   CT Chest Wo Contrast  Result Date: 02/06/2020 CLINICAL DATA:  Mechanical fall with chest pain EXAM: CT CHEST WITHOUT CONTRAST TECHNIQUE: Multidetector CT imaging of the chest was performed following the standard protocol without IV contrast. COMPARISON:  None. FINDINGS: Cardiovascular: Normal heart size. No pericardial effusion. No acute vascular finding Mediastinum/Nodes: Negative for adenopathy or mass Lungs/Pleura: Centrilobular emphysema. There is no edema, consolidation, effusion, or pneumothorax. Calcified granulomas in the lower lobes Upper Abdomen: 6 mm left renal calculus. Musculoskeletal: No acute or aggressive finding IMPRESSION: No acute or posttraumatic finding. Emphysema (ICD10-J43.9). Electronically Signed   By: Monte Fantasia M.D.   On: 02/06/2020 06:27   DG Hand Complete Left  Result Date: 02/05/2020 CLINICAL DATA:  Fall, left middle and ring finger pain EXAM: LEFT HAND - COMPLETE 3+ VIEW COMPARISON:  None FINDINGS: Fracture at the anterior base of the left ring finger middle phalanx. No subluxation or dislocation. No additional fracture. Joint spaces maintained. IMPRESSION: Fracture along the anterior base of the left ring finger middle phalanx. Electronically  Signed   By: Rolm Baptise M.D.   On: 02/05/2020 23:07     ____________________________________________   PROCEDURES  Procedure(s) performed:yes .1-3 Lead EKG Interpretation Performed by: Rudene Re, MD Authorized by: Rudene Re, MD     Interpretation: normal     ECG rate assessment: normal     Rhythm: sinus rhythm     Ectopy: none     Critical Care performed:  None ____________________________________________   INITIAL IMPRESSION / ASSESSMENT AND PLAN / ED COURSE  59 y.o. female with a history of hypertension, hyperlipidemia, chronic pain, sickle cell trait, depression who presents for evaluation of left hand and chest pain status post mechanical fall.  Patient has significant tenderness palpation of the left anterior lateral chest wall with no bruising or obvious deformities.  Chest x-ray visualized by me showing no pneumothorax or broken  ribs, confirmed by radiology.  Patient is so tender that I will send her for CT scan to rule out occult rib fractures.  She also has swelling of the left fourth PIP and the x-ray confirms a fracture of the middle phalanx, confirmed by radiology.  This is a closed fracture.  Patient was placed on a splint.  EKG showing no acute ischemic changes.  2 troponins are negative.  Labs showing hyperglycemia no evidence of DKA.  Patient reports that she does not have a history of diabetes.  Blood work since 2019 here in the hospital has shown elevated glucose anywhere between 150s to the 200s. Will provide a prescription for metformin.  Recommended follow-up with PCP for further evaluation and diagnosis of diabetes.  Old medical records reviewed.  Patient placed on telemetry for close monitoring.  Will give p.o. Tylenol and oxycodone for pain.  _________________________ 6:31 AM on 02/06/2020 -----------------------------------------  CT showing no occult rib fractures or pneumothorax, confirmed by radiology.  Patient now stable for discharge  home with follow-up with PCP.  Discussed my standard return precautions     ____________________________________________  Please note:  Patient was evaluated in Emergency Department today for the symptoms described in the history of present illness. Patient was evaluated in the context of the global COVID-19 pandemic, which necessitated consideration that the patient might be at risk for infection with the SARS-CoV-2 virus that causes COVID-19. Institutional protocols and algorithms that pertain to the evaluation of patients at risk for COVID-19 are in a state of rapid change based on information released by regulatory bodies including the CDC and federal and state organizations. These policies and algorithms were followed during the patient's care in the ED.  Some ED evaluations and interventions may be delayed as a result of limited staffing during the pandemic.   ____________________________________________   FINAL CLINICAL IMPRESSION(S) / ED DIAGNOSES   Final diagnoses:  Fall, initial encounter  Nondisplaced fracture of middle phalanx of left ring finger, initial encounter for closed fracture  Contusion of left chest wall, initial encounter  Type 2 diabetes mellitus without complication, unspecified whether long term insulin use (HCC)      NEW MEDICATIONS STARTED DURING THIS VISIT:  ED Discharge Orders         Ordered    metFORMIN (GLUCOPHAGE) 500 MG tablet  2 times daily with meals     Discontinue  Reprint     02/06/20 0618           Note:  This document was prepared using Dragon voice recognition software and may include unintentional dictation errors.    Don Perking, [place], MD 02/06/20 [phone]

## 2020-02-07 ENCOUNTER — Encounter: Payer: Self-pay | Admitting: Urology

## 2020-04-03 ENCOUNTER — Other Ambulatory Visit: Payer: Self-pay

## 2020-04-03 ENCOUNTER — Encounter: Payer: Self-pay | Admitting: Emergency Medicine

## 2020-04-03 DIAGNOSIS — R197 Diarrhea, unspecified: Secondary | ICD-10-CM | POA: Insufficient documentation

## 2020-04-03 DIAGNOSIS — R2681 Unsteadiness on feet: Secondary | ICD-10-CM | POA: Insufficient documentation

## 2020-04-03 DIAGNOSIS — Z5321 Procedure and treatment not carried out due to patient leaving prior to being seen by health care provider: Secondary | ICD-10-CM | POA: Insufficient documentation

## 2020-04-03 DIAGNOSIS — R112 Nausea with vomiting, unspecified: Secondary | ICD-10-CM | POA: Insufficient documentation

## 2020-04-03 LAB — COMPREHENSIVE METABOLIC PANEL
ALT: 24 U/L (ref 0–44)
AST: 22 U/L (ref 15–41)
Albumin: 4.2 g/dL (ref 3.5–5.0)
Alkaline Phosphatase: 87 U/L (ref 38–126)
Anion gap: 10 (ref 5–15)
BUN: 17 mg/dL (ref 6–20)
CO2: 27 mmol/L (ref 22–32)
Calcium: 9.1 mg/dL (ref 8.9–10.3)
Chloride: 103 mmol/L (ref 98–111)
Creatinine, Ser: 0.75 mg/dL (ref 0.44–1.00)
GFR calc Af Amer: >60 mL/min (ref >60)
GFR calc non Af Amer: >60 mL/min (ref >60)
Glucose, Bld: 116 mg/dL — ABNORMAL HIGH (ref 70–99)
Potassium: 3.1 mmol/L — ABNORMAL LOW (ref 3.5–5.1)
Sodium: 140 mmol/L (ref 135–145)
Total Bilirubin: 1 mg/dL (ref 0.3–1.2)
Total Protein: 7.4 g/dL (ref 6.5–8.1)

## 2020-04-03 LAB — CBC WITH DIFFERENTIAL/PLATELET
Abs Immature Granulocytes: 0.03 10*3/uL (ref 0.00–0.07)
Basophils Absolute: 0 10*3/uL (ref 0.0–0.1)
Basophils Relative: 0 %
Eosinophils Absolute: 0 10*3/uL (ref 0.0–0.5)
Eosinophils Relative: 0 %
HCT: 39.2 % (ref 36.0–46.0)
Hemoglobin: 13 g/dL (ref 12.0–15.0)
Immature Granulocytes: 0 %
Lymphocytes Relative: 17 %
Lymphs Abs: 2 10*3/uL (ref 0.7–4.0)
MCH: 29.5 pg (ref 26.0–34.0)
MCHC: 33.2 g/dL (ref 30.0–36.0)
MCV: 89.1 fL (ref 80.0–100.0)
Monocytes Absolute: 0.8 10*3/uL (ref 0.1–1.0)
Monocytes Relative: 7 %
Neutro Abs: 8.8 10*3/uL — ABNORMAL HIGH (ref 1.7–7.7)
Neutrophils Relative %: 76 %
Platelets: 340 10*3/uL (ref 150–400)
RBC: 4.4 MIL/uL (ref 3.87–5.11)
RDW: 13.1 % (ref 11.5–15.5)
WBC: 11.7 10*3/uL — ABNORMAL HIGH (ref 4.0–10.5)
nRBC: 0 % (ref 0.0–0.2)

## 2020-04-03 LAB — LIPASE, BLOOD: Lipase: 24 U/L (ref 11–51)

## 2020-04-03 NOTE — ED Triage Notes (Signed)
Patient ambulatory to triage with steady gait, without difficulty or distress noted; pt reports since last night having N/V/D

## 2020-04-04 ENCOUNTER — Emergency Department
Admission: EM | Admit: 2020-04-04 | Discharge: 2020-04-04 | Disposition: A | Discharge status: Home / Self Care | Payer: BLUE CROSS/BLUE SHIELD | Attending: Emergency Medicine | Admitting: Emergency Medicine

## 2020-04-04 NOTE — ED Notes (Signed)
No answer when called several times from lobby 

## 2020-04-05 ENCOUNTER — Telehealth: Payer: Self-pay | Admitting: Emergency Medicine

## 2020-04-05 NOTE — Telephone Encounter (Signed)
Called patient due to lwot to inquire about condition and follow up plans. No answer and voicemail box is full 

## 2020-04-08 ENCOUNTER — Emergency Department
Admission: EM | Admit: 2020-04-08 | Discharge: 2020-04-08 | Disposition: A | Discharge status: Home / Self Care | Payer: Self-pay | Attending: Emergency Medicine | Admitting: Emergency Medicine

## 2020-04-08 ENCOUNTER — Emergency Department: Payer: Self-pay

## 2020-04-08 ENCOUNTER — Encounter: Payer: Self-pay | Admitting: Emergency Medicine

## 2020-04-08 ENCOUNTER — Other Ambulatory Visit: Payer: Self-pay

## 2020-04-08 DIAGNOSIS — F17210 Nicotine dependence, cigarettes, uncomplicated: Secondary | ICD-10-CM | POA: Insufficient documentation

## 2020-04-08 DIAGNOSIS — Y998 Other external cause status: Secondary | ICD-10-CM | POA: Insufficient documentation

## 2020-04-08 DIAGNOSIS — Y9389 Activity, other specified: Secondary | ICD-10-CM | POA: Insufficient documentation

## 2020-04-08 DIAGNOSIS — Y9289 Other specified places as the place of occurrence of the external cause: Secondary | ICD-10-CM | POA: Insufficient documentation

## 2020-04-08 DIAGNOSIS — M79601 Pain in right arm: Secondary | ICD-10-CM | POA: Insufficient documentation

## 2020-04-08 DIAGNOSIS — Z79899 Other long term (current) drug therapy: Secondary | ICD-10-CM | POA: Insufficient documentation

## 2020-04-08 DIAGNOSIS — I10 Essential (primary) hypertension: Secondary | ICD-10-CM | POA: Insufficient documentation

## 2020-04-08 DIAGNOSIS — Z7984 Long term (current) use of oral hypoglycemic drugs: Secondary | ICD-10-CM | POA: Insufficient documentation

## 2020-04-08 LAB — URINALYSIS, COMPLETE (UACMP) WITH MICROSCOPIC
Bilirubin Urine: NEGATIVE
Glucose, UA: NEGATIVE mg/dL
Hgb urine dipstick: NEGATIVE
Ketones, ur: NEGATIVE mg/dL
Leukocytes,Ua: NEGATIVE
Nitrite: NEGATIVE
Protein, ur: NEGATIVE mg/dL
Specific Gravity, Urine: 1.009 (ref 1.005–1.030)
Squamous Epithelial / HPF: NONE SEEN (ref 0–5)
pH: 8 (ref 5.0–8.0)
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
## 2020-04-08 NOTE — ED Notes (Signed)
Pt asleep upon this RN's entrance to room.
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
## 2020-04-08 NOTE — ED Notes (Signed)
Pt also reports having strong odor to urine. Pt gave UA, urine appears cloudy. Will send sample to lab.

## 2020-04-08 NOTE — Discharge Instructions (Addendum)
Apply ice to your arm as needed for discomfort.  Also a prescription for naproxen was sent to your pharmacy.  This is to be taken twice a day with food.  Follow-up with your primary care provider if any continued problems.  It may take 1 to 2 weeks before your arm feels completely back to normal.  No fractures were seen on your x-ray.
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
## 2020-04-15 ENCOUNTER — Other Ambulatory Visit: Payer: Self-pay

## 2020-04-15 ENCOUNTER — Emergency Department
Admission: EM | Admit: 2020-04-15 | Discharge: 2020-04-15 | Disposition: A | Discharge status: Home / Self Care | Payer: Self-pay | Attending: Emergency Medicine | Admitting: Emergency Medicine

## 2020-04-15 DIAGNOSIS — I10 Essential (primary) hypertension: Secondary | ICD-10-CM | POA: Insufficient documentation

## 2020-04-15 DIAGNOSIS — R202 Paresthesia of skin: Secondary | ICD-10-CM | POA: Insufficient documentation

## 2020-04-15 DIAGNOSIS — Z79899 Other long term (current) drug therapy: Secondary | ICD-10-CM | POA: Insufficient documentation

## 2020-04-15 DIAGNOSIS — F17210 Nicotine dependence, cigarettes, uncomplicated: Secondary | ICD-10-CM | POA: Insufficient documentation

## 2020-04-15 MED ORDER — MELOXICAM 7.5 MG PO TABS
15.0000 mg | ORAL_TABLET | Freq: Once | ORAL | Status: AC
  Administered 2020-04-15: 15 mg via ORAL
  Filled 2020-04-15: qty 2

## 2020-04-15 MED ORDER — MELOXICAM 15 MG PO TABS
15.0000 mg | ORAL_TABLET | Freq: Every day | ORAL | 0 refills | Status: DC
Start: 2020-04-15 — End: 2023-11-17

## 2020-04-15 MED ORDER — PREDNISONE 20 MG PO TABS
60.0000 mg | ORAL_TABLET | Freq: Once | ORAL | Status: AC
  Administered 2020-04-15: 60 mg via ORAL
  Filled 2020-04-15: qty 3

## 2020-04-15 MED ORDER — PREDNISONE 50 MG PO TABS
50.0000 mg | ORAL_TABLET | Freq: Every day | ORAL | 0 refills | Status: DC
Start: 2020-04-15 — End: 2023-11-15
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
## 2020-08-14 DIAGNOSIS — K625 Hemorrhage of anus and rectum: Secondary | ICD-10-CM | POA: Insufficient documentation

## 2020-08-14 DIAGNOSIS — Z716 Tobacco abuse counseling: Secondary | ICD-10-CM | POA: Insufficient documentation

## 2020-08-14 DIAGNOSIS — R636 Underweight: Secondary | ICD-10-CM | POA: Insufficient documentation

## 2021-02-19 ENCOUNTER — Encounter: Payer: Self-pay | Admitting: Emergency Medicine

## 2021-02-19 ENCOUNTER — Emergency Department
Admission: EM | Admit: 2021-02-19 | Discharge: 2021-02-19 | Disposition: A | Discharge status: Home / Self Care | Payer: 59 | Attending: Emergency Medicine | Admitting: Emergency Medicine

## 2021-02-19 ENCOUNTER — Emergency Department: Payer: 59

## 2021-02-19 ENCOUNTER — Other Ambulatory Visit: Payer: Self-pay

## 2021-02-19 DIAGNOSIS — F17210 Nicotine dependence, cigarettes, uncomplicated: Secondary | ICD-10-CM | POA: Insufficient documentation

## 2021-02-19 DIAGNOSIS — R55 Syncope and collapse: Secondary | ICD-10-CM | POA: Diagnosis not present

## 2021-02-19 DIAGNOSIS — R4182 Altered mental status, unspecified: Secondary | ICD-10-CM | POA: Insufficient documentation

## 2021-02-19 DIAGNOSIS — I10 Essential (primary) hypertension: Secondary | ICD-10-CM | POA: Insufficient documentation

## 2021-02-19 DIAGNOSIS — B9689 Other specified bacterial agents as the cause of diseases classified elsewhere: Secondary | ICD-10-CM | POA: Diagnosis not present

## 2021-02-19 DIAGNOSIS — R251 Tremor, unspecified: Secondary | ICD-10-CM | POA: Diagnosis not present

## 2021-02-19 DIAGNOSIS — N390 Urinary tract infection, site not specified: Secondary | ICD-10-CM | POA: Diagnosis not present

## 2021-02-19 DIAGNOSIS — E860 Dehydration: Secondary | ICD-10-CM | POA: Insufficient documentation

## 2021-02-19 DIAGNOSIS — Z79899 Other long term (current) drug therapy: Secondary | ICD-10-CM | POA: Diagnosis not present

## 2021-02-19 LAB — CBC WITH DIFFERENTIAL/PLATELET
Abs Immature Granulocytes: 0.01 10*3/uL (ref 0.00–0.07)
Basophils Absolute: 0.1 10*3/uL (ref 0.0–0.1)
Basophils Relative: 1 %
Eosinophils Absolute: 0.1 10*3/uL (ref 0.0–0.5)
Eosinophils Relative: 2 %
HCT: 37.6 % (ref 36.0–46.0)
Hemoglobin: 12.4 g/dL (ref 12.0–15.0)
Immature Granulocytes: 0 %
Lymphocytes Relative: 43 %
Lymphs Abs: 2.5 10*3/uL (ref 0.7–4.0)
MCH: 29 pg (ref 26.0–34.0)
MCHC: 33 g/dL (ref 30.0–36.0)
MCV: 88.1 fL (ref 80.0–100.0)
Monocytes Absolute: 0.4 10*3/uL (ref 0.1–1.0)
Monocytes Relative: 7 %
Neutro Abs: 2.7 10*3/uL (ref 1.7–7.7)
Neutrophils Relative %: 47 %
Platelets: 440 10*3/uL — ABNORMAL HIGH (ref 150–400)
RBC: 4.27 MIL/uL (ref 3.87–5.11)
RDW: 13.1 % (ref 11.5–15.5)
WBC: 5.8 10*3/uL (ref 4.0–10.5)
nRBC: 0 % (ref 0.0–0.2)

## 2021-02-19 LAB — URINALYSIS, COMPLETE (UACMP) WITH MICROSCOPIC
Bilirubin Urine: NEGATIVE
Glucose, UA: NEGATIVE mg/dL
Ketones, ur: NEGATIVE mg/dL
Nitrite: POSITIVE — AB
Protein, ur: NEGATIVE mg/dL
Specific Gravity, Urine: 1.008 (ref 1.005–1.030)
pH: 7 (ref 5.0–8.0)

## 2021-02-19 LAB — COMPREHENSIVE METABOLIC PANEL
ALT: 15 U/L (ref 0–44)
AST: 19 U/L (ref 15–41)
Albumin: 3.8 g/dL (ref 3.5–5.0)
Alkaline Phosphatase: 98 U/L (ref 38–126)
Anion gap: 8 (ref 5–15)
BUN: 25 mg/dL — ABNORMAL HIGH (ref 6–20)
CO2: 28 mmol/L (ref 22–32)
Calcium: 9.2 mg/dL (ref 8.9–10.3)
Chloride: 106 mmol/L (ref 98–111)
Creatinine, Ser: 0.81 mg/dL (ref 0.44–1.00)
GFR, Estimated: >60 mL/min (ref >60)
Glucose, Bld: 99 mg/dL (ref 70–99)
Potassium: 3.6 mmol/L (ref 3.5–5.1)
Sodium: 142 mmol/L (ref 135–145)
Total Bilirubin: 0.7 mg/dL (ref 0.3–1.2)
Total Protein: 7.5 g/dL (ref 6.5–8.1)

## 2021-02-19 LAB — URINE DRUG SCREEN, QUALITATIVE (ARMC ONLY)
Amphetamines, Ur Screen: NOT DETECTED
Barbiturates, Ur Screen: NOT DETECTED
Benzodiazepine, Ur Scrn: NOT DETECTED
Cannabinoid 50 Ng, Ur ~~LOC~~: POSITIVE — AB
Cocaine Metabolite,Ur ~~LOC~~: POSITIVE — AB
MDMA (Ecstasy)Ur Screen: NOT DETECTED
Methadone Scn, Ur: NOT DETECTED
Opiate, Ur Screen: NOT DETECTED
Phencyclidine (PCP) Ur S: NOT DETECTED
Tricyclic, Ur Screen: NOT DETECTED

## 2021-02-19 LAB — TROPONIN I (HIGH SENSITIVITY): Troponin I (High Sensitivity): 4 ng/L (ref <18)

## 2021-02-19 LAB — LACTIC ACID, PLASMA: Lactic Acid, Venous: 1 mmol/L (ref 0.5–1.9)

## 2021-02-19 MED ORDER — LACTATED RINGERS IV BOLUS
1000.0000 mL | Freq: Once | INTRAVENOUS | Status: AC
  Administered 2021-02-19: 1000 mL via INTRAVENOUS

## 2021-02-19 MED ORDER — CEFDINIR 300 MG PO CAPS: 300.0000 mg | ORAL_CAPSULE | Freq: Two times a day (BID) | ORAL | 0 refills | Status: AC

## 2021-02-19 NOTE — ED Notes (Signed)
Pt given orange juice.

## 2021-02-19 NOTE — ED Triage Notes (Addendum)
Pt ems from home for possible seizure like activity. Pt denies hx seizures. Pt sleepy but a/o. Per ems cbg 96

## 2021-02-19 NOTE — ED Provider Notes (Signed)
[facility] Emergency Department Provider Note   ____________________________________________   Event Date/Time   First MD Initiated Contact with Patient 02/19/21 [phone]     (approximate)  I have reviewed the triage vital signs and the nursing notes.   HISTORY  Chief Complaint Seizures    HPI Penny Gill is a 60 y.o. female who presents via EMS after an episode of altered mental status  LOCATION: General DURATION: 2-3 minutes TIMING: Resolving since onset SEVERITY: Severe QUALITY: Loss of consciousness CONTEXT: Patient states that she was sitting comfortably in a chair outside of her house smoking a cigarette when she felt lightheaded and lost consciousness.  Bystanders noted some shaking activity and fire department noted patient to be still confused and barely answer questions appropriately upon their arrival.  EMS state questions were answered appropriately when they arrived MODIFYING FACTORS: Patient states a change to her pain medicine regimen that has been causing her to be more lightheaded soon after she takes them ASSOCIATED SYMPTOMS: Continued lightheadedness   Per medical record review patient does have history of chronic back pain with chronic pain syndrome and is on multiple sedative medications including baclofen, nortriptyline, mirtazapine, and trazodone          Past Medical History:  Diagnosis Date   Depression    Hyperlipidemia    Hypertension    Kidney stone    Sickle cell trait (HCC)     Patient Active Problem List   Diagnosis Date Noted   Dental caries 01/11/2020   Tobacco dependence 01/11/2020   Varicose vein 01/11/2020   Chronic pain syndrome 01/11/2020   Pharmacologic therapy 01/11/2020   Disorder of skeletal system 01/11/2020   Problems influencing health status 01/11/2020   Chronic lower extremity pain (1ry area of Pain) (Right) 01/11/2020   Chronic low back pain (2ry area of Pain) (Bilateral) (R>L) w/o  sciatica 01/11/2020   Chronic hip pain (3ry area of Pain) (Bilateral) (R>L) 01/11/2020   Chronic knee pain (4th area of Pain) (Bilateral) (R>L) 01/11/2020   Lumbar facet syndrome (Bilateral) (R>L) 01/11/2020   Nephrolithiasis 12/30/2019   Sepsis secondary to UTI (HCC) 11/27/2019   Sepsis (HCC) 11/27/2019   Acute pyelonephritis 11/27/2019   Hypokalemia    Trochanteric bursitis of hip (Right) 06/27/2019   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 Authorizing Provider  cefdinir (OMNICEF) 300 MG capsule Take 1 capsule (300 mg total) by mouth 2 (two) times daily for 5 days. 02/19/21 02/24/21 Yes Merwyn Katos, MD  amLODipine (NORVASC) 5 MG tablet Take 5 mg by mouth daily. 10/02/17   [provider]  atorvastatin (LIPITOR) 20 MG tablet Take 20 mg by mouth at bedtime. 10/02/17   [provider]  baclofen (LIORESAL) 10 MG tablet Take by mouth. 12/25/19   [provider]  clobetasol (TEMOVATE) 0.05 % external solution clobetasol 0.05 % scalp solution    [provider]  ergocalciferol (VITAMIN D2) 1.25 MG (50000 UT) capsule ergocalciferol (vitamin D2) 1,250 mcg (50,000 unit) capsule 12/25/19   [provider]  Fluocinolone Acetonide Body 0.01 % OIL fluocinolone 0.01 % scalp oil and shower cap  APPLY ON SCALP ONCE DAILY 12/27/19   [provider]  gabapentin (NEURONTIN) 300 MG capsule Take 300 mg by mouth 2 (two) times daily.    [provider]  meloxicam (MOBIC) 15 MG tablet Take 1 tablet (15 mg total) by mouth daily. 04/15/20   Cuthriell, Delorise Royals, PA-C  metFORMIN (GLUCOPHAGE) 500 MG tablet Take 1 tablet (500 mg  total) by mouth 2 (two) times daily with a meal. 02/06/20 02/05/21  Don Perking, [place], MD  mirtazapine (REMERON) 30 MG tablet mirtazapine 30 mg tablet    [provider]  nortriptyline (PAMELOR) 25 MG capsule nortriptyline 25 mg capsule    [provider]  omeprazole (PRILOSEC) 20 MG capsule omeprazole 20 mg capsule,delayed release  TAKE 1 CAPSULE BY MOUTH ONCE DAILY. NEEDS OFFICE VISIT FOR FURTHER REFILLS.    [provider]  predniSONE (DELTASONE) 50 MG tablet Take 1 tablet (50 mg total) by mouth daily with breakfast. 04/15/20   Cuthriell, Delorise Royals, PA-C  Prenatal Vit-Fe Fumarate-FA (PREPLUS) 27-1 MG TABS Take 1 tablet by mouth daily. 09/30/17   [provider]  traZODone (DESYREL) 150 MG tablet Take 150 mg by mouth at bedtime. 12/25/19   [provider]    Allergies Patient has no known allergies.  Family History  Problem Relation Age of Onset   Breast cancer Maternal Aunt 53    Social History Social History   Tobacco Use   Smoking status: Every Day    Packs/day: 0.50    Pack years: 0.00    Types: Cigarettes   Smokeless tobacco: Never  Vaping Use   Vaping Use: Never used  Substance Use Topics   Alcohol use: Yes    Comment: rare   Drug use: Yes    Types: Marijuana    Comment: History of cocaine abuse    Review of Systems Constitutional: No fever/chills Eyes: No visual changes. ENT: No sore throat. Cardiovascular: Denies chest pain. Respiratory: Denies shortness of breath. Gastrointestinal: No abdominal pain.  No nausea, no vomiting.  No diarrhea. Genitourinary: Negative for dysuria. Musculoskeletal: Negative for acute arthralgias Skin: Negative for rash. Neurological: Negative for headaches, weakness/numbness/paresthesias in any extremity Psychiatric: Negative for suicidal ideation/homicidal ideation   ____________________________________________   PHYSICAL EXAM:  VITAL SIGNS: ED Triage Vitals  Enc Vitals Group      BP      Pulse      Resp      Temp      Temp src      SpO2      Weight      Height      Head Circumference      Peak Flow      Pain Score      Pain Loc      Pain Edu?      Excl. in GC?    Constitutional: Alert and oriented. Well appearing thin African-American elderly female in no acute distress. Eyes: Conjunctivae are normal. PERRL. Head: Atraumatic. Nose: No congestion/rhinnorhea. Mouth/Throat: Mucous membranes are moist. Neck: No stridor Cardiovascular: Grossly normal heart sounds.  Good peripheral circulation. Respiratory: Normal respiratory effort.  No retractions. Gastrointestinal: Soft and nontender. No distention. Musculoskeletal: No obvious deformities Neurologic:  Normal speech and language. No gross focal neurologic deficits are appreciated. Skin:  Skin is warm and dry. No rash noted. Psychiatric: Mood and affect are normal. Speech and behavior are normal.  ____________________________________________   LABS (all labs ordered are listed, but only abnormal results are displayed)  Labs Reviewed  COMPREHENSIVE METABOLIC PANEL - Abnormal; Notable for the following components:      Result Value   BUN 25 (*)    All other components within normal limits  CBC WITH DIFFERENTIAL/PLATELET - Abnormal; Notable for the following components:   Platelets 440 (*)    All other components within normal limits  URINALYSIS, COMPLETE (UACMP) WITH MICROSCOPIC - Abnormal; Notable for the following components:   Color, Urine YELLOW (*)    APPearance HAZY (*)    Hgb urine dipstick MODERATE (*)    Nitrite POSITIVE (*)    Leukocytes,Ua MODERATE (*)    Bacteria, UA FEW (*)    All other components within normal limits  URINE DRUG SCREEN, QUALITATIVE ([facility] ONLY) - Abnormal; Notable for the following components:   Cocaine Metabolite,Ur [place] POSITIVE (*)    Cannabinoid 50 Ng, Ur [place] POSITIVE (*)    All other components within normal limits  LACTIC ACID, PLASMA  TROPONIN I (HIGH  SENSITIVITY)   ____________________________________________  EKG  ED ECG REPORT I, Merwyn Katos, the attending physician, personally viewed and interpreted this ECG.  Date: 02/19/2021 EKG Time: 0739 Rate: 78 Rhythm: normal sinus rhythm QRS Axis: normal Intervals: normal ST/T Wave abnormalities: normal Narrative Interpretation: no evidence of acute ischemia.  PVCs  ____________________________________________  RADIOLOGY  ED MD interpretation: CT of the head without contrast shows no evidence of acute abnormalities including no intracerebral hemorrhage, obvious masses, or significant edema  Official radiology report(s): CT Head Wo Contrast  Result Date: 02/19/2021 CLINICAL DATA:  Question seizure activity.  Somnolent. EXAM: CT HEAD WITHOUT CONTRAST TECHNIQUE: Contiguous axial images were obtained from the base of the skull through the vertex without intravenous contrast. COMPARISON:  None. FINDINGS: Brain: The brain shows a normal appearance without evidence of malformation, atrophy, old or acute small or large vessel infarction, mass lesion, hemorrhage, hydrocephalus or extra-axial collection. Vascular: There is atherosclerotic calcification of the major vessels at the base of the brain. Skull: Normal.  No traumatic finding.  No focal bone lesion. Sinuses/Orbits: Sinuses are clear. Orbits appear normal. Mastoids are clear. Other: None significant IMPRESSION: Normal examination for age.  No abnormality seen to explain seizure. Electronically Signed   By: Paulina Fusi M.D.   On: 02/19/2021 07:57    ____________________________________________   PROCEDURES  Procedure(s) performed (including Critical Care):  .1-3 Lead EKG Interpretation  Date/Time: 02/19/2021 10:42 AM Performed by: Merwyn Katos, MD Authorized by: Merwyn Katos, MD     Interpretation: normal     ECG rate:  74   ECG rate assessment: normal     Rhythm: sinus rhythm     Ectopy: none     Conduction: normal      ____________________________________________   INITIAL IMPRESSION / ASSESSMENT AND PLAN / ED COURSE  As part of my medical decision making, I reviewed the following data within the electronic medical record, if available:  Nursing notes reviewed and incorporated, Labs reviewed, EKG interpreted, Old chart reviewed, Radiograph reviewed and Notes from prior ED visits reviewed and incorporated      Patient presents with complaints of syncope/presyncope ED Workup:  CBC, BMP, Troponin, BNP, ECG, CXR Differential diagnosis includes HF, ICH, seizure, stroke, HOCM, ACS, aortic dissection, malignant arrhythmia, or GI bleed. Findings: No evidence of acute laboratory abnormalities.  Troponin negative x1 EKG: No e/o STEMI. No evidence of Brugadas sign, delta wave, epsilon wave, significantly prolonged QTc, or malignant arrhythmia. Patient does have evidence of urinary tract infection on UA Patient encouraged  to follow-up with her primary care physician for possible change in her pain regimen Disposition: Discharge. Patient is at baseline at this time. Return precautions expressed and understood in person. Advised follow up with primary care provider or clinic physician in next 24 hours.      ____________________________________________   FINAL CLINICAL IMPRESSION(S) / ED DIAGNOSES  Final diagnoses:  Syncope and collapse  Lower urinary tract infectious disease  Dehydration     ED Discharge Orders          Ordered    cefdinir (OMNICEF) 300 MG capsule  2 times daily        02/19/21 1038             Note:  This document was prepared using Dragon voice recognition software and may include unintentional dictation errors.    Merwyn KatosBradler, Rasul Decola K, MD 02/19/21 [phone]

## 2021-04-08 ENCOUNTER — Other Ambulatory Visit: Payer: Self-pay

## 2021-04-08 ENCOUNTER — Encounter: Payer: Self-pay | Admitting: Emergency Medicine

## 2021-04-08 ENCOUNTER — Emergency Department
Admission: EM | Admit: 2021-04-08 | Discharge: 2021-04-08 | Disposition: A | Discharge status: Home / Self Care | Payer: 59 | Attending: Emergency Medicine | Admitting: Emergency Medicine

## 2021-04-08 DIAGNOSIS — Z79899 Other long term (current) drug therapy: Secondary | ICD-10-CM | POA: Insufficient documentation

## 2021-04-08 DIAGNOSIS — M79621 Pain in right upper arm: Secondary | ICD-10-CM | POA: Diagnosis present

## 2021-04-08 DIAGNOSIS — L03111 Cellulitis of right axilla: Secondary | ICD-10-CM | POA: Insufficient documentation

## 2021-04-08 DIAGNOSIS — F17210 Nicotine dependence, cigarettes, uncomplicated: Secondary | ICD-10-CM | POA: Diagnosis not present

## 2021-04-08 DIAGNOSIS — I10 Essential (primary) hypertension: Secondary | ICD-10-CM | POA: Diagnosis not present

## 2021-04-08 MED ORDER — SULFAMETHOXAZOLE-TRIMETHOPRIM 800-160 MG PO TABS
1.0000 | ORAL_TABLET | Freq: Once | ORAL | Status: AC
  Administered 2021-04-08: 1 via ORAL
  Filled 2021-04-08: qty 1

## 2021-04-08 MED ORDER — HYDROCODONE-ACETAMINOPHEN 5-325 MG PO TABS
1.0000 | ORAL_TABLET | Freq: Once | ORAL | Status: AC
  Administered 2021-04-08: 1 via ORAL
  Filled 2021-04-08: qty 1

## 2021-04-08 MED ORDER — SULFAMETHOXAZOLE-TRIMETHOPRIM 800-160 MG PO TABS: 1.0000 | ORAL_TABLET | Freq: Two times a day (BID) | ORAL | 0 refills | Status: DC

## 2021-04-08 MED ORDER — ACETAMINOPHEN-CODEINE #3 300-30 MG PO TABS: 1.0000 | ORAL_TABLET | ORAL | 0 refills | Status: DC | PRN

## 2021-04-08 NOTE — ED Triage Notes (Signed)
C/O boils -- 5 to right axilla and 1 to abdomen.  Onset of symptoms 3 days ago.

## 2021-04-08 NOTE — ED Provider Notes (Signed)
[facility] Emergency Department Provider Note  ____________________________________________  Time seen: Approximately 6:47 PM  I have reviewed the triage vital signs and the nursing notes.   HISTORY  Chief Complaint Abscess    HPI Penny Gill is a 60 y.o. female who presents the emergency department complaining of painful lesions in the right armpit.  Patient has multiple lesions that are developed that are erythematous, edematous and painful.  No drainage.  No history of recurrent skin infections.  No systemic complaints at this time.       Past Medical History:  Diagnosis Date   Depression    Hyperlipidemia    Hypertension    Kidney stone    Sickle cell trait [facility])     Patient Active Problem List   Diagnosis Date Noted   Dental caries 01/11/2020   Tobacco dependence 01/11/2020   Varicose vein 01/11/2020   Chronic pain syndrome 01/11/2020   Pharmacologic therapy 01/11/2020   Disorder of skeletal system 01/11/2020   Problems influencing health status 01/11/2020   Chronic lower extremity pain (1ry area of Pain) (Right) 01/11/2020   Chronic low back pain (2ry area of Pain) (Bilateral) (R>L) w/o sciatica 01/11/2020   Chronic hip pain (3ry area of Pain) (Bilateral) (R>L) 01/11/2020   Chronic knee pain (4th area of Pain) (Bilateral) (R>L) 01/11/2020   Lumbar facet syndrome (Bilateral) (R>L) 01/11/2020   Nephrolithiasis 12/30/2019   Sepsis secondary to UTI (HCC) 11/27/2019   Sepsis (HCC) 11/27/2019   Acute pyelonephritis 11/27/2019   Hypokalemia    Trochanteric bursitis of hip (Right) 06/27/2019   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 Authorizing Provider  acetaminophen-codeine (TYLENOL #3) 300-30 MG tablet Take 1 tablet by mouth every 4 (four) hours as needed for severe pain. 04/08/21  Yes Lanna Labella, Delorise Royals, PA-C  sulfamethoxazole-trimethoprim (BACTRIM DS) 800-160 MG tablet Take 1 tablet by mouth 2 (two) times daily. 04/08/21  Yes Besnik Febus, Delorise Royals, PA-C  amLODipine (NORVASC) 5 MG tablet Take 5 mg by mouth daily. 10/02/17   [provider]  atorvastatin (LIPITOR) 20 MG tablet Take 20 mg by mouth at bedtime. 10/02/17   [provider]  baclofen (LIORESAL) 10 MG tablet Take by mouth. 12/25/19   [provider]  clobetasol (TEMOVATE) 0.05 % external solution clobetasol 0.05 % scalp solution    [provider]  ergocalciferol (VITAMIN D2) 1.25 MG (50000 UT) capsule ergocalciferol (vitamin D2) 1,250 mcg (50,000 unit) capsule 12/25/19   [provider]  Fluocinolone Acetonide Body 0.01 % OIL fluocinolone 0.01 % scalp oil and shower cap  APPLY ON SCALP ONCE DAILY 12/27/19   [provider]  gabapentin (NEURONTIN) 300 MG capsule Take 300 mg by mouth 2 (two) times daily.    [provider]  meloxicam (MOBIC) 15 MG tablet Take 1 tablet (15 mg total) by mouth daily. 04/15/20   Kela Baccari, Delorise Royals, PA-C  metFORMIN (GLUCOPHAGE) 500 MG tablet Take 1 tablet (500 mg total) by mouth 2 (two) times daily with a meal. 02/06/20 02/05/21  Don Perking, [place], MD  mirtazapine (REMERON) 30 MG tablet mirtazapine 30 mg tablet    [provider]  nortriptyline (PAMELOR) 25 MG capsule nortriptyline 25 mg capsule    [provider]  omeprazole (PRILOSEC) 20 MG capsule omeprazole 20 mg capsule,delayed release  TAKE 1 CAPSULE BY MOUTH ONCE DAILY. NEEDS OFFICE VISIT FOR FURTHER REFILLS.    [provider]  predniSONE (DELTASONE) 50 MG tablet Take 1 tablet (50 mg total) by mouth daily with breakfast. 04/15/20   Kebrina Friend, Delorise Royals, PA-C   Prenatal Vit-Fe Fumarate-FA (PREPLUS) 27-1 MG TABS Take 1 tablet by mouth daily. 09/30/17   [provider]  traZODone (DESYREL) 150 MG tablet Take 150 mg by mouth at bedtime. 12/25/19   [provider]    Allergies Patient has no known allergies.  Family History  Problem Relation Age of Onset   Breast cancer Maternal Aunt 79    Social History Social History   Tobacco Use   Smoking status: Every Day    Packs/day: 0.50    Types: Cigarettes   Smokeless tobacco: Never  Vaping Use   Vaping Use: Never used  Substance Use Topics   Alcohol use: Yes    Comment: rare   Drug use: Yes    Types: Marijuana    Comment: History of cocaine abuse     Review of Systems  Constitutional: No fever/chills Eyes: No visual changes. No discharge ENT: No upper respiratory complaints. Cardiovascular: no chest pain. Respiratory: no cough. No SOB. Gastrointestinal: No abdominal pain.  No nausea, no vomiting.  No diarrhea.  No constipation. Musculoskeletal: Negative for musculoskeletal pain. Skin: Multiple skin lesions to the right axilla Neurological: Negative for headaches, focal weakness or numbness.  10 System ROS otherwise negative.  ____________________________________________   PHYSICAL EXAM:  VITAL SIGNS: ED Triage Vitals  Enc Vitals Group     BP 04/08/21 1831 (!) 150/97     Pulse Rate 04/08/21 1831 94     Resp 04/08/21 1831 18     Temp 04/08/21 1831 98 F (36.7 C)     Temp Source 04/08/21 1831 Oral     SpO2 04/08/21 1831 100 %     Weight 04/08/21 1727 86 lb 10.3 oz (39.3 kg)     Height 04/08/21 1727 5' (1.524 m)     Head Circumference --      Peak Flow --      Pain Score 04/08/21 1727 7     Pain Loc --      Pain Edu? --      Excl. in GC? --      Constitutional: Alert and oriented. Well appearing and in no acute distress. Eyes: Conjunctivae are normal. PERRL. EOMI. Head: Atraumatic. ENT:      Ears:       Nose: No congestion/rhinnorhea.       Mouth/Throat: Mucous membranes are moist.  Neck: No stridor.    Cardiovascular: Normal rate, regular rhythm. Normal S1 and S2.  Good peripheral circulation. Respiratory: Normal respiratory effort without tachypnea or retractions. Lungs CTAB. Good air entry to the bases with no decreased or absent breath sounds. Musculoskeletal: Full range of motion to all extremities. No gross deformities appreciated. Neurologic:  Normal speech and language. No gross focal neurologic deficits are appreciated.  Skin:  Skin is warm, dry and intact. No rash noted.  Visualization of the right axilla reveals erythematous lesions to the axilla itself.  Areas are firm but there is no fluctuance.  No drainage identified.  No gross surrounding cellulitis extending outside of these specific lesions in the axilla. Psychiatric: Mood  and affect are normal. Speech and behavior are normal. Patient exhibits appropriate insight and judgement.   ____________________________________________   LABS (all labs ordered are listed, but only abnormal results are displayed)  Labs Reviewed - No data to display ____________________________________________  EKG   ____________________________________________  RADIOLOGY   No results found.  ____________________________________________    PROCEDURES  Procedure(s) performed:    Procedures    Medications  sulfamethoxazole-trimethoprim (BACTRIM DS) 800-160 MG per tablet 1 tablet (has no administration in time range)     ____________________________________________   INITIAL IMPRESSION / ASSESSMENT AND PLAN / ED COURSE  Pertinent labs & imaging results that were available during my care of the patient were reviewed by me and considered in my medical decision making (see chart for details).  Review of the [place] CSRS was performed in accordance of the [facility] prior to dispensing any controlled drugs.           Patient's diagnosis is consistent with cellulitis of the  right axilla.  Patient presented to the emergency department complaining of painful lesions to the right axilla.  No history of recurrent skin infections.  There appears to be areas of cellulitis without evidence of abscess.  No incision and drainage performed.  Patient was started on antibiotics.  Follow-up primary care as needed..  Patient is given ED precautions to return to the ED for any worsening or new symptoms.     ____________________________________________  FINAL CLINICAL IMPRESSION(S) / ED DIAGNOSES  Final diagnoses:  Cellulitis of right axilla      NEW MEDICATIONS STARTED DURING THIS VISIT:  ED Discharge Orders          Ordered    sulfamethoxazole-trimethoprim (BACTRIM DS) 800-160 MG tablet  2 times daily        04/08/21 2026    acetaminophen-codeine (TYLENOL #3) 300-30 MG tablet  Every 4 hours PRN        04/08/21 2026                This chart was dictated using voice recognition software/Dragon. Despite best efforts to proofread, errors can occur which can change the meaning. Any change was purely unintentional.    Racheal Patches, PA-C 04/08/21 2026    Sharman Cheek, MD 04/08/21 2321
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
## 2021-07-02 ENCOUNTER — Emergency Department
Admission: EM | Admit: 2021-07-02 | Discharge: 2021-07-02 | Disposition: A | Discharge status: Home / Self Care | Payer: 59 | Attending: Emergency Medicine | Admitting: Emergency Medicine

## 2021-07-02 ENCOUNTER — Other Ambulatory Visit: Payer: Self-pay

## 2021-07-02 ENCOUNTER — Encounter: Payer: Self-pay | Admitting: Emergency Medicine

## 2021-07-02 DIAGNOSIS — I10 Essential (primary) hypertension: Secondary | ICD-10-CM | POA: Insufficient documentation

## 2021-07-02 DIAGNOSIS — Z79899 Other long term (current) drug therapy: Secondary | ICD-10-CM | POA: Insufficient documentation

## 2021-07-02 DIAGNOSIS — F1410 Cocaine abuse, uncomplicated: Secondary | ICD-10-CM | POA: Insufficient documentation

## 2021-07-02 DIAGNOSIS — K625 Hemorrhage of anus and rectum: Secondary | ICD-10-CM | POA: Insufficient documentation

## 2021-07-02 DIAGNOSIS — Z7984 Long term (current) use of oral hypoglycemic drugs: Secondary | ICD-10-CM | POA: Insufficient documentation

## 2021-07-02 DIAGNOSIS — F17210 Nicotine dependence, cigarettes, uncomplicated: Secondary | ICD-10-CM | POA: Insufficient documentation

## 2021-07-02 DIAGNOSIS — K649 Unspecified hemorrhoids: Secondary | ICD-10-CM | POA: Insufficient documentation

## 2021-07-02 LAB — CBC WITH DIFFERENTIAL/PLATELET
Abs Immature Granulocytes: 0.02 10*3/uL (ref 0.00–0.07)
Basophils Absolute: 0 10*3/uL (ref 0.0–0.1)
Basophils Relative: 1 %
Eosinophils Absolute: 0.1 10*3/uL (ref 0.0–0.5)
Eosinophils Relative: 1 %
HCT: 38.7 % (ref 36.0–46.0)
Hemoglobin: 13 g/dL (ref 12.0–15.0)
Immature Granulocytes: 0 %
Lymphocytes Relative: 28 %
Lymphs Abs: 2 10*3/uL (ref 0.7–4.0)
MCH: 29.8 pg (ref 26.0–34.0)
MCHC: 33.6 g/dL (ref 30.0–36.0)
MCV: 88.8 fL (ref 80.0–100.0)
Monocytes Absolute: 0.4 10*3/uL (ref 0.1–1.0)
Monocytes Relative: 6 %
Neutro Abs: 4.5 10*3/uL (ref 1.7–7.7)
Neutrophils Relative %: 64 %
Platelets: 342 10*3/uL (ref 150–400)
RBC: 4.36 MIL/uL (ref 3.87–5.11)
RDW: 13 % (ref 11.5–15.5)
WBC: 7 10*3/uL (ref 4.0–10.5)
nRBC: 0 % (ref 0.0–0.2)

## 2021-07-02 LAB — COMPREHENSIVE METABOLIC PANEL
ALT: 21 U/L (ref 0–44)
AST: 19 U/L (ref 15–41)
Albumin: 4.1 g/dL (ref 3.5–5.0)
Alkaline Phosphatase: 89 U/L (ref 38–126)
Anion gap: 8 (ref 5–15)
BUN: 11 mg/dL (ref 6–20)
CO2: 28 mmol/L (ref 22–32)
Calcium: 8.5 mg/dL — ABNORMAL LOW (ref 8.9–10.3)
Chloride: 104 mmol/L (ref 98–111)
Creatinine, Ser: 0.57 mg/dL (ref 0.44–1.00)
GFR, Estimated: >60 mL/min (ref >60)
Glucose, Bld: 101 mg/dL — ABNORMAL HIGH (ref 70–99)
Potassium: 3.3 mmol/L — ABNORMAL LOW (ref 3.5–5.1)
Sodium: 140 mmol/L (ref 135–145)
Total Bilirubin: 1.1 mg/dL (ref 0.3–1.2)
Total Protein: 7.9 g/dL (ref 6.5–8.1)

## 2021-07-02 LAB — URINALYSIS, ROUTINE W REFLEX MICROSCOPIC
Bilirubin Urine: NEGATIVE
Glucose, UA: NEGATIVE mg/dL
Hgb urine dipstick: NEGATIVE
Ketones, ur: NEGATIVE mg/dL
Nitrite: POSITIVE — AB
Protein, ur: NEGATIVE mg/dL
Specific Gravity, Urine: 1.009 (ref 1.005–1.030)
pH: 7 (ref 5.0–8.0)

## 2021-07-02 LAB — LIPASE, BLOOD: Lipase: 26 U/L (ref 11–51)

## 2021-07-02 LAB — TYPE AND SCREEN
ABO/RH(D): B POS
Antibody Screen: NEGATIVE

## 2021-07-02 MED ORDER — HYDROCORTISONE ACETATE 25 MG RE SUPP: 25.0000 mg | Freq: Two times a day (BID) | RECTAL | 1 refills | Status: AC

## 2021-07-02 MED ORDER — POTASSIUM CHLORIDE CRYS ER 20 MEQ PO TBCR
40.0000 meq | EXTENDED_RELEASE_TABLET | Freq: Once | ORAL | Status: AC
  Administered 2021-07-02: 40 meq via ORAL
  Filled 2021-07-02: qty 2

## 2021-07-02 NOTE — ED Triage Notes (Signed)
Patient ambulatory to triage with steady gait, without difficulty or distress noted, brought in by EMS; pt reports rectal bleeding x 3 days; denies any pain; st "it happens when I smoke crack"

## 2021-07-02 NOTE — ED Provider Notes (Signed)
[facility] Emergency Department Provider Note   ____________________________________________   Event Date/Time   First MD Initiated Contact with Patient 07/02/21 [phone]     (approximate)  I have reviewed the triage vital signs and the nursing notes.   HISTORY  Chief Complaint Rectal Bleeding    HPI Penny Gill is a 60 y.o. female with past medical history of hypertension, hyperlipidemia, diabetes, chronic pain syndrome who presents to the ED complaining of rectal bleeding.  Patient reports that she has had 3 days of bleeding from her rectum, has noticed bright red blood mixed in with her stool as well as blood when she wipes.  This is been associated with some rectal pain, but she denies any abdominal pain, nausea, vomiting, or fever.  She reports similar symptoms in the past, when she had a colonoscopy and was told bleeding was due to hemorrhoids.  She has not been taking anything for her symptoms at home.  She does admit to 3 days of crack cocaine use, during which time she has not had much to eat or drink.  She denies any fevers, cough, chest pain, or shortness of breath.  She does not take any blood thinners.        Past Medical History:  Diagnosis Date   Depression    Hyperlipidemia    Hypertension    Kidney stone    Sickle cell trait [facility])     Patient Active Problem List   Diagnosis Date Noted   Dental caries 01/11/2020   Tobacco dependence 01/11/2020   Varicose vein 01/11/2020   Chronic pain syndrome 01/11/2020   Pharmacologic therapy 01/11/2020   Disorder of skeletal system 01/11/2020   Problems influencing health status 01/11/2020   Chronic lower extremity pain (1ry area of Pain) (Right) 01/11/2020   Chronic low back pain (2ry area of Pain) (Bilateral) (R>L) w/o sciatica 01/11/2020   Chronic hip pain (3ry area of Pain) (Bilateral) (R>L) 01/11/2020   Chronic knee pain (4th area of Pain) (Bilateral) (R>L) 01/11/2020   Lumbar facet  syndrome (Bilateral) (R>L) 01/11/2020   Nephrolithiasis 12/30/2019   Sepsis secondary to UTI (HCC) 11/27/2019   Sepsis (HCC) 11/27/2019   Acute pyelonephritis 11/27/2019   Hypokalemia    Trochanteric bursitis of hip (Right) 06/27/2019   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 Authorizing Provider  hydrocortisone (ANUSOL-HC) 25 MG suppository Place 1 suppository (25 mg total) rectally every 12 (twelve) hours. 07/02/21 07/02/22 Yes Chesley Noon, MD  acetaminophen-codeine (TYLENOL #3) 300-30 MG tablet Take 1 tablet by mouth every 4 (four) hours as needed for severe pain. 04/08/21   Cuthriell, Delorise Royals, PA-C  amLODipine (NORVASC) 5 MG tablet Take 5 mg by mouth daily. 10/02/17   [provider]  atorvastatin (LIPITOR) 20 MG tablet Take 20 mg by mouth at bedtime. 10/02/17   [provider]  baclofen (LIORESAL) 10 MG tablet Take by mouth. 12/25/19   [provider]  clobetasol (TEMOVATE) 0.05 % external solution clobetasol 0.05 % scalp solution    [provider]  ergocalciferol (VITAMIN D2) 1.25 MG (50000 UT) capsule ergocalciferol (vitamin D2) 1,250 mcg (50,000  unit) capsule 12/25/19   [provider]  Fluocinolone Acetonide Body 0.01 % OIL fluocinolone 0.01 % scalp oil and shower cap  APPLY ON SCALP ONCE DAILY 12/27/19   [provider]  gabapentin (NEURONTIN) 300 MG capsule Take 300 mg by mouth 2 (two) times daily.    [provider]  meloxicam (MOBIC) 15 MG tablet Take 1 tablet (15 mg total) by mouth daily. 04/15/20   Cuthriell, Delorise Royals, PA-C  metFORMIN (GLUCOPHAGE) 500 MG tablet Take 1 tablet (500 mg  total) by mouth 2 (two) times daily with a meal. 02/06/20 02/05/21  Don Perking, [place], MD  mirtazapine (REMERON) 30 MG tablet mirtazapine 30 mg tablet    [provider]  nortriptyline (PAMELOR) 25 MG capsule nortriptyline 25 mg capsule    [provider]  omeprazole (PRILOSEC) 20 MG capsule omeprazole 20 mg capsule,delayed release  TAKE 1 CAPSULE BY MOUTH ONCE DAILY. NEEDS OFFICE VISIT FOR FURTHER REFILLS.    [provider]  predniSONE (DELTASONE) 50 MG tablet Take 1 tablet (50 mg total) by mouth daily with breakfast. 04/15/20   Cuthriell, Delorise Royals, PA-C  Prenatal Vit-Fe Fumarate-FA (PREPLUS) 27-1 MG TABS Take 1 tablet by mouth daily. 09/30/17   [provider]  sulfamethoxazole-trimethoprim (BACTRIM DS) 800-160 MG tablet Take 1 tablet by mouth 2 (two) times daily. 04/08/21   Cuthriell, Delorise Royals, PA-C  traZODone (DESYREL) 150 MG tablet Take 150 mg by mouth at bedtime. 12/25/19   [provider]    Allergies Patient has no known allergies.  Family History  Problem Relation Age of Onset   Breast cancer Maternal Aunt 42    Social History Social History   Tobacco Use   Smoking status: Every Day    Packs/day: 0.50    Types: Cigarettes   Smokeless tobacco: Never  Vaping Use   Vaping Use: Never used  Substance Use Topics   Alcohol use: Yes    Comment: rare   Drug use: Yes    Types: Marijuana    Comment: History of cocaine abuse    Review of Systems  Constitutional: No fever/chills Eyes: No visual changes. ENT: No sore throat. Cardiovascular: Denies chest pain. Respiratory: Denies shortness of breath. Gastrointestinal: No abdominal pain.  No nausea, no vomiting.  No diarrhea.  No constipation.  Positive for rectal bleeding and pain. Genitourinary: Negative for dysuria. Musculoskeletal: Negative for back pain. Skin: Negative for rash. Neurological: Negative for headaches, focal weakness or  numbness.  ____________________________________________   PHYSICAL EXAM:  VITAL SIGNS: ED Triage Vitals  Enc Vitals Group     BP 07/02/21 0549 (!) 164/100     Pulse Rate 07/02/21 0549 63     Resp 07/02/21 0549 18     Temp 07/02/21 0549 98 F (36.7 C)     Temp Source 07/02/21 0549 Oral     SpO2 07/02/21 0549 97 %     Weight 07/02/21 0550 70 lb (31.8 kg)     Height 07/02/21 0550 5' (1.524 m)     Head Circumference --      Peak Flow --      Pain Score 07/02/21 0549 0     Pain Loc --      Pain Edu? --      Excl. in [place]? --     Constitutional: Alert and oriented. Eyes: Conjunctivae are normal. Head: Atraumatic. Nose: No congestion/rhinnorhea. Mouth/Throat: Mucous membranes are moist. Neck: Normal ROM Cardiovascular: Normal rate, regular rhythm. Grossly normal heart sounds. Respiratory:  Normal respiratory effort.  No retractions. Lungs CTAB. Gastrointestinal: Soft and nontender. No distention.  Rectal exam with external hemorrhoids noted, no active bleeding.  Stool is light brown and guaiac negative. Genitourinary: deferred Musculoskeletal: No lower extremity tenderness nor edema. Neurologic:  Normal speech and language. No gross focal neurologic deficits are appreciated. Skin:  Skin is warm, dry and intact. No rash noted. Psychiatric: Mood and affect are normal. Speech and behavior are normal.  ____________________________________________   LABS (all labs ordered are listed, but only abnormal results are displayed)  Labs Reviewed  COMPREHENSIVE METABOLIC PANEL - Abnormal; Notable for the following components:      Result Value   Potassium 3.3 (*)    Glucose, Bld 101 (*)    Calcium 8.5 (*)    All other components within normal limits  URINALYSIS, ROUTINE W REFLEX MICROSCOPIC - Abnormal; Notable for the following components:   Color, Urine YELLOW (*)    APPearance HAZY (*)    Nitrite POSITIVE (*)    Leukocytes,Ua TRACE (*)    Bacteria, UA MANY (*)    All other  components within normal limits  URINE CULTURE  CBC WITH DIFFERENTIAL/PLATELET  LIPASE, BLOOD  TYPE AND SCREEN    PROCEDURES  Procedure(s) performed (including Critical Care):  Procedures   ____________________________________________   INITIAL IMPRESSION / ASSESSMENT AND PLAN / ED COURSE      60 year old female with past medical history of hypertension, hyperlipidemia, diabetes, chronic pain syndrome who presents to the ED complaining of 3 days of rectal bleeding as well as rectal discomfort.  She has hemorrhoids on exam which seems to be the source of her discomfort and bleeding.  There is minimal bleeding at this time and hemoglobin is stable, doubt significant GI bleed.  Remainder of labs are unremarkable and patient is appropriate for discharge home with prescription for Anusol.  She was counseled to follow-up with PCP and to return to the ED for new worsening symptoms, patient agrees with plan.      ____________________________________________   FINAL CLINICAL IMPRESSION(S) / ED DIAGNOSES  Final diagnoses:  Rectal bleeding  Hemorrhoids, unspecified hemorrhoid type  Cocaine abuse [facility])     ED Discharge Orders          Ordered    hydrocortisone (ANUSOL-HC) 25 MG suppository  Every 12 hours        07/02/21 0907             Note:  This document was prepared using Dragon voice recognition software and may include unintentional dictation errors.    Chesley Noon, MD 07/02/21 [phone]

## 2021-07-04 LAB — URINE CULTURE: Culture: >=100000 — AB

## 2021-07-05 NOTE — Progress Notes (Signed)
ED Antimicrobial Stewardship Positive Culture Follow Up   Penny Gill is an 60 y.o. female who presented to [facility] on 07/02/2021 with a chief complaint of  Chief Complaint  Patient presents with   Rectal Bleeding    Recent Results (from the past 720 hour(s))  Urine Culture     Status: Abnormal   Collection Time: 07/02/21  5:52 AM   Specimen: Urine, Clean Catch  Result Value Ref Range Status   Specimen Description   Final    URINE, CLEAN CATCH Performed at [facility], [address], [place], [place] [address]    Special Requests   Final    NONE Performed at [facility], [address] [place], [place] [address]    Culture >=100,000 COLONIES/mL ESCHERICHIA COLI (A)  Final   Report Status 07/04/2021 FINAL  Final   Organism ID, Bacteria ESCHERICHIA COLI (A)  Final      Susceptibility   Escherichia coli - MIC*    AMPICILLIN 8 SENSITIVE Sensitive     CEFAZOLIN <=4 SENSITIVE Sensitive     CEFEPIME <=0.12 SENSITIVE Sensitive     CEFTRIAXONE <=0.25 SENSITIVE Sensitive     CIPROFLOXACIN <=0.25 SENSITIVE Sensitive     GENTAMICIN <=1 SENSITIVE Sensitive     IMIPENEM <=0.25 SENSITIVE Sensitive     NITROFURANTOIN <=16 SENSITIVE Sensitive     TRIMETH/SULFA >=320 RESISTANT Resistant     AMPICILLIN/SULBACTAM <=2 SENSITIVE Sensitive     PIP/TAZO <=4 SENSITIVE Sensitive     * >=100,000 COLONIES/mL ESCHERICHIA COLI    [x]  No symptoms of UTI and UA without pyuria.  Presented with rectal bleeding.  Consistent with asymptomatic bacteriuria and no antibiotic indicated.  New antibiotic prescription: N/A ED Provider: Dr , PharmD, BCPS.   Work Cell: [phone] 07/05/2021 11:26 AM

## 2021-08-02 DIAGNOSIS — K648 Other hemorrhoids: Secondary | ICD-10-CM | POA: Insufficient documentation

## 2021-08-02 DIAGNOSIS — K602 Anal fissure, unspecified: Secondary | ICD-10-CM | POA: Insufficient documentation

## 2022-01-21 DIAGNOSIS — M810 Age-related osteoporosis without current pathological fracture: Secondary | ICD-10-CM | POA: Insufficient documentation

## 2022-03-05 DIAGNOSIS — N12 Tubulo-interstitial nephritis, not specified as acute or chronic: Secondary | ICD-10-CM | POA: Insufficient documentation

## 2022-03-05 DIAGNOSIS — Z720 Tobacco use: Secondary | ICD-10-CM | POA: Insufficient documentation

## 2022-03-05 DIAGNOSIS — G8929 Other chronic pain: Secondary | ICD-10-CM | POA: Insufficient documentation

## 2022-03-05 DIAGNOSIS — M1990 Unspecified osteoarthritis, unspecified site: Secondary | ICD-10-CM | POA: Insufficient documentation

## 2022-03-05 DIAGNOSIS — G4700 Insomnia, unspecified: Secondary | ICD-10-CM | POA: Insufficient documentation

## 2022-07-22 DIAGNOSIS — H60503 Unspecified acute noninfective otitis externa, bilateral: Secondary | ICD-10-CM | POA: Insufficient documentation

## 2022-10-22 ENCOUNTER — Emergency Department
Admission: EM | Admit: 2022-10-22 | Discharge: 2022-10-22 | Disposition: A | Discharge status: Home / Self Care | Payer: Medicaid Other | Attending: Emergency Medicine | Admitting: Emergency Medicine

## 2022-10-22 ENCOUNTER — Other Ambulatory Visit: Payer: Self-pay

## 2022-10-22 DIAGNOSIS — M79675 Pain in left toe(s): Secondary | ICD-10-CM | POA: Insufficient documentation

## 2022-10-22 DIAGNOSIS — I10 Essential (primary) hypertension: Secondary | ICD-10-CM | POA: Insufficient documentation

## 2022-10-22 DIAGNOSIS — M79674 Pain in right toe(s): Secondary | ICD-10-CM | POA: Insufficient documentation

## 2022-10-22 NOTE — ED Triage Notes (Signed)
Pt to ED POV for L and R great toe pain. Pt wanted to see podiatrist but they don't take her medicaid. States L great toe nail is coming off and R great toe nail is hurting and may be ingrown. No pain at this time.

## 2022-10-22 NOTE — Discharge Instructions (Signed)
Please schedule an appointment with the podiatrist.  Please return for any new, worsening, or change in symptoms or other concerns.  It was a pleasure caring for you today.

## 2022-10-22 NOTE — ED Provider Notes (Signed)
[facility] Provider Note    Event Date/Time   First MD Initiated Contact with Patient 10/22/22 1313     (approximate)   History   Toe Pain   HPI  Penny Gill is a 62 y.o. female today with for evaluation of bilateral great toe pain.  Patient reports that her symptoms have been ongoing for several weeks.  She reports that she has been trying to see a podiatrist but has been unable to.  She has not had any redness, swelling, or discharge.  No injuries.  Patient Active Problem List   Diagnosis Date Noted   Dental caries 01/11/2020   Tobacco dependence 01/11/2020   Varicose vein 01/11/2020   Chronic pain syndrome 01/11/2020   Pharmacologic therapy 01/11/2020   Disorder of skeletal system 01/11/2020   Problems influencing health status 01/11/2020   Chronic lower extremity pain (1ry area of Pain) (Right) 01/11/2020   Chronic low back pain (2ry area of Pain) (Bilateral) (R>L) w/o sciatica 01/11/2020   Chronic hip pain (3ry area of Pain) (Bilateral) (R>L) 01/11/2020   Chronic knee pain (4th area of Pain) (Bilateral) (R>L) 01/11/2020   Lumbar facet syndrome (Bilateral) (R>L) 01/11/2020   Nephrolithiasis 12/30/2019   Sepsis secondary to UTI ([place]) 11/27/2019   Sepsis ([facility]) 11/27/2019   Acute pyelonephritis 11/27/2019   Hypokalemia    Trochanteric bursitis of hip (Right) 06/27/2019   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   Hot flashes 10/26/2012   Sickle cell trait ([place]) 10/26/2012   Uterine fibroid 10/26/2012   Hypertension 09/29/2012   Hyperlipemia 09/29/2012   Tinea versicolor 09/29/2012          Physical Exam   Triage Vital Signs: ED Triage Vitals  Enc Vitals Group     BP 10/22/22 1231 (!) 144/100     Pulse Rate 10/22/22 1231 (!) 103     Resp 10/22/22 1231 15     Temp 10/22/22 1231 98.5 F (36.9 C)     Temp Source 10/22/22 1231 Oral     SpO2 10/22/22 1231 97 %     Weight 10/22/22 1232 97 lb (44 kg)     Height 10/22/22 1232  5' (1.524 m)     Head Circumference --      Peak Flow --      Pain Score 10/22/22 1232 0     Pain Loc --      Pain Edu? --      Excl. in [place]? --     Most recent vital signs: Vitals:   10/22/22 1231  BP: (!) 144/100  Pulse: (!) 103  Resp: 15  Temp: 98.5 F (36.9 C)  SpO2: 97%    Physical Exam Vitals and nursing note reviewed.  Constitutional:      General: Awake and alert. No acute distress.    Appearance: Normal appearance. The patient is normal weight.  HENT:     Head: Normocephalic and atraumatic.     Mouth: Mucous membranes are moist.  Eyes:     General: PERRL. Normal EOMs        Right eye: No discharge.        Left eye: No discharge.     Conjunctiva/sclera: Conjunctivae normal.  Cardiovascular:     Rate and Rhythm: Normal rate and regular rhythm.     Pulses: Normal pulses.  Pulmonary:     Effort: Pulmonary effort is normal. No respiratory distress.     Breath sounds: Normal breath sounds.  Abdominal:  Abdomen is soft. There is no abdominal tenderness. No rebound or guarding. No distention. Musculoskeletal:        General: No swelling. Normal range of motion.     Cervical back: Normal range of motion and neck supple.  Left foot: No erythema, ecchymosis, swelling, or wounds noted.  Great toe with onychomycosis noted, mild lifting of the distal portion of the nail, though nail still firmly embedded within the eponychial fold.  No loosening.  No open wounds.  No bleeding. Right foot:  No erythema, ecchymosis, swelling, or wounds noted.  Great toe with lateral portion of nail curving inwards, though no paronychia noted. No swelling or tenderness noted. Nail still firmly embedded within the eponychial fold.  No loosening.  No open wounds.  No bleeding. Skin:    General: Skin is warm and dry.     Capillary Refill: Capillary refill takes less than 2 seconds.     Findings: No rash.  Neurological:     Mental Status: The patient is awake and alert.      ED Results /  Procedures / Treatments   Labs (all labs ordered are listed, but only abnormal results are displayed) Labs Reviewed - No data to display   EKG     RADIOLOGY     PROCEDURES:  Critical Care performed:   Procedures   MEDICATIONS ORDERED IN ED: Medications - No data to display   IMPRESSION / MDM / [place] / ED COURSE  I reviewed the triage vital signs and the nursing notes.   Differential diagnosis includes, but is not limited to, onychomycosis, ingrown nail, paronychia, felon.  Patient is awake and alert, hemodynamically stable and afebrile.  She is in no acute distress.  There is no evidence of trauma or infection to her foot.  She has mild lifting of her left great toenail, likely due to her onychomycosis, however her toenail still firmly embedded within the eponychial fold. Toenail was tacked down with steri strip to keep it in place. There is no nailbed injury or laceration.  There is no evidence of paronychia or felon.  Her right great toe nail is curving very mildly on the lateral aspect, though there is no evidence of infection.  It is nontender to palpation.  I recommended outpatient follow-up with podiatry and the appropriate information was provided.  Patient understands and agrees with plan.  She was discharged in stable condition.   Patient's presentation is most consistent with acute complicated illness / injury requiring diagnostic workup.    FINAL CLINICAL IMPRESSION(S) / ED DIAGNOSES   Final diagnoses:  Toe pain, bilateral     Rx / DC Orders   ED Discharge Orders     None        Note:  This document was prepared using Dragon voice recognition software and may include unintentional dictation errors.   Emeline Gins 10/23/22 1049    Vanessa [place], MD 10/23/22 1110

## 2022-11-06 ENCOUNTER — Ambulatory Visit: Payer: Medicaid Other | Admitting: Podiatry

## 2022-11-06 ENCOUNTER — Encounter: Payer: Self-pay | Admitting: Podiatry

## 2022-11-06 VITALS — BP 150/83 | HR 85

## 2022-11-06 DIAGNOSIS — L600 Ingrowing nail: Secondary | ICD-10-CM

## 2022-11-06 NOTE — Progress Notes (Signed)
Subjective:  Patient ID: Penny Gill, female    DOB: 1960/08/27,  MRN: [id number]  Chief Complaint  Patient presents with   Nail Problem    "I got an ingrown toenail and the toenail on the toenail on the left foot toe is coming off." N - ingrown toenail L - hallux right and left D - 2 mos. - Right, 1 month - left O -gradually right, suddenly - left C - ingrown, tender, sore - right, discolored - coming off A - shoes bother ingrown T - wear bedroom shoes    62 y.o. female presents with the above complaint.  Patient presents with right hallux medial border ingrown painful to touch is progressive gotten worse worse with ambulation worse with pressure.  Rest of HPI see above   Review of Systems: Negative except as noted in the HPI. Denies N/V/F/Ch.  Past Medical History:  Diagnosis Date   Depression    Hyperlipidemia    Hypertension    Kidney stone    Sickle cell trait (HCC)     Current Outpatient Medications:    acetaminophen-codeine (TYLENOL #3) 300-30 MG tablet, Take 1 tablet by mouth every 4 (four) hours as needed for severe pain., Disp: 10 tablet, Rfl: 0   amLODipine (NORVASC) 5 MG tablet, Take 5 mg by mouth daily., Disp: , Rfl: 5   atorvastatin (LIPITOR) 20 MG tablet, Take 20 mg by mouth at bedtime., Disp: , Rfl: 0   baclofen (LIORESAL) 10 MG tablet, Take by mouth., Disp: , Rfl:    clobetasol (TEMOVATE) 0.05 % external solution, clobetasol 0.05 % scalp solution, Disp: , Rfl:    ergocalciferol (VITAMIN D2) 1.25 MG (50000 UT) capsule, ergocalciferol (vitamin D2) 1,250 mcg (50,000 unit) capsule, Disp: , Rfl:    Fluocinolone Acetonide Body 0.01 % OIL, fluocinolone 0.01 % scalp oil and shower cap  APPLY ON SCALP ONCE DAILY, Disp: , Rfl:    gabapentin (NEURONTIN) 300 MG capsule, Take 300 mg by mouth 2 (two) times daily., Disp: , Rfl:    meloxicam (MOBIC) 15 MG tablet, Take 1 tablet (15 mg total) by mouth daily., Disp: 30 tablet, Rfl: 0   mirtazapine (REMERON) 30 MG tablet,  mirtazapine 30 mg tablet, Disp: , Rfl:    nortriptyline (PAMELOR) 25 MG capsule, nortriptyline 25 mg capsule, Disp: , Rfl:    omeprazole (PRILOSEC) 20 MG capsule, omeprazole 20 mg capsule,delayed release  TAKE 1 CAPSULE BY MOUTH ONCE DAILY. NEEDS OFFICE VISIT FOR FURTHER REFILLS., Disp: , Rfl:    predniSONE (DELTASONE) 50 MG tablet, Take 1 tablet (50 mg total) by mouth daily with breakfast., Disp: 5 tablet, Rfl: 0   Prenatal Vit-Fe Fumarate-FA (PREPLUS) 27-1 MG TABS, Take 1 tablet by mouth daily., Disp: , Rfl: 4   sulfamethoxazole-trimethoprim (BACTRIM DS) 800-160 MG tablet, Take 1 tablet by mouth 2 (two) times daily., Disp: 14 tablet, Rfl: 0   traZODone (DESYREL) 150 MG tablet, Take 150 mg by mouth at bedtime., Disp: , Rfl:    metFORMIN (GLUCOPHAGE) 500 MG tablet, Take 1 tablet (500 mg total) by mouth 2 (two) times daily with a meal., Disp: 60 tablet, Rfl: 1  Social History   Tobacco Use  Smoking Status Every Day   Packs/day: .2   Types: Cigarettes, E-cigarettes  Smokeless Tobacco Never  Tobacco Comments   I vape every now and then    No Known Allergies Objective:   Vitals:   11/06/22 1003  BP: (!) 150/83  Pulse: 85   There is  no height or weight on file to calculate BMI. Constitutional Well developed. Well nourished.  Vascular Dorsalis pedis pulses palpable bilaterally. Posterior tibial pulses palpable bilaterally. Capillary refill normal to all digits.  No cyanosis or clubbing noted. Pedal hair growth normal.  Neurologic Normal speech. Oriented to person, place, and time. Epicritic sensation to light touch grossly present bilaterally.  Dermatologic Painful ingrowing nail at medial nail borders of the hallux nail right. No other open wounds. No skin lesions.  Orthopedic: Normal joint ROM without pain or crepitus bilaterally. No visible deformities. No bony tenderness.   Radiographs: None Assessment:   1. Ingrown toenail of right foot    Plan:  Patient was  evaluated and treated and all questions answered.  Ingrown Nail, right -Patient elects to proceed with minor surgery to remove ingrown toenail removal today. Consent reviewed and signed by patient. -Ingrown nail excised. See procedure note. -Educated on post-procedure care including soaking. Written instructions provided and reviewed. -Patient to follow up in 2 weeks for nail check.  Procedure: Excision of Ingrown Toenail Location: Right 1st toe medial nail borders. Anesthesia: Lidocaine 1% plain; 1.5 mL and Marcaine 0.5% plain; 1.5 mL, digital block. Skin Prep: Betadine. Dressing: Silvadene; telfa; dry, sterile, compression dressing. Technique: Following skin prep, the toe was exsanguinated and a tourniquet was secured at the base of the toe. The affected nail border was freed, split with a nail splitter, and excised. Chemical matrixectomy was then performed with phenol and irrigated out with alcohol. The tourniquet was then removed and sterile dressing applied. Disposition: Patient tolerated procedure well. Patient to return in 2 weeks for follow-up.   No follow-ups on file.

## 2023-03-24 DIAGNOSIS — M79602 Pain in left arm: Secondary | ICD-10-CM | POA: Insufficient documentation

## 2023-04-07 ENCOUNTER — Emergency Department
Admission: EM | Admit: 2023-04-07 | Discharge: 2023-04-07 | Disposition: A | Discharge status: Home / Self Care | Payer: MEDICAID | Attending: Emergency Medicine | Admitting: Emergency Medicine

## 2023-04-07 ENCOUNTER — Other Ambulatory Visit: Payer: Self-pay

## 2023-04-07 DIAGNOSIS — K649 Unspecified hemorrhoids: Secondary | ICD-10-CM | POA: Diagnosis present

## 2023-04-07 MED ORDER — HYDROCORTISONE ACETATE 25 MG RE SUPP: 25.0000 mg | Freq: Two times a day (BID) | RECTAL | 0 refills | Status: AC | PRN

## 2023-04-07 MED ORDER — HYDROCORTISONE (PERIANAL) 2.5 % EX CREA: 1.0000 | TOPICAL_CREAM | Freq: Two times a day (BID) | CUTANEOUS | 0 refills | Status: DC

## 2023-04-07 NOTE — Discharge Instructions (Signed)
Take sitz baths 3-4 times daily. Eat foods that have a lot of fiber in them. These include whole grains, beans, nuts, fruits, and vegetables.   Take a warm-water bath (sitz bath) for 20 minutes to ease pain. Do this 3-4 times a day. You may do this in a bathtub. You may also use a portable sitz bath that fits over the toilet.

## 2023-04-07 NOTE — ED Triage Notes (Addendum)
Pt to ED via POV c/o hemorrhoids. Pt says they have been bleeding for 2 weeks. Has appt to get them clipped in October. Uses prep H and nifedipine/lidocaine ointment

## 2023-04-07 NOTE — ED Provider Notes (Signed)
[facility] Emergency Department Provider Note     Event Date/Time   First MD Initiated Contact with Patient 04/07/23 2039     (approximate)   History   Hemorrhoids   HPI  Penny Gill is a 62 y.o. female presents to the ED for evaluation of hemorrhoids x 2 weeks. Patient reports chronic hemorrhoids since having her son. Colonoscopy in 2022 revealed internal hemorrhoids. Reports increasing notable bright red blood with wiping, otherwise painless.  She has tried Preparation H and nifedipine and lidocaine ointment with no relief.  Patient reports scheduled appointment in October for further surgical intervention.  Patient denies constipation, abdominal pain and fever.     Physical Exam   Triage Vital Signs: ED Triage Vitals [04/07/23 2018]  Encounter Vitals Group     BP (!) 145/107     Systolic BP Percentile      Diastolic BP Percentile      Pulse Rate 69     Resp 18     Temp 98.3 F (36.8 C)     Temp Source Oral     SpO2 100 %     Weight      Height      Head Circumference      Peak Flow      Pain Score      Pain Loc      Pain Education      Exclude from Growth Chart     Most recent vital signs: Vitals:   04/07/23 2018  BP: (!) 145/107  Pulse: 69  Resp: 18  Temp: 98.3 F (36.8 C)  SpO2: 100%    General Awake, no distress.  HEENT NCAT. PERRL. EOMI.  CV:  Good peripheral perfusion.  RESP:  Normal effort.  ABD:  No distention.  Other:  Rectal exam performed revealing prolapsed internal hemorrhoid on left side of anus.  Reproducible.  NTTP.  No active bleeding.  No thrombosis.   ED Results / Procedures / Treatments   Labs (all labs ordered are listed, but only abnormal results are displayed) Labs Reviewed - No data to display  No results found.  PROCEDURES:  Critical Care performed: No  Procedures   MEDICATIONS ORDERED IN ED: Medications - No data to display   IMPRESSION / MDM / ASSESSMENT AND PLAN / ED COURSE   I reviewed the triage vital signs and the nursing notes.                               62 y.o. female presents to the emergency department for evaluation and treatment of chronic hemorrhoid. See HPI for further details.   Differential diagnosis includes, but is not limited to hemorrhoid, abscess, fistula, pilonidal cyst.  Patient's presentation is most consistent with exacerbation of chronic illness.  Patient's rectal exam is reassuring of thrombosis.  There seems to be a prolapsed internal hemorrhoid noted on the left side of rectal.  On palpation it is soft and nontender.  No active bleeding at this time.  Patient is prescribed Anusol cream and suppository.  She is advised to take sitz bath's and warm water 3-4 times a day until scheduled GI appointment.  Patient is in stable condition for discharge home. Patient is given ED precautions to return to the ED for any worsening or new symptoms. Patient verbalizes understanding. All questions and concerns were addressed during ED visit.     FINAL CLINICAL  IMPRESSION(S) / ED DIAGNOSES   Final diagnoses:  Hemorrhoids, unspecified hemorrhoid type   Rx / DC Orders   ED Discharge Orders          Ordered    hydrocortisone (ANUSOL-HC) 2.5 % rectal cream  2 times daily        04/07/23 2127    hydrocortisone (ANUSOL-HC) 25 MG suppository  2 times daily PRN        04/07/23 2127             Note:  This document was prepared using Dragon voice recognition software and may include unintentional dictation errors.    Romeo Apple, Chasty Randal A, PA-C 04/08/23 0155    Merwyn Katos, MD 04/10/23 [phone]

## 2023-09-08 DIAGNOSIS — R2689 Other abnormalities of gait and mobility: Secondary | ICD-10-CM | POA: Insufficient documentation

## 2023-10-13 ENCOUNTER — Emergency Department
Admission: EM | Admit: 2023-10-13 | Discharge: 2023-10-13 | Disposition: A | Discharge status: Home / Self Care | Payer: 59 | Attending: Emergency Medicine | Admitting: Emergency Medicine

## 2023-10-13 ENCOUNTER — Other Ambulatory Visit: Payer: Self-pay

## 2023-10-13 DIAGNOSIS — R202 Paresthesia of skin: Secondary | ICD-10-CM | POA: Insufficient documentation

## 2023-10-13 DIAGNOSIS — R200 Anesthesia of skin: Secondary | ICD-10-CM | POA: Diagnosis present

## 2023-10-13 HISTORY — DX: Age-related osteoporosis without current pathological fracture: M81.0

## 2023-10-13 HISTORY — DX: Chronic obstructive pulmonary disease, unspecified: J44.9

## 2023-10-13 LAB — TSH: TSH: 0.665 u[IU]/mL (ref 0.350–4.500)

## 2023-10-13 LAB — MAGNESIUM: Magnesium: 1.9 mg/dL (ref 1.7–2.4)

## 2023-10-13 LAB — CBC WITH DIFFERENTIAL/PLATELET
Abs Immature Granulocytes: 0.01 10*3/uL (ref 0.00–0.07)
Basophils Absolute: 0 10*3/uL (ref 0.0–0.1)
Basophils Relative: 0 %
Eosinophils Absolute: 0.2 10*3/uL (ref 0.0–0.5)
Eosinophils Relative: 3 %
HCT: 36.5 % (ref 36.0–46.0)
Hemoglobin: 12.3 g/dL (ref 12.0–15.0)
Immature Granulocytes: 0 %
Lymphocytes Relative: 41 %
Lymphs Abs: 2.4 10*3/uL (ref 0.7–4.0)
MCH: 30.6 pg (ref 26.0–34.0)
MCHC: 33.7 g/dL (ref 30.0–36.0)
MCV: 90.8 fL (ref 80.0–100.0)
Monocytes Absolute: 0.5 10*3/uL (ref 0.1–1.0)
Monocytes Relative: 9 %
Neutro Abs: 2.8 10*3/uL (ref 1.7–7.7)
Neutrophils Relative %: 47 %
Platelets: 346 10*3/uL (ref 150–400)
RBC: 4.02 MIL/uL (ref 3.87–5.11)
RDW: 13.4 % (ref 11.5–15.5)
WBC: 5.9 10*3/uL (ref 4.0–10.5)
nRBC: 0 % (ref 0.0–0.2)

## 2023-10-13 LAB — COMPREHENSIVE METABOLIC PANEL
ALT: 15 U/L (ref 0–44)
AST: 20 U/L (ref 15–41)
Albumin: 3.8 g/dL (ref 3.5–5.0)
Alkaline Phosphatase: 75 U/L (ref 38–126)
Anion gap: 10 (ref 5–15)
BUN: 19 mg/dL (ref 8–23)
CO2: 24 mmol/L (ref 22–32)
Calcium: 9.2 mg/dL (ref 8.9–10.3)
Chloride: 107 mmol/L (ref 98–111)
Creatinine, Ser: 0.75 mg/dL (ref 0.44–1.00)
GFR, Estimated: >60 mL/min (ref >60)
Glucose, Bld: 136 mg/dL — ABNORMAL HIGH (ref 70–99)
Potassium: 3.6 mmol/L (ref 3.5–5.1)
Sodium: 141 mmol/L (ref 135–145)
Total Bilirubin: 0.6 mg/dL (ref 0.0–1.2)
Total Protein: 6.6 g/dL (ref 6.5–8.1)

## 2023-10-13 NOTE — Discharge Instructions (Addendum)
 Please follow-up with your primary care doctor for further management of your symptoms.

## 2023-10-13 NOTE — ED Notes (Addendum)
 Pt reporting to ED d/t numbness in bilateral hands and feet for 2 days. Pt states that this is interfering with ADL's maing it harm to grip things. Pt ABCs intact. RR even and unlabored. Pt in NAD. Bed in lowest locked position. Call bell in reach. Denies needs at this time.   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

## 2023-10-13 NOTE — ED Notes (Signed)
 First nurse note-pt brought in via ems from work.  Pt has numbness in hands and feet.  No chest pain or sob.  Bp 150/101, pulse 90, temp 98.2, oxygen sats 97%  pt alert.

## 2023-10-13 NOTE — ED Provider Notes (Signed)
 [facility] Provider Note    Event Date/Time   First MD Initiated Contact with Patient 10/13/23 2000     (approximate)   History   Numbness   HPI  Penny Gill is a 63 y.o. female with history of chronic neck and back pain presenting with tingling to her hands and feet.  Started 2 to 3 days ago.  She denies any nausea, vomiting, diarrhea, trauma, new medications or medication changes.  No infectious symptoms.  States that for her back pain, she is gone multiple MRIs and CTs and her doctors at [facility] they are following it.  There are no new changes.  No fevers.  She denies any incontinence or retention.  On independent chart review she was seen by her primary care doctor at [facility] in August of last year, has history of sickle cell trait, prior history of alcohol use, drug use, did tell them at the time that she noticed her hands are going numb.  Had an x-ray done that showed "C5-C6 disc height loss and disc rated degenerative changes noted with subtle retrolisthesis of C5 over C6. 2. Grade 1 anterolisthesis of C7 over T1. 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 3. Scattered mild facet joint osteoarthritis mid and lower C-spine. 4. No acute vertebral body compression fractures",      Physical Exam   Triage Vital Signs: ED Triage Vitals  Encounter Vitals Group     BP 10/13/23 1833 (!) 145/101     Systolic BP Percentile --      Diastolic BP Percentile --      Pulse Rate 10/13/23 1833 87     Resp 10/13/23 1833 17     Temp 10/13/23 1833 98.1 F (36.7 C)     Temp Source 10/13/23 1833 Oral     SpO2 10/13/23 1833 99 %     Weight 10/13/23 1832 82 lb (37.2 kg)     Height 10/13/23 1832 5' (1.524 m)     Head Circumference --      Peak Flow --      Pain Score 10/13/23 1832 8     Pain Loc --      Pain Education --      Exclude from Growth Chart --     Most recent vital signs: Vitals:   10/13/23 1833  BP: (!) 145/101  Pulse: 87  Resp: 17  Temp:  98.1 F (36.7 C)  SpO2: 99%     General: Awake, no distress.  CV:  Good peripheral perfusion.  Resp:  Normal effort.  Abd:  No distention.  Other:  No saddle anesthesia, no midline spinal tenderness, no focal weakness or numbness, she has equal grip strength in bilateral upper extremities, equal strength the lower extremities without numbness.  She has equal radial as well as DP pulses bilaterally.   ED Results / Procedures / Treatments   Labs (all labs ordered are listed, but only abnormal results are displayed) Labs Reviewed  COMPREHENSIVE METABOLIC PANEL - Abnormal; Notable for the following components:      Result Value   Glucose, Bld 136 (*)    All other components within normal limits  CBC WITH DIFFERENTIAL/PLATELET  TSH  MAGNESIUM     PROCEDURES:  Critical Care performed: No  Procedures   MEDICATIONS ORDERED IN ED: Medications - No data to display   IMPRESSION / MDM / ASSESSMENT AND PLAN / ED COURSE  I reviewed the triage vital signs and the  nursing notes.                              Differential diagnosis includes, but is not limited to, paresthesias, no focal neurodeficit, no incontinence, considered but doubt conus medullaris or cauda equina, patient is nontoxic-appearing, no midline spinal tenderness to suggest epidural abscess or discitis or osteomyelitis of the spine, no history of trauma to suggest fracture.  Considered electrolyte derangements, thyroid dysfunction.  Will get labs, TSH.  Patient's presentation is most consistent with acute presentation with potential threat to life or bodily function.  Independent review of labs are below, otherwise patient is safe for outpatient management, no indication for inpatient admission at this time.  Will discharge with strict return precautions.  Instructed to follow-up with primary care doctor for further management of her symptoms.  Clinical Course as of 10/13/23 2120  Tue Oct 13, 2023  2119 Independent  review of labs, no leukocytosis, electrolytes are not severely deranged, LFTs are normal, TSH is normal, mag is normal. [TT]    Clinical Course User Index [TT] Jodie Echevaria, Franchot Erichsen, MD     FINAL CLINICAL IMPRESSION(S) / ED DIAGNOSES   Final diagnoses:  Paresthesia     Rx / DC Orders   ED Discharge Orders     None        Note:  This document was prepared using Dragon voice recognition software and may include unintentional dictation errors.    Claybon Jabs, MD 10/13/23 2120

## 2023-10-13 NOTE — ED Triage Notes (Signed)
 Pt to ED for numbness to both hands and feet since 2 days. Also hard to grip things. Also arthritis pain to neck and back. And legs and arms. Pt does heavy lifting at work. Also has hemorrhoids. In NAD.

## 2023-11-02 DIAGNOSIS — R200 Anesthesia of skin: Secondary | ICD-10-CM | POA: Insufficient documentation

## 2023-11-14 ENCOUNTER — Inpatient Hospital Stay
Admission: EM | Admit: 2023-11-14 | Discharge: 2023-11-17 | DRG: 291 | Disposition: A | Discharge status: Home / Self Care | Attending: Internal Medicine | Admitting: Internal Medicine

## 2023-11-14 ENCOUNTER — Emergency Department

## 2023-11-14 ENCOUNTER — Other Ambulatory Visit: Payer: Self-pay

## 2023-11-14 DIAGNOSIS — M810 Age-related osteoporosis without current pathological fracture: Secondary | ICD-10-CM | POA: Diagnosis present

## 2023-11-14 DIAGNOSIS — Y92410 Unspecified street and highway as the place of occurrence of the external cause: Secondary | ICD-10-CM

## 2023-11-14 DIAGNOSIS — I5A Non-ischemic myocardial injury (non-traumatic): Secondary | ICD-10-CM | POA: Diagnosis not present

## 2023-11-14 DIAGNOSIS — I2489 Other forms of acute ischemic heart disease: Secondary | ICD-10-CM

## 2023-11-14 DIAGNOSIS — F419 Anxiety disorder, unspecified: Secondary | ICD-10-CM | POA: Diagnosis present

## 2023-11-14 DIAGNOSIS — Z7984 Long term (current) use of oral hypoglycemic drugs: Secondary | ICD-10-CM

## 2023-11-14 DIAGNOSIS — Z79899 Other long term (current) drug therapy: Secondary | ICD-10-CM

## 2023-11-14 DIAGNOSIS — Z791 Long term (current) use of non-steroidal anti-inflammatories (NSAID): Secondary | ICD-10-CM

## 2023-11-14 DIAGNOSIS — I5041 Acute combined systolic (congestive) and diastolic (congestive) heart failure: Secondary | ICD-10-CM | POA: Diagnosis present

## 2023-11-14 DIAGNOSIS — I509 Heart failure, unspecified: Secondary | ICD-10-CM | POA: Diagnosis not present

## 2023-11-14 DIAGNOSIS — R9431 Abnormal electrocardiogram [ECG] [EKG]: Secondary | ICD-10-CM

## 2023-11-14 DIAGNOSIS — F17200 Nicotine dependence, unspecified, uncomplicated: Secondary | ICD-10-CM

## 2023-11-14 DIAGNOSIS — I110 Hypertensive heart disease with heart failure: Principal | ICD-10-CM | POA: Diagnosis present

## 2023-11-14 DIAGNOSIS — G894 Chronic pain syndrome: Secondary | ICD-10-CM

## 2023-11-14 DIAGNOSIS — D573 Sickle-cell trait: Secondary | ICD-10-CM | POA: Diagnosis present

## 2023-11-14 DIAGNOSIS — Z681 Body mass index (BMI) 19 or less, adult: Secondary | ICD-10-CM

## 2023-11-14 DIAGNOSIS — F418 Other specified anxiety disorders: Secondary | ICD-10-CM | POA: Diagnosis present

## 2023-11-14 DIAGNOSIS — J439 Emphysema, unspecified: Secondary | ICD-10-CM | POA: Diagnosis present

## 2023-11-14 DIAGNOSIS — E785 Hyperlipidemia, unspecified: Secondary | ICD-10-CM

## 2023-11-14 DIAGNOSIS — I10 Essential (primary) hypertension: Secondary | ICD-10-CM | POA: Diagnosis present

## 2023-11-14 DIAGNOSIS — E43 Unspecified severe protein-calorie malnutrition: Secondary | ICD-10-CM | POA: Diagnosis present

## 2023-11-14 DIAGNOSIS — E1140 Type 2 diabetes mellitus with diabetic neuropathy, unspecified: Secondary | ICD-10-CM | POA: Diagnosis present

## 2023-11-14 DIAGNOSIS — T1490XA Injury, unspecified, initial encounter: Secondary | ICD-10-CM

## 2023-11-14 DIAGNOSIS — J449 Chronic obstructive pulmonary disease, unspecified: Secondary | ICD-10-CM | POA: Diagnosis present

## 2023-11-14 DIAGNOSIS — Z87442 Personal history of urinary calculi: Secondary | ICD-10-CM

## 2023-11-14 DIAGNOSIS — F32A Depression, unspecified: Secondary | ICD-10-CM | POA: Diagnosis present

## 2023-11-14 DIAGNOSIS — Z803 Family history of malignant neoplasm of breast: Secondary | ICD-10-CM

## 2023-11-14 DIAGNOSIS — F17210 Nicotine dependence, cigarettes, uncomplicated: Secondary | ICD-10-CM | POA: Diagnosis present

## 2023-11-14 DIAGNOSIS — F1410 Cocaine abuse, uncomplicated: Secondary | ICD-10-CM | POA: Diagnosis not present

## 2023-11-14 LAB — LIPID PANEL
Cholesterol: 256 mg/dL — ABNORMAL HIGH (ref 0–200)
HDL: 73 mg/dL (ref >40)
LDL Cholesterol: 167 mg/dL — ABNORMAL HIGH (ref 0–99)
Total CHOL/HDL Ratio: 3.5 ratio
Triglycerides: 82 mg/dL (ref <150)
VLDL: 16 mg/dL (ref 0–40)

## 2023-11-14 LAB — URINE DRUG SCREEN, QUALITATIVE (ARMC ONLY)
Amphetamines, Ur Screen: NOT DETECTED
Barbiturates, Ur Screen: NOT DETECTED
Benzodiazepine, Ur Scrn: NOT DETECTED
Cannabinoid 50 Ng, Ur ~~LOC~~: NOT DETECTED
Cocaine Metabolite,Ur ~~LOC~~: POSITIVE — AB
MDMA (Ecstasy)Ur Screen: NOT DETECTED
Methadone Scn, Ur: NOT DETECTED
Opiate, Ur Screen: NOT DETECTED
Phencyclidine (PCP) Ur S: NOT DETECTED
Tricyclic, Ur Screen: NOT DETECTED

## 2023-11-14 LAB — TROPONIN I (HIGH SENSITIVITY)
Troponin I (High Sensitivity): 33 ng/L — ABNORMAL HIGH (ref <18)
Troponin I (High Sensitivity): 41 ng/L — ABNORMAL HIGH (ref <18)

## 2023-11-14 LAB — COMPREHENSIVE METABOLIC PANEL
ALT: 31 U/L (ref 0–44)
AST: 50 U/L — ABNORMAL HIGH (ref 15–41)
Albumin: 3.5 g/dL (ref 3.5–5.0)
Alkaline Phosphatase: 75 U/L (ref 38–126)
Anion gap: 8 (ref 5–15)
BUN: 19 mg/dL (ref 8–23)
CO2: 25 mmol/L (ref 22–32)
Calcium: 8.9 mg/dL (ref 8.9–10.3)
Chloride: 107 mmol/L (ref 98–111)
Creatinine, Ser: 0.66 mg/dL (ref 0.44–1.00)
GFR, Estimated: >60 mL/min (ref >60)
Glucose, Bld: 171 mg/dL — ABNORMAL HIGH (ref 70–99)
Potassium: 3.9 mmol/L (ref 3.5–5.1)
Sodium: 140 mmol/L (ref 135–145)
Total Bilirubin: 0.7 mg/dL (ref 0.0–1.2)
Total Protein: 7.2 g/dL (ref 6.5–8.1)

## 2023-11-14 LAB — URINALYSIS, ROUTINE W REFLEX MICROSCOPIC
Bilirubin Urine: NEGATIVE
Glucose, UA: NEGATIVE mg/dL
Hgb urine dipstick: NEGATIVE
Ketones, ur: NEGATIVE mg/dL
Leukocytes,Ua: NEGATIVE
Nitrite: NEGATIVE
Protein, ur: 100 mg/dL — AB
Specific Gravity, Urine: 1.015 (ref 1.005–1.030)
pH: 7 (ref 5.0–8.0)

## 2023-11-14 LAB — BRAIN NATRIURETIC PEPTIDE: B Natriuretic Peptide: 940.5 pg/mL — ABNORMAL HIGH (ref 0.0–100.0)

## 2023-11-14 LAB — CBC
HCT: 37.4 % (ref 36.0–46.0)
Hemoglobin: 12.2 g/dL (ref 12.0–15.0)
MCH: 30.5 pg (ref 26.0–34.0)
MCHC: 32.6 g/dL (ref 30.0–36.0)
MCV: 93.5 fL (ref 80.0–100.0)
Platelets: 329 10*3/uL (ref 150–400)
RBC: 4 MIL/uL (ref 3.87–5.11)
RDW: 12.7 % (ref 11.5–15.5)
WBC: 6.8 10*3/uL (ref 4.0–10.5)
nRBC: 0 % (ref 0.0–0.2)

## 2023-11-14 LAB — ETHANOL: Alcohol, Ethyl (B): <10 mg/dL (ref <10)

## 2023-11-14 LAB — CBG MONITORING, ED: Glucose-Capillary: 122 mg/dL — ABNORMAL HIGH (ref 70–99)

## 2023-11-14 MED ORDER — FUROSEMIDE 10 MG/ML IJ SOLN
20.0000 mg | Freq: Two times a day (BID) | INTRAMUSCULAR | Status: DC
  Administered 2023-11-15 – 2023-11-17 (×5): 20 mg via INTRAVENOUS
  Filled 2023-11-14: qty 2
  Filled 2023-11-14: qty 4
  Filled 2023-11-14: qty 2
  Filled 2023-11-14 (×2): qty 4

## 2023-11-14 MED ORDER — ENSURE ENLIVE PO LIQD
237.0000 mL | Freq: Two times a day (BID) | ORAL | Status: DC
  Administered 2023-11-15 – 2023-11-17 (×5): 237 mL via ORAL

## 2023-11-14 MED ORDER — ALBUTEROL SULFATE HFA 108 (90 BASE) MCG/ACT IN AERS: 2.0000 | INHALATION_SPRAY | RESPIRATORY_TRACT | Status: DC | PRN

## 2023-11-14 MED ORDER — ALBUTEROL SULFATE (2.5 MG/3ML) 0.083% IN NEBU: 2.5000 mg | INHALATION_SOLUTION | RESPIRATORY_TRACT | Status: DC | PRN

## 2023-11-14 MED ORDER — TRAZODONE HCL 50 MG PO TABS: 150.0000 mg | ORAL_TABLET | Freq: Every evening | ORAL | Status: DC | PRN

## 2023-11-14 MED ORDER — FUROSEMIDE 10 MG/ML IJ SOLN
20.0000 mg | Freq: Once | INTRAMUSCULAR | Status: AC
  Administered 2023-11-14: 20 mg via INTRAVENOUS
  Filled 2023-11-14: qty 4

## 2023-11-14 MED ORDER — IOHEXOL 300 MG/ML  SOLN
75.0000 mL | Freq: Once | INTRAMUSCULAR | Status: AC | PRN
  Administered 2023-11-14: 75 mL via INTRAVENOUS

## 2023-11-14 MED ORDER — INSULIN ASPART 100 UNIT/ML IJ SOLN: 0.0000 [IU] | Freq: Every day | INTRAMUSCULAR | Status: DC

## 2023-11-14 MED ORDER — DM-GUAIFENESIN ER 30-600 MG PO TB12: 1.0000 | ORAL_TABLET | Freq: Two times a day (BID) | ORAL | Status: DC | PRN

## 2023-11-14 MED ORDER — ENOXAPARIN SODIUM 30 MG/0.3ML IJ SOSY
30.0000 mg | PREFILLED_SYRINGE | INTRAMUSCULAR | Status: DC
  Administered 2023-11-15 – 2023-11-17 (×3): 30 mg via SUBCUTANEOUS
  Filled 2023-11-14 (×3): qty 0.3

## 2023-11-14 MED ORDER — AMLODIPINE BESYLATE 5 MG PO TABS
5.0000 mg | ORAL_TABLET | Freq: Every day | ORAL | Status: DC
  Filled 2023-11-14: qty 1

## 2023-11-14 MED ORDER — ONDANSETRON HCL 4 MG/2ML IJ SOLN: 4.0000 mg | Freq: Three times a day (TID) | INTRAMUSCULAR | Status: DC | PRN

## 2023-11-14 MED ORDER — ACETAMINOPHEN 325 MG PO TABS: 650.0000 mg | ORAL_TABLET | Freq: Four times a day (QID) | ORAL | Status: DC | PRN

## 2023-11-14 MED ORDER — PANTOPRAZOLE SODIUM 40 MG PO TBEC: 40.0000 mg | DELAYED_RELEASE_TABLET | Freq: Every day | ORAL | Status: DC

## 2023-11-14 MED ORDER — OXYCODONE-ACETAMINOPHEN 5-325 MG PO TABS
1.0000 | ORAL_TABLET | ORAL | Status: DC | PRN
  Administered 2023-11-17: 1 via ORAL
  Filled 2023-11-14: qty 1

## 2023-11-14 MED ORDER — HYDRALAZINE HCL 20 MG/ML IJ SOLN: 5.0000 mg | INTRAMUSCULAR | Status: DC | PRN

## 2023-11-14 MED ORDER — ASPIRIN 81 MG PO CHEW
324.0000 mg | CHEWABLE_TABLET | Freq: Once | ORAL | Status: AC
  Administered 2023-11-14: 324 mg via ORAL
  Filled 2023-11-14: qty 4

## 2023-11-14 MED ORDER — GABAPENTIN 300 MG PO CAPS
300.0000 mg | ORAL_CAPSULE | Freq: Three times a day (TID) | ORAL | Status: DC
  Administered 2023-11-15 – 2023-11-17 (×11): 300 mg via ORAL
  Filled 2023-11-14 (×11): qty 1

## 2023-11-14 MED ORDER — ASPIRIN 81 MG PO TBEC
81.0000 mg | DELAYED_RELEASE_TABLET | Freq: Every day | ORAL | Status: DC
  Administered 2023-11-15 – 2023-11-17 (×3): 81 mg via ORAL
  Filled 2023-11-14 (×3): qty 1

## 2023-11-14 MED ORDER — INSULIN ASPART 100 UNIT/ML IJ SOLN
0.0000 [IU] | Freq: Three times a day (TID) | INTRAMUSCULAR | Status: DC
  Administered 2023-11-15: 1 [IU] via SUBCUTANEOUS
  Filled 2023-11-14: qty 1

## 2023-11-14 MED ORDER — NICOTINE 21 MG/24HR TD PT24
21.0000 mg | MEDICATED_PATCH | Freq: Every day | TRANSDERMAL | Status: DC
  Administered 2023-11-15 – 2023-11-17 (×3): 21 mg via TRANSDERMAL
  Filled 2023-11-14 (×3): qty 1

## 2023-11-14 MED ORDER — PRENATAL MULTIVITAMIN CH
1.0000 | ORAL_TABLET | Freq: Every day | ORAL | Status: DC
  Administered 2023-11-15 – 2023-11-17 (×3): 1 via ORAL
  Filled 2023-11-14 (×3): qty 1

## 2023-11-14 MED ORDER — NORTRIPTYLINE HCL 25 MG PO CAPS
25.0000 mg | ORAL_CAPSULE | Freq: Every day | ORAL | Status: DC
  Administered 2023-11-15 – 2023-11-16 (×3): 25 mg via ORAL
  Filled 2023-11-14 (×3): qty 1

## 2023-11-14 MED ORDER — ATORVASTATIN CALCIUM 20 MG PO TABS
20.0000 mg | ORAL_TABLET | Freq: Every day | ORAL | Status: DC
  Administered 2023-11-15: 20 mg via ORAL
  Filled 2023-11-14: qty 1

## 2023-11-14 NOTE — ED Notes (Signed)
 Small abrasions noted anterior mid- lower legs. Redness and abrasion noted on abdomen.

## 2023-11-14 NOTE — ED Notes (Signed)
 Pt in CT.
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
## 2023-11-14 NOTE — ED Notes (Signed)
 CCMD called and pt placed on tele.
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
## 2023-11-15 ENCOUNTER — Observation Stay (HOSPITAL_COMMUNITY)
Admit: 2023-11-15 | Discharge: 2023-11-15 | Disposition: A | Discharge status: Home / Self Care | Attending: Internal Medicine | Admitting: Internal Medicine

## 2023-11-15 DIAGNOSIS — Z7984 Long term (current) use of oral hypoglycemic drugs: Secondary | ICD-10-CM | POA: Diagnosis not present

## 2023-11-15 DIAGNOSIS — D573 Sickle-cell trait: Secondary | ICD-10-CM | POA: Diagnosis present

## 2023-11-15 DIAGNOSIS — G894 Chronic pain syndrome: Secondary | ICD-10-CM | POA: Diagnosis present

## 2023-11-15 DIAGNOSIS — I5A Non-ischemic myocardial injury (non-traumatic): Secondary | ICD-10-CM | POA: Diagnosis present

## 2023-11-15 DIAGNOSIS — I5021 Acute systolic (congestive) heart failure: Secondary | ICD-10-CM

## 2023-11-15 DIAGNOSIS — M810 Age-related osteoporosis without current pathological fracture: Secondary | ICD-10-CM | POA: Diagnosis present

## 2023-11-15 DIAGNOSIS — F1410 Cocaine abuse, uncomplicated: Secondary | ICD-10-CM | POA: Diagnosis present

## 2023-11-15 DIAGNOSIS — Z79899 Other long term (current) drug therapy: Secondary | ICD-10-CM | POA: Diagnosis not present

## 2023-11-15 DIAGNOSIS — E43 Unspecified severe protein-calorie malnutrition: Secondary | ICD-10-CM

## 2023-11-15 DIAGNOSIS — Y92410 Unspecified street and highway as the place of occurrence of the external cause: Secondary | ICD-10-CM | POA: Diagnosis not present

## 2023-11-15 DIAGNOSIS — I10 Essential (primary) hypertension: Secondary | ICD-10-CM

## 2023-11-15 DIAGNOSIS — I509 Heart failure, unspecified: Secondary | ICD-10-CM | POA: Diagnosis present

## 2023-11-15 DIAGNOSIS — F32A Depression, unspecified: Secondary | ICD-10-CM | POA: Diagnosis present

## 2023-11-15 DIAGNOSIS — E785 Hyperlipidemia, unspecified: Secondary | ICD-10-CM | POA: Diagnosis present

## 2023-11-15 DIAGNOSIS — F419 Anxiety disorder, unspecified: Secondary | ICD-10-CM | POA: Diagnosis present

## 2023-11-15 DIAGNOSIS — Z803 Family history of malignant neoplasm of breast: Secondary | ICD-10-CM | POA: Diagnosis not present

## 2023-11-15 DIAGNOSIS — Z791 Long term (current) use of non-steroidal anti-inflammatories (NSAID): Secondary | ICD-10-CM | POA: Diagnosis not present

## 2023-11-15 DIAGNOSIS — I110 Hypertensive heart disease with heart failure: Secondary | ICD-10-CM | POA: Diagnosis present

## 2023-11-15 DIAGNOSIS — F17210 Nicotine dependence, cigarettes, uncomplicated: Secondary | ICD-10-CM | POA: Diagnosis present

## 2023-11-15 DIAGNOSIS — Z681 Body mass index (BMI) 19 or less, adult: Secondary | ICD-10-CM | POA: Diagnosis not present

## 2023-11-15 DIAGNOSIS — I5041 Acute combined systolic (congestive) and diastolic (congestive) heart failure: Secondary | ICD-10-CM | POA: Diagnosis present

## 2023-11-15 DIAGNOSIS — E1140 Type 2 diabetes mellitus with diabetic neuropathy, unspecified: Secondary | ICD-10-CM | POA: Diagnosis present

## 2023-11-15 DIAGNOSIS — J439 Emphysema, unspecified: Secondary | ICD-10-CM | POA: Diagnosis present

## 2023-11-15 DIAGNOSIS — Z87442 Personal history of urinary calculi: Secondary | ICD-10-CM | POA: Diagnosis not present

## 2023-11-15 LAB — ECHOCARDIOGRAM COMPLETE
AR max vel: 2.23 cm2
AV Peak grad: 6.1 mmHg
Ao pk vel: 1.23 m/s
Area-P 1/2: 4.49 cm2
Calc EF: 45.3 %
Single Plane A2C EF: 45.8 %
Single Plane A4C EF: 45 %

## 2023-11-15 LAB — BASIC METABOLIC PANEL
Anion gap: 7 (ref 5–15)
BUN: 17 mg/dL (ref 8–23)
CO2: 26 mmol/L (ref 22–32)
Calcium: 8.7 mg/dL — ABNORMAL LOW (ref 8.9–10.3)
Chloride: 106 mmol/L (ref 98–111)
Creatinine, Ser: 0.81 mg/dL (ref 0.44–1.00)
GFR, Estimated: >60 mL/min (ref >60)
Glucose, Bld: 96 mg/dL (ref 70–99)
Potassium: 3.3 mmol/L — ABNORMAL LOW (ref 3.5–5.1)
Sodium: 139 mmol/L (ref 135–145)

## 2023-11-15 LAB — CBG MONITORING, ED
Glucose-Capillary: 141 mg/dL — ABNORMAL HIGH (ref 70–99)
Glucose-Capillary: 119 mg/dL — ABNORMAL HIGH (ref 70–99)
Glucose-Capillary: 106 mg/dL — ABNORMAL HIGH (ref 70–99)
Glucose-Capillary: 126 mg/dL — ABNORMAL HIGH (ref 70–99)

## 2023-11-15 LAB — HIV ANTIBODY (ROUTINE TESTING W REFLEX): HIV Screen 4th Generation wRfx: NONREACTIVE

## 2023-11-15 LAB — MAGNESIUM: Magnesium: 1.9 mg/dL (ref 1.7–2.4)

## 2023-11-15 LAB — TROPONIN I (HIGH SENSITIVITY)
Troponin I (High Sensitivity): 76 ng/L — ABNORMAL HIGH (ref <18)
Troponin I (High Sensitivity): 84 ng/L — ABNORMAL HIGH (ref <18)
Troponin I (High Sensitivity): 80 ng/L — ABNORMAL HIGH (ref <18)

## 2023-11-15 MED ORDER — POTASSIUM CHLORIDE CRYS ER 20 MEQ PO TBCR
40.0000 meq | EXTENDED_RELEASE_TABLET | Freq: Once | ORAL | Status: AC
  Administered 2023-11-15: 40 meq via ORAL
  Filled 2023-11-15: qty 2

## 2023-11-15 NOTE — Progress Notes (Signed)
 PHARMACIST - PHYSICIAN COMMUNICATION  CONCERNING:  Enoxaparin (Lovenox) for DVT Prophylaxis    RECOMMENDATION: Patient was prescribed enoxaprin 40mg  q24 hours for VTE prophylaxis.   There were no vitals filed for this visit.  There is no height or weight on file to calculate BMI.  CrCl cannot be calculated (Unknown ideal weight.).  Patient is candidate for enoxaparin 30mg  every 24 hours based on CrCl <77ml/min or Weight <45kg  DESCRIPTION: Pharmacy has adjusted enoxaparin dose per [facility] policy.  Patient is now receiving enoxaparin 30 mg every 24 hours   Otelia Sergeant, PharmD, [facility] 11/15/2023 2:03 AM

## 2023-11-15 NOTE — ED Notes (Signed)
 Pt ask for water, provided

## 2023-11-15 NOTE — ED Notes (Signed)
 Pt given gown and ambulated to bathroom minimal assist.
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
## 2023-11-15 NOTE — ED Notes (Signed)
 Echo at bedside

## 2023-11-16 DIAGNOSIS — I5041 Acute combined systolic (congestive) and diastolic (congestive) heart failure: Secondary | ICD-10-CM

## 2023-11-16 LAB — BASIC METABOLIC PANEL
Anion gap: 8 (ref 5–15)
BUN: 33 mg/dL — ABNORMAL HIGH (ref 8–23)
CO2: 28 mmol/L (ref 22–32)
Calcium: 9.3 mg/dL (ref 8.9–10.3)
Chloride: 104 mmol/L (ref 98–111)
Creatinine, Ser: 0.76 mg/dL (ref 0.44–1.00)
GFR, Estimated: >60 mL/min (ref >60)
Glucose, Bld: 105 mg/dL — ABNORMAL HIGH (ref 70–99)
Potassium: 4.3 mmol/L (ref 3.5–5.1)
Sodium: 140 mmol/L (ref 135–145)

## 2023-11-16 LAB — GLUCOSE, CAPILLARY: Glucose-Capillary: 144 mg/dL — ABNORMAL HIGH (ref 70–99)

## 2023-11-16 LAB — CBG MONITORING, ED
Glucose-Capillary: 110 mg/dL — ABNORMAL HIGH (ref 70–99)
Glucose-Capillary: 77 mg/dL (ref 70–99)

## 2023-11-16 LAB — HEMOGLOBIN A1C
Hgb A1c MFr Bld: 5.8 % — ABNORMAL HIGH (ref 4.8–5.6)
Mean Plasma Glucose: 120 mg/dL

## 2023-11-16 LAB — MAGNESIUM: Magnesium: 2 mg/dL (ref 1.7–2.4)

## 2023-11-16 MED ORDER — ATORVASTATIN CALCIUM 20 MG PO TABS
40.0000 mg | ORAL_TABLET | Freq: Every day | ORAL | Status: DC
  Administered 2023-11-16: 40 mg via ORAL
  Filled 2023-11-16: qty 2

## 2023-11-16 MED ORDER — SPIRONOLACTONE 12.5 MG HALF TABLET
12.5000 mg | ORAL_TABLET | Freq: Every day | ORAL | Status: DC
  Administered 2023-11-16 – 2023-11-17 (×2): 12.5 mg via ORAL
  Filled 2023-11-16 (×2): qty 1

## 2023-11-16 NOTE — Plan of Care (Signed)

## 2023-11-16 NOTE — ED Notes (Signed)
 Pt ambulated to restroom w steady gate and returned to bed without incident. Monitors reconnected, CB within reach. No further needs expressed by pt. NAD
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
## 2023-11-16 NOTE — ED Notes (Signed)
 Patient ambulated to bedside commode with steady gait. Patient alert and awake.

## 2023-11-17 ENCOUNTER — Other Ambulatory Visit (HOSPITAL_COMMUNITY): Payer: Self-pay

## 2023-11-17 ENCOUNTER — Telehealth (HOSPITAL_COMMUNITY): Payer: Self-pay | Admitting: Pharmacy Technician

## 2023-11-17 ENCOUNTER — Other Ambulatory Visit: Payer: Self-pay

## 2023-11-17 DIAGNOSIS — I5041 Acute combined systolic (congestive) and diastolic (congestive) heart failure: Secondary | ICD-10-CM | POA: Diagnosis not present

## 2023-11-17 LAB — BASIC METABOLIC PANEL
Anion gap: 10 (ref 5–15)
BUN: 46 mg/dL — ABNORMAL HIGH (ref 8–23)
CO2: 28 mmol/L (ref 22–32)
Calcium: 9.1 mg/dL (ref 8.9–10.3)
Chloride: 99 mmol/L (ref 98–111)
Creatinine, Ser: 0.91 mg/dL (ref 0.44–1.00)
GFR, Estimated: >60 mL/min (ref >60)
Glucose, Bld: 113 mg/dL — ABNORMAL HIGH (ref 70–99)
Potassium: 4 mmol/L (ref 3.5–5.1)
Sodium: 137 mmol/L (ref 135–145)

## 2023-11-17 MED ORDER — SPIRONOLACTONE 25 MG PO TABS
12.5000 mg | ORAL_TABLET | Freq: Every day | ORAL | 2 refills | Status: DC
  Filled 2023-11-17: qty 15, 30d supply, fill #0

## 2023-11-17 MED ORDER — ATORVASTATIN CALCIUM 40 MG PO TABS
40.0000 mg | ORAL_TABLET | Freq: Every day | ORAL | 2 refills | Status: DC
  Filled 2023-11-17: qty 30, 30d supply, fill #0

## 2023-11-17 MED ORDER — ENSURE ENLIVE PO LIQD: 237.0000 mL | Freq: Two times a day (BID) | ORAL | Status: DC

## 2023-11-17 MED ORDER — ASPIRIN 81 MG PO TBEC
81.0000 mg | DELAYED_RELEASE_TABLET | Freq: Every day | ORAL | 2 refills | Status: DC
  Filled 2023-11-17: qty 30, 30d supply, fill #0

## 2023-11-17 MED ORDER — FUROSEMIDE 20 MG PO TABS
20.0000 mg | ORAL_TABLET | Freq: Every day | ORAL | 2 refills | Status: DC
  Filled 2023-11-17: qty 30, 30d supply, fill #0

## 2023-11-17 MED ORDER — FUROSEMIDE 20 MG PO TABS: 20.0000 mg | ORAL_TABLET | Freq: Every day | ORAL | Status: DC

## 2023-11-17 NOTE — Plan of Care (Signed)
  Problem: Education: Goal: Ability to describe self-care measures that may prevent or decrease complications (Diabetes Survival Skills Education) will improve Outcome: Completed/Met Goal: Individualized Educational Video(s) Outcome: Completed/Met   Problem: Coping: Goal: Ability to adjust to condition or change in health will improve Outcome: Completed/Met   Problem: Fluid Volume: Goal: Ability to maintain a balanced intake and output will improve Outcome: Completed/Met   Problem: Health Behavior/Discharge Planning: Goal: Ability to identify and utilize available resources and services will improve Outcome: Completed/Met Goal: Ability to manage health-related needs will improve Outcome: Completed/Met   Problem: Metabolic: Goal: Ability to maintain appropriate glucose levels will improve Outcome: Completed/Met   Problem: Nutritional: Goal: Maintenance of adequate nutrition will improve Outcome: Completed/Met Goal: Progress toward achieving an optimal weight will improve Outcome: Completed/Met   Problem: Skin Integrity: Goal: Risk for impaired skin integrity will decrease Outcome: Completed/Met   Problem: Tissue Perfusion: Goal: Adequacy of tissue perfusion will improve Outcome: Completed/Met   Problem: Education: Goal: Knowledge of General Education information will improve Description: Including pain rating scale, medication(s)/side effects and non-pharmacologic comfort measures Outcome: Completed/Met   Problem: Health Behavior/Discharge Planning: Goal: Ability to manage health-related needs will improve Outcome: Completed/Met   Problem: Clinical Measurements: Goal: Ability to maintain clinical measurements within normal limits will improve Outcome: Completed/Met Goal: Will remain free from infection Outcome: Completed/Met Goal: Diagnostic test results will improve Outcome: Completed/Met Goal: Respiratory complications will improve Outcome: Completed/Met Goal:  Cardiovascular complication will be avoided Outcome: Completed/Met   Problem: Activity: Goal: Risk for activity intolerance will decrease Outcome: Completed/Met   Problem: Nutrition: Goal: Adequate nutrition will be maintained Outcome: Completed/Met   Problem: Coping: Goal: Level of anxiety will decrease Outcome: Completed/Met   Problem: Elimination: Goal: Will not experience complications related to bowel motility Outcome: Completed/Met Goal: Will not experience complications related to urinary retention Outcome: Completed/Met   Problem: Pain Managment: Goal: General experience of comfort will improve and/or be controlled Outcome: Completed/Met   Problem: Safety: Goal: Ability to remain free from injury will improve Outcome: Completed/Met   Problem: Skin Integrity: Goal: Risk for impaired skin integrity will decrease Outcome: Completed/Met   Problem: Education: Goal: Ability to demonstrate management of disease process will improve Outcome: Completed/Met Goal: Ability to verbalize understanding of medication therapies will improve Outcome: Completed/Met Goal: Individualized Educational Video(s) Outcome: Completed/Met   Problem: Activity: Goal: Capacity to carry out activities will improve Outcome: Completed/Met   Problem: Cardiac: Goal: Ability to achieve and maintain adequate cardiopulmonary perfusion will improve Outcome: Completed/Met

## 2023-11-17 NOTE — Progress Notes (Signed)
 Transition of Care [facility]) - Inpatient Brief Assessment   Patient Details  Name: Penny Gill MRN: [id number] Date of Birth: February 27, 1961  Transition of Care [facility]) CM/SW Contact:    Truddie Hidden, RN Phone Number: 11/17/2023, 9:45 AM   Clinical Narrative: TOC screening completed.  No PCP listed.  PCP list added to AVS.    Transition of Care Asessment: Insurance and Status: Insurance coverage has been reviewed Patient has primary care physician: No   Prior level of function:: Independent Prior/Current Home Services: No current home services Social Drivers of Health Review: SDOH reviewed no interventions necessary Readmission risk has been reviewed: Yes Transition of care needs: no transition of care needs at this time
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
## 2023-11-17 NOTE — Plan of Care (Signed)

## 2023-11-17 NOTE — Discharge Instructions (Signed)
 Some PCP options in [place] area- not a comprehensive list  [facility]- [phone] [facility]- [phone] [facility]- [phone] [facility]- [phone] [facility]- [phone] [facility]- [phone]  or [facility] Physician Referral Line [phone]
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
## 2023-11-17 NOTE — Telephone Encounter (Signed)
 Patient Product/process development scientist completed.    The patient is insured through [place] [employer].     Ran test claim for Entresto 24-26 mg and the current 30 day co-pay is $4.00.  Ran test claim for Jardiance 10 mg and Requires Prior Authorization  Ran test claim for Farxgia 10 mg and Not on Formulary  This test claim was processed through [employer]- copay amounts may vary at other pharmacies due to [employer], or as the patient moves through the different stages of their insurance plan.     Roland Earl, CPHT Pharmacy Technician III Certified Patient Advocate [facility] Patient Advocate Team Direct Number: [phone]  Fax: [phone]

## 2023-12-20 ENCOUNTER — Emergency Department: Payer: MEDICAID

## 2023-12-20 ENCOUNTER — Inpatient Hospital Stay
Admission: EM | Admit: 2023-12-20 | Discharge: 2023-12-23 | DRG: 286 | Disposition: A | Discharge status: Home Health | Payer: MEDICAID | Attending: Osteopathic Medicine | Admitting: Osteopathic Medicine

## 2023-12-20 ENCOUNTER — Other Ambulatory Visit: Payer: Self-pay

## 2023-12-20 DIAGNOSIS — Z87440 Personal history of urinary (tract) infections: Secondary | ICD-10-CM

## 2023-12-20 DIAGNOSIS — M47816 Spondylosis without myelopathy or radiculopathy, lumbar region: Secondary | ICD-10-CM

## 2023-12-20 DIAGNOSIS — Z5320 Procedure and treatment not carried out because of patient's decision for unspecified reasons: Secondary | ICD-10-CM | POA: Diagnosis not present

## 2023-12-20 DIAGNOSIS — E1165 Type 2 diabetes mellitus with hyperglycemia: Secondary | ICD-10-CM | POA: Diagnosis present

## 2023-12-20 DIAGNOSIS — I2489 Other forms of acute ischemic heart disease: Secondary | ICD-10-CM | POA: Diagnosis present

## 2023-12-20 DIAGNOSIS — J441 Chronic obstructive pulmonary disease with (acute) exacerbation: Secondary | ICD-10-CM | POA: Diagnosis present

## 2023-12-20 DIAGNOSIS — F32A Depression, unspecified: Secondary | ICD-10-CM | POA: Diagnosis present

## 2023-12-20 DIAGNOSIS — Z7982 Long term (current) use of aspirin: Secondary | ICD-10-CM

## 2023-12-20 DIAGNOSIS — Z681 Body mass index (BMI) 19 or less, adult: Secondary | ICD-10-CM

## 2023-12-20 DIAGNOSIS — I10 Essential (primary) hypertension: Secondary | ICD-10-CM | POA: Diagnosis present

## 2023-12-20 DIAGNOSIS — Z87442 Personal history of urinary calculi: Secondary | ICD-10-CM

## 2023-12-20 DIAGNOSIS — Z79899 Other long term (current) drug therapy: Secondary | ICD-10-CM

## 2023-12-20 DIAGNOSIS — J439 Emphysema, unspecified: Secondary | ICD-10-CM | POA: Diagnosis present

## 2023-12-20 DIAGNOSIS — R7989 Other specified abnormal findings of blood chemistry: Secondary | ICD-10-CM

## 2023-12-20 DIAGNOSIS — I959 Hypotension, unspecified: Secondary | ICD-10-CM | POA: Diagnosis not present

## 2023-12-20 DIAGNOSIS — E785 Hyperlipidemia, unspecified: Secondary | ICD-10-CM | POA: Diagnosis present

## 2023-12-20 DIAGNOSIS — J449 Chronic obstructive pulmonary disease, unspecified: Secondary | ICD-10-CM | POA: Diagnosis present

## 2023-12-20 DIAGNOSIS — I509 Heart failure, unspecified: Secondary | ICD-10-CM | POA: Diagnosis not present

## 2023-12-20 DIAGNOSIS — F1410 Cocaine abuse, uncomplicated: Secondary | ICD-10-CM | POA: Diagnosis not present

## 2023-12-20 DIAGNOSIS — I427 Cardiomyopathy due to drug and external agent: Secondary | ICD-10-CM

## 2023-12-20 DIAGNOSIS — R011 Cardiac murmur, unspecified: Secondary | ICD-10-CM | POA: Diagnosis present

## 2023-12-20 DIAGNOSIS — I110 Hypertensive heart disease with heart failure: Principal | ICD-10-CM | POA: Diagnosis present

## 2023-12-20 DIAGNOSIS — I5023 Acute on chronic systolic (congestive) heart failure: Secondary | ICD-10-CM | POA: Diagnosis not present

## 2023-12-20 DIAGNOSIS — Z716 Tobacco abuse counseling: Secondary | ICD-10-CM

## 2023-12-20 DIAGNOSIS — Z1152 Encounter for screening for COVID-19: Secondary | ICD-10-CM

## 2023-12-20 DIAGNOSIS — D573 Sickle-cell trait: Secondary | ICD-10-CM | POA: Diagnosis present

## 2023-12-20 DIAGNOSIS — G894 Chronic pain syndrome: Secondary | ICD-10-CM

## 2023-12-20 DIAGNOSIS — R64 Cachexia: Secondary | ICD-10-CM | POA: Diagnosis present

## 2023-12-20 DIAGNOSIS — F17200 Nicotine dependence, unspecified, uncomplicated: Secondary | ICD-10-CM | POA: Diagnosis present

## 2023-12-20 DIAGNOSIS — E119 Type 2 diabetes mellitus without complications: Secondary | ICD-10-CM

## 2023-12-20 DIAGNOSIS — F17210 Nicotine dependence, cigarettes, uncomplicated: Secondary | ICD-10-CM | POA: Diagnosis present

## 2023-12-20 DIAGNOSIS — R000 Tachycardia, unspecified: Secondary | ICD-10-CM | POA: Diagnosis present

## 2023-12-20 DIAGNOSIS — M7061 Trochanteric bursitis, right hip: Secondary | ICD-10-CM

## 2023-12-20 DIAGNOSIS — R001 Bradycardia, unspecified: Secondary | ICD-10-CM | POA: Diagnosis not present

## 2023-12-20 DIAGNOSIS — F1490 Cocaine use, unspecified, uncomplicated: Secondary | ICD-10-CM

## 2023-12-20 DIAGNOSIS — I493 Ventricular premature depolarization: Secondary | ICD-10-CM | POA: Diagnosis not present

## 2023-12-20 DIAGNOSIS — M810 Age-related osteoporosis without current pathological fracture: Secondary | ICD-10-CM | POA: Diagnosis present

## 2023-12-20 DIAGNOSIS — J9601 Acute respiratory failure with hypoxia: Secondary | ICD-10-CM

## 2023-12-20 LAB — URINE DRUG SCREEN, QUALITATIVE (ARMC ONLY)
Amphetamines, Ur Screen: NOT DETECTED
Barbiturates, Ur Screen: NOT DETECTED
Benzodiazepine, Ur Scrn: NOT DETECTED
Cannabinoid 50 Ng, Ur ~~LOC~~: NOT DETECTED
Cocaine Metabolite,Ur ~~LOC~~: POSITIVE — AB
MDMA (Ecstasy)Ur Screen: NOT DETECTED
Methadone Scn, Ur: NOT DETECTED
Opiate, Ur Screen: NOT DETECTED
Phencyclidine (PCP) Ur S: NOT DETECTED
Tricyclic, Ur Screen: NOT DETECTED

## 2023-12-20 LAB — CBC WITH DIFFERENTIAL/PLATELET
Abs Immature Granulocytes: 0.02 10*3/uL (ref 0.00–0.07)
Basophils Absolute: 0 10*3/uL (ref 0.0–0.1)
Basophils Relative: 0 %
Eosinophils Absolute: 0.1 10*3/uL (ref 0.0–0.5)
Eosinophils Relative: 2 %
HCT: 42.4 % (ref 36.0–46.0)
Hemoglobin: 14 g/dL (ref 12.0–15.0)
Immature Granulocytes: 0 %
Lymphocytes Relative: 37 %
Lymphs Abs: 2.5 10*3/uL (ref 0.7–4.0)
MCH: 30 pg (ref 26.0–34.0)
MCHC: 33 g/dL (ref 30.0–36.0)
MCV: 91 fL (ref 80.0–100.0)
Monocytes Absolute: 0.5 10*3/uL (ref 0.1–1.0)
Monocytes Relative: 7 %
Neutro Abs: 3.7 10*3/uL (ref 1.7–7.7)
Neutrophils Relative %: 54 %
Platelets: 356 10*3/uL (ref 150–400)
RBC: 4.66 MIL/uL (ref 3.87–5.11)
RDW: 12 % (ref 11.5–15.5)
WBC: 6.8 10*3/uL (ref 4.0–10.5)
nRBC: 0 % (ref 0.0–0.2)

## 2023-12-20 LAB — RESP PANEL BY RT-PCR (RSV, FLU A&B, COVID)  RVPGX2
Influenza A by PCR: NEGATIVE
Influenza B by PCR: NEGATIVE
Resp Syncytial Virus by PCR: NEGATIVE
SARS Coronavirus 2 by RT PCR: NEGATIVE

## 2023-12-20 LAB — COMPREHENSIVE METABOLIC PANEL WITH GFR
ALT: 33 U/L (ref 0–44)
AST: 36 U/L (ref 15–41)
Albumin: 3.9 g/dL (ref 3.5–5.0)
Alkaline Phosphatase: 91 U/L (ref 38–126)
Anion gap: 11 (ref 5–15)
BUN: 16 mg/dL (ref 8–23)
CO2: 26 mmol/L (ref 22–32)
Calcium: 9.2 mg/dL (ref 8.9–10.3)
Chloride: 103 mmol/L (ref 98–111)
Creatinine, Ser: 0.86 mg/dL (ref 0.44–1.00)
GFR, Estimated: >60 mL/min (ref >60)
Glucose, Bld: 137 mg/dL — ABNORMAL HIGH (ref 70–99)
Potassium: 3.6 mmol/L (ref 3.5–5.1)
Sodium: 140 mmol/L (ref 135–145)
Total Bilirubin: 0.7 mg/dL (ref 0.0–1.2)
Total Protein: 7.4 g/dL (ref 6.5–8.1)

## 2023-12-20 LAB — GLUCOSE, CAPILLARY
Glucose-Capillary: 172 mg/dL — ABNORMAL HIGH (ref 70–99)
Glucose-Capillary: 210 mg/dL — ABNORMAL HIGH (ref 70–99)

## 2023-12-20 LAB — HIV ANTIBODY (ROUTINE TESTING W REFLEX): HIV Screen 4th Generation wRfx: NONREACTIVE

## 2023-12-20 LAB — BLOOD GAS, VENOUS
Acid-Base Excess: 2.7 mmol/L — ABNORMAL HIGH (ref 0.0–2.0)
Bicarbonate: 28.9 mmol/L — ABNORMAL HIGH (ref 20.0–28.0)
O2 Saturation: 57.8 %
Patient temperature: 37
pCO2, Ven: 50 mmHg (ref 44–60)
pH, Ven: 7.37 (ref 7.25–7.43)
pO2, Ven: 34 mmHg (ref 32–45)

## 2023-12-20 LAB — BRAIN NATRIURETIC PEPTIDE: B Natriuretic Peptide: 1612.1 pg/mL — ABNORMAL HIGH (ref 0.0–100.0)

## 2023-12-20 LAB — TROPONIN I (HIGH SENSITIVITY)
Troponin I (High Sensitivity): 52 ng/L — ABNORMAL HIGH (ref <18)
Troponin I (High Sensitivity): 53 ng/L — ABNORMAL HIGH (ref <18)
Troponin I (High Sensitivity): 61 ng/L — ABNORMAL HIGH (ref <18)
Troponin I (High Sensitivity): 52 ng/L — ABNORMAL HIGH (ref <18)

## 2023-12-20 LAB — CBG MONITORING, ED: Glucose-Capillary: 328 mg/dL — ABNORMAL HIGH (ref 70–99)

## 2023-12-20 MED ORDER — IPRATROPIUM-ALBUTEROL 0.5-2.5 (3) MG/3ML IN SOLN
6.0000 mL | Freq: Once | RESPIRATORY_TRACT | Status: AC
  Administered 2023-12-20: 6 mL via RESPIRATORY_TRACT
  Filled 2023-12-20: qty 6

## 2023-12-20 MED ORDER — FUROSEMIDE 10 MG/ML IJ SOLN
60.0000 mg | Freq: Two times a day (BID) | INTRAMUSCULAR | Status: DC
  Administered 2023-12-20 – 2023-12-21 (×2): 60 mg via INTRAVENOUS
  Filled 2023-12-20 (×2): qty 6

## 2023-12-20 MED ORDER — ATORVASTATIN CALCIUM 20 MG PO TABS
40.0000 mg | ORAL_TABLET | Freq: Every day | ORAL | Status: DC
  Administered 2023-12-20 – 2023-12-22 (×3): 40 mg via ORAL
  Filled 2023-12-20 (×3): qty 2

## 2023-12-20 MED ORDER — FOLIC ACID 1 MG PO TABS
1.0000 mg | ORAL_TABLET | Freq: Every day | ORAL | Status: DC
  Administered 2023-12-20 – 2023-12-23 (×3): 1 mg via ORAL
  Filled 2023-12-20 (×3): qty 1

## 2023-12-20 MED ORDER — INSULIN ASPART 100 UNIT/ML IJ SOLN
0.0000 [IU] | Freq: Three times a day (TID) | INTRAMUSCULAR | Status: DC
  Administered 2023-12-20: 2 [IU] via SUBCUTANEOUS
  Administered 2023-12-20: 7 [IU] via SUBCUTANEOUS
  Administered 2023-12-21: 3 [IU] via SUBCUTANEOUS
  Administered 2023-12-21 (×2): 1 [IU] via SUBCUTANEOUS
  Administered 2023-12-22: 3 [IU] via SUBCUTANEOUS
  Administered 2023-12-23: 2 [IU] via SUBCUTANEOUS
  Filled 2023-12-20 (×6): qty 1

## 2023-12-20 MED ORDER — FUROSEMIDE 40 MG PO TABS: 20.0000 mg | ORAL_TABLET | Freq: Every day | ORAL | Status: DC

## 2023-12-20 MED ORDER — ENOXAPARIN SODIUM 30 MG/0.3ML IJ SOSY
30.0000 mg | PREFILLED_SYRINGE | INTRAMUSCULAR | Status: DC
  Administered 2023-12-20 – 2023-12-23 (×3): 30 mg via SUBCUTANEOUS
  Filled 2023-12-20 (×3): qty 0.3

## 2023-12-20 MED ORDER — ADULT MULTIVITAMIN W/MINERALS CH
1.0000 | ORAL_TABLET | Freq: Every day | ORAL | Status: DC
  Administered 2023-12-20 – 2023-12-23 (×3): 1 via ORAL
  Filled 2023-12-20 (×3): qty 1

## 2023-12-20 MED ORDER — ACETAMINOPHEN 325 MG PO TABS: 650.0000 mg | ORAL_TABLET | Freq: Four times a day (QID) | ORAL | Status: DC | PRN

## 2023-12-20 MED ORDER — ONDANSETRON HCL 4 MG/2ML IJ SOLN: 4.0000 mg | Freq: Four times a day (QID) | INTRAMUSCULAR | Status: DC | PRN

## 2023-12-20 MED ORDER — PREDNISONE 20 MG PO TABS
40.0000 mg | ORAL_TABLET | Freq: Every day | ORAL | Status: DC
  Administered 2023-12-21 – 2023-12-23 (×3): 40 mg via ORAL
  Filled 2023-12-20 (×3): qty 2

## 2023-12-20 MED ORDER — THIAMINE MONONITRATE 100 MG PO TABS
100.0000 mg | ORAL_TABLET | Freq: Every day | ORAL | Status: DC
  Administered 2023-12-20 – 2023-12-23 (×3): 100 mg via ORAL
  Filled 2023-12-20 (×3): qty 1

## 2023-12-20 MED ORDER — LORAZEPAM 1 MG PO TABS: 1.0000 mg | ORAL_TABLET | ORAL | Status: AC | PRN

## 2023-12-20 MED ORDER — SODIUM CHLORIDE 0.9% FLUSH
3.0000 mL | Freq: Two times a day (BID) | INTRAVENOUS | Status: DC
  Administered 2023-12-20 – 2023-12-23 (×6): 3 mL via INTRAVENOUS

## 2023-12-20 MED ORDER — NITROGLYCERIN 2 % TD OINT
1.0000 [in_us] | TOPICAL_OINTMENT | Freq: Once | TRANSDERMAL | Status: AC
Start: 2023-12-20 — End: 2023-12-20
  Administered 2023-12-20: 1 [in_us] via TOPICAL
  Filled 2023-12-20: qty 1

## 2023-12-20 MED ORDER — ASPIRIN 81 MG PO TBEC
81.0000 mg | DELAYED_RELEASE_TABLET | Freq: Every day | ORAL | Status: DC
  Administered 2023-12-21: 81 mg via ORAL
  Filled 2023-12-20: qty 1

## 2023-12-20 MED ORDER — ENSURE ENLIVE PO LIQD
237.0000 mL | Freq: Two times a day (BID) | ORAL | Status: DC
  Administered 2023-12-20 – 2023-12-23 (×5): 237 mL via ORAL

## 2023-12-20 MED ORDER — ALBUTEROL SULFATE (2.5 MG/3ML) 0.083% IN NEBU: 2.5000 mg | INHALATION_SOLUTION | RESPIRATORY_TRACT | Status: DC | PRN

## 2023-12-20 MED ORDER — SODIUM CHLORIDE 0.9 % IV SOLN: 250.0000 mL | INTRAVENOUS | Status: AC | PRN

## 2023-12-20 MED ORDER — LORAZEPAM 0.5 MG PO TABS: 0.5000 mg | ORAL_TABLET | Freq: Four times a day (QID) | ORAL | Status: AC | PRN

## 2023-12-20 MED ORDER — NICOTINE 7 MG/24HR TD PT24
7.0000 mg | MEDICATED_PATCH | Freq: Every day | TRANSDERMAL | Status: DC
  Administered 2023-12-20 – 2023-12-23 (×3): 7 mg via TRANSDERMAL
  Filled 2023-12-20 (×4): qty 1

## 2023-12-20 MED ORDER — SPIRONOLACTONE 12.5 MG HALF TABLET
12.5000 mg | ORAL_TABLET | Freq: Every day | ORAL | Status: DC
  Administered 2023-12-21 – 2023-12-23 (×2): 12.5 mg via ORAL
  Filled 2023-12-20 (×4): qty 1

## 2023-12-20 MED ORDER — FUROSEMIDE 10 MG/ML IJ SOLN
80.0000 mg | Freq: Once | INTRAMUSCULAR | Status: AC
  Administered 2023-12-20: 80 mg via INTRAVENOUS
  Filled 2023-12-20: qty 8

## 2023-12-20 MED ORDER — THIAMINE HCL 100 MG/ML IJ SOLN: 100.0000 mg | Freq: Every day | INTRAMUSCULAR | Status: DC

## 2023-12-20 MED ORDER — SODIUM CHLORIDE 0.9% FLUSH
3.0000 mL | INTRAVENOUS | Status: DC | PRN
  Administered 2023-12-20 (×2): 3 mL via INTRAVENOUS

## 2023-12-20 MED ORDER — IPRATROPIUM-ALBUTEROL 0.5-2.5 (3) MG/3ML IN SOLN
3.0000 mL | Freq: Four times a day (QID) | RESPIRATORY_TRACT | Status: DC
  Administered 2023-12-20 (×3): 3 mL via RESPIRATORY_TRACT
  Filled 2023-12-20 (×3): qty 3

## 2023-12-20 MED ORDER — ONDANSETRON HCL 4 MG PO TABS: 4.0000 mg | ORAL_TABLET | Freq: Four times a day (QID) | ORAL | Status: DC | PRN

## 2023-12-20 MED ORDER — METHYLPREDNISOLONE SODIUM SUCC 125 MG IJ SOLR
125.0000 mg | Freq: Two times a day (BID) | INTRAMUSCULAR | Status: AC
  Administered 2023-12-20 (×2): 125 mg via INTRAVENOUS
  Filled 2023-12-20 (×2): qty 2

## 2023-12-20 NOTE — ED Notes (Signed)
 This RN was able to decrease  02 to 3L [place] and pt's 02 stayed at 99%

## 2023-12-20 NOTE — Assessment & Plan Note (Signed)
 -  Continue Lipitor

## 2023-12-20 NOTE — ED Provider Notes (Signed)
 [facility] Provider Note    Event Date/Time   First MD Initiated Contact with Patient 12/20/23 [phone]     (approximate)   History   Shortness of Breath   HPI  Penny Gill is a 63 y.o. female   Past medical history of CHF and COPD, crack cocaine use, hypertension hyperlipidemia, sickle cell trait who presents to the Emergency Department with shortness of breath over the last 2 days.  Dry cough.  No peripheral edema.  She did use crack cocaine at the onset of symptoms 2 days ago.  She uses a vape as well.  She is not on home oxygen but she required oxygen today as EMS notes that she was hypoxemic below 90% but did not clarify exactly how much, and put her on 3 L nasal cannula.  She uses an albuterol  inhaler at home but ran out and EMS gave her 2 DuoNeb's without much improvement in her symptoms.  Independent Historian contributed to assessment above: Her partner is at bedside to corroborate information past medical history above  External Medical Documents Reviewed: Discharge summary from March 2025 when she was admitted for CHF exacerbation and crack cocaine use at that time as well      Physical Exam   Triage Vital Signs: ED Triage Vitals  Encounter Vitals Group     BP 12/20/23 0447 (!) 159/111     Systolic BP Percentile --      Diastolic BP Percentile --      Pulse Rate 12/20/23 0447 (!) 104     Resp 12/20/23 0447 20     Temp 12/20/23 0447 98.2 F (36.8 C)     Temp Source 12/20/23 0447 Oral     SpO2 12/20/23 0447 96 %     Weight 12/20/23 0449 72 lb (32.7 kg)     Height 12/20/23 0449 5' (1.524 m)     Head Circumference --      Peak Flow --      Pain Score 12/20/23 0448 0     Pain Loc --      Pain Education --      Exclude from Growth Chart --     Most recent vital signs: Vitals:   12/20/23 0530 12/20/23 0600  BP: (!) 150/100 139/84  Pulse: (!) 54 100  Resp: 20 19  Temp:    SpO2: 98% 98%    General: Awake, no distress.   CV:  Good peripheral perfusion.  Resp:  Normal effort.  Abd:  No distention.  Other:  She seems to have increased work of breathing, and she has rales at bilateral bases.  She has no significant peripheral edema.  She is hypertensive and tachycardic.  I performed a bedside ultrasound I see a very poor ejection fraction consistent with her prior echo of 30 to 35% and history of CHF.  I see B-lines in all lung fields.   ED Results / Procedures / Treatments   Labs (all labs ordered are listed, but only abnormal results are displayed) Labs Reviewed  BRAIN NATRIURETIC PEPTIDE - Abnormal; Notable for the following components:      Result Value   B Natriuretic Peptide 1,612.1 (*)    All other components within normal limits  COMPREHENSIVE METABOLIC PANEL WITH GFR - Abnormal; Notable for the following components:   Glucose, Bld 137 (*)    All other components within normal limits  TROPONIN I (HIGH SENSITIVITY) - Abnormal; Notable for the following components:  Troponin I (High Sensitivity) 52 (*)    All other components within normal limits  RESP PANEL BY RT-PCR (RSV, FLU A&B, COVID)  RVPGX2  CBC WITH DIFFERENTIAL/PLATELET  BLOOD GAS, VENOUS     I ordered and reviewed the above labs they are notable for BNP markedly elevated 1600, troponin is 50   EKG  ED ECG REPORT I, Buell Carmin, the attending physician, personally viewed and interpreted this ECG.   Date: 12/20/2023  EKG Time: 0458  Rate: 111  Rhythm: sinus tachycardia  Axis: nl  Intervals:nl  ST&T Change: no stemi    RADIOLOGY I independently reviewed and interpreted chest x-ray and see diffuse opacities consistent with pulmonary edema I also reviewed radiologist's formal read.   PROCEDURES:  Critical Care performed: Yes, see critical care procedure note(s)  .Critical Care  Performed by: Buell Carmin, MD Authorized by: Buell Carmin, MD   Critical care provider statement:    Critical care time (minutes):  30    Critical care was time spent personally by me on the following activities:  Development of treatment plan with patient or surrogate, discussions with consultants, evaluation of patient's response to treatment, examination of patient, ordering and review of laboratory studies, ordering and review of radiographic studies, ordering and performing treatments and interventions, pulse oximetry, re-evaluation of patient's condition and review of old charts    MEDICATIONS ORDERED IN ED: Medications  ipratropium-albuterol  (DUONEB) 0.5-2.5 (3) MG/3ML nebulizer solution 6 mL (6 mLs Nebulization Given 12/20/23 0539)  nitroGLYCERIN (NITROGLYN) 2 % ointment 1 inch (1 inch Topical Given 12/20/23 0536)  furosemide  (LASIX ) injection 80 mg (80 mg Intravenous Given 12/20/23 0542)    External physician / consultants:  I spoke with hospital medicine for admission and regarding care plan for this patient.   IMPRESSION / MDM / ASSESSMENT AND PLAN / ED COURSE  I reviewed the triage vital signs and the nursing notes.                                Patient's presentation is most consistent with acute presentation with potential threat to life or bodily function.  Differential diagnosis includes, but is not limited to, CHF exacerbation, COPD exacerbation, respiratory infection, ACS, PE   The patient is on the cardiac monitor to evaluate for evidence of arrhythmia and/or significant heart rate changes.  MDM:    I think she has a CHF exacerbation after using her crack cocaine again.  She has rales on exam and B-lines on ultrasound, markedly elevated BNP, started her on Nitropaste and Lasix .  She remains on oxygen but I will think she needs BiPAP and she does not want BiPAP because it makes her feel very uncomfortable.  I think we can hold off on BiPAP for now.  I do not think COPD exacerbation has no wheezing and no response to her nebulizer treatment.  I counseled her to refrain from further crack cocaine  use.  Considered respiratory infection given her mild cough, but I do not think she has bacterial pneumonia given no focality and no leukocytosis or fever.  Defer antibiotics at this time.  Admission for CHF exacerbation.  FINAL CLINICAL IMPRESSION(S) / ED DIAGNOSES   Final diagnoses:  Acute on chronic systolic congestive heart failure (HCC)  Crack cocaine use     Rx / DC Orders   ED Discharge Orders     None        Note:  This document was prepared using Dragon voice recognition software and may include unintentional dictation errors.    Buell Carmin, MD 12/20/23 [phone]
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
## 2023-12-20 NOTE — Assessment & Plan Note (Signed)
+   cocaine abuse w/ noted secondary acute resp failure on 3-4L [place]  UDS pending  Withdrawal protocol  Follow

## 2023-12-20 NOTE — Assessment & Plan Note (Signed)
 Trop in 50s on presentation  EKG sinus tachycardia  Suspect minimal demand ischemia in setting of acute on chronic HFpEF, acute resp failure w/ hypoxia, cocaine abuse  ASA  Prn NTG Continue to trend troponin

## 2023-12-20 NOTE — Assessment & Plan Note (Addendum)
 Blood sugar 130s SSI Monitor

## 2023-12-20 NOTE — Assessment & Plan Note (Signed)
 BP stable Titrate home regimen
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
## 2023-12-20 NOTE — Assessment & Plan Note (Signed)
 Active tobacco abuse to which patient does not specify Discussed cessation Low-dose nicotine  patch in the interim Monitor

## 2023-12-20 NOTE — Progress Notes (Signed)
 PHARMACIST - PHYSICIAN COMMUNICATION  CONCERNING:  Enoxaparin  (Lovenox ) for DVT Prophylaxis   RECOMMENDATION: Patient was prescribed enoxaprin 40mg  q24 hours for VTE prophylaxis.   Filed Weights   12/20/23 0449  Weight: 32.7 kg (72 lb)   Body mass index is 14.06 kg/m.  Estimated Creatinine Clearance: 35 mL/min (by C-G formula based on SCr of 0.86 mg/dL).  Patient is candidate for enoxaparin  30mg  every 24 hours based on CrCl <88ml/min or Weight <45kg  DESCRIPTION: Pharmacy has adjusted enoxaparin  dose per [facility] policy.  Patient is now receiving enoxaparin  30 mg every 24 hours   Craven Do, PharmD Pharmacy Resident  12/20/2023 7:37 AM

## 2023-12-20 NOTE — Assessment & Plan Note (Signed)
 Mild cough and wheezing w/ overlapping CHF exacerbation in setting of cocaine abuse  IV solumedrol  Duonebs IV azithromycin  Discussed smoking cessation  Monitor

## 2023-12-20 NOTE — ED Triage Notes (Signed)
 Pt to Ed from home via [facility] for SOB.   History of CHF and uses inhaler. EMS gave 2 duo nebs  No O2 use at home on 3l [place] upon arrival  Pt has hypertension and is on medication for it.   20G right forearm  CO2: 33 BP: 161/111 HR: 111 R:20 CBG:122 T: 98.4

## 2023-12-20 NOTE — Assessment & Plan Note (Signed)
 2D ECHO 10/2023 EF 30-35% and grade 1 diastolic dysfunction  Acute decompensated heart failure in setting of cocaine abuse  + vascular congestion and cardiomegaly on CXR  BNP 1612  IV lasix   Monitor volume status  Follow closely

## 2023-12-20 NOTE — H&P (Addendum)
 History and Physical    Patient: Penny Gill NWG:[id number] DOB: 1961/03/25 DOA: 12/20/2023 DOS: the patient was seen and examined on 12/20/2023 PCP: System, Provider Not In  Patient coming from: Home  Chief Complaint:  Chief Complaint  Patient presents with   Shortness of Breath   HPI: Penny Gill is a 63 y.o. female with medical history significant of chronic HFrEF, COPD, cocaine abuse, hyperlipidemia, hypertension presented with acute respiratory failure with hypoxia, acute on chronic HFrEF, COPD exacerbation.  Patient reports increased work of breathing over the past 2 to 3 days.  Positive cough and wheezing.  Also with orthopnea, PND.  Noted to have been admitted March 2025 for similar issues with new diagnosis of HFrEF associated with cocaine abuse.  Patient reports compliance with diuretic regimen at home.  Denies any high salt intake or recent NSAID use.  Patient states she smoked crack roughly 2 days ago.  Developed acute onset of shortness of breath, increased work of breathing over the past 12 to 24 hours.  No abdominal pain or diarrhea.  Positive tobacco use.  No reported alcohol use.  Denies any other illicit drug use.  Positive significant wheezing. Presented to the ER afebrile, hemodynamically stable.  Is due for follow-up with outpatient cardiology in the [facility].  Requiring 4 L nasal cannula to keep O2 sats greater than 96%.  White count 6.8, hemoglobin 14, platelets 356, troponin in the 50s, VBG stable.  COVID flu and RSV negative.  BNP of 1612.  Creatinine 0.86.  Glucose 137.  EKG sinus tachycardia.  Troponin bilateral pulmonary opacities likely edema with emphysema. Review of Systems: As mentioned in the history of present illness. All other systems reviewed and are negative. Past Medical History:  Diagnosis Date   COPD (chronic obstructive pulmonary disease) (HCC)    emphysema   Depression    Hyperlipidemia    Hypertension    Kidney stone    Osteoporosis     Sickle cell trait (HCC)    Past Surgical History:  Procedure Laterality Date   TUBAL LIGATION     Social History:  reports that she has been smoking cigarettes and e-cigarettes. She has never used smokeless tobacco. She reports current alcohol use. She reports that she does not currently use drugs after having used the following drugs: Marijuana and Cocaine.  No Known Allergies  Family History  Problem Relation Age of Onset   Breast cancer Maternal Aunt 40    Prior to Admission medications   Medication Sig Start Date End Date Taking? Authorizing Provider  aspirin  EC 81 MG tablet Take 1 tablet (81 mg total) by mouth daily. Swallow whole. 11/18/23   Montey Apa, DO  atorvastatin  (LIPITOR) 40 MG tablet Take 1 tablet (40 mg total) by mouth at bedtime. 11/17/23   Darus Engels A, DO  diclofenac  Sodium (VOLTAREN ) 1 % GEL Apply 2 g topically 4 (four) times daily. 11/02/23 11/01/24  [provider]  DULoxetine (CYMBALTA) 30 MG capsule Take 1 capsule by mouth daily. 11/02/23 11/01/24  [provider]  feeding supplement (ENSURE ENLIVE / ENSURE PLUS) LIQD Take 237 mLs by mouth 2 (two) times daily between meals. 11/17/23   Montey Apa, DO  furosemide  (LASIX ) 20 MG tablet Take 1 tablet (20 mg total) by mouth daily. 11/18/23   Montey Apa, DO  gabapentin  (NEURONTIN ) 300 MG capsule Take 300 mg by mouth 4 (four) times daily.    [provider]  hydrocortisone  (PROCTO-MED HC ) 2.5 %  rectal cream Place 1 Application rectally as directed.    [provider]  Prenatal Vit-Fe Fumarate-FA (PREPLUS) 27-1 MG TABS Take 1 tablet by mouth daily. Patient not taking: Reported on 11/15/2023 09/30/17   [provider]  spironolactone  (ALDACTONE ) 25 MG tablet Take 0.5 tablets (12.5 mg total) by mouth daily. 11/18/23   Montey Apa, DO  tiZANidine (ZANAFLEX) 2 MG tablet Take 2 mg by mouth every 8 (eight) hours as needed.    [provider]    Physical  Exam: Vitals:   12/20/23 0600 12/20/23 0630 12/20/23 0700 12/20/23 0715  BP: 139/84 132/85 124/84   Pulse: 100 (!) 53 (!) 55 (!) 54  Resp: 19 17 20 16   Temp:      TempSrc:      SpO2: 98% 100% 100% 100%  Weight:      Height:       Physical Exam Constitutional:      Comments: Underweight, cachectic    HENT:     Head: Normocephalic and atraumatic.     Nose: Nose normal.     Mouth/Throat:     Mouth: Mucous membranes are moist.  Eyes:     Pupils: Pupils are equal, round, and reactive to light.  Neck:     Comments: + JVD   Cardiovascular:     Rate and Rhythm: Normal rate and regular rhythm.  Pulmonary:     Effort: Pulmonary effort is normal.     Breath sounds: Wheezing and rales present.  Abdominal:     General: Bowel sounds are normal.  Musculoskeletal:        General: Normal range of motion.  Skin:    General: Skin is warm.  Neurological:     General: No focal deficit present.  Psychiatric:        Mood and Affect: Mood normal.     Data Reviewed:  There are no new results to review at this time.  DG Chest Port 1 View CLINICAL DATA:  Cough and shortness of breath  EXAM: PORTABLE CHEST 1 VIEW  COMPARISON:  11/14/2023 chest CT  FINDINGS: Chronic lung disease with generalized interstitial coarsening and hazy density. Some Kerley lines are present and there is chronic cardiomegaly. Negative aortic and hilar contours. No pleural fluid or pneumothorax.  IMPRESSION: Bilateral pulmonary opacity, likely edema superimposed on emphysema, similar to studies from last month.  Electronically Signed   By: Ronnette Coke M.D.   On: 12/20/2023 05:43  Lab Results  Component Value Date   WBC 6.8 12/20/2023   HGB 14.0 12/20/2023   HCT 42.4 12/20/2023   MCV 91.0 12/20/2023   PLT 356 12/20/2023   Last metabolic panel Lab Results  Component Value Date   GLUCOSE 137 (H) 12/20/2023   NA 140 12/20/2023   K 3.6 12/20/2023   CL 103 12/20/2023   CO2 26 12/20/2023    BUN 16 12/20/2023   CREATININE 0.86 12/20/2023   GFRNONAA >60 12/20/2023   CALCIUM  9.2 12/20/2023   PROT 7.4 12/20/2023   ALBUMIN 3.9 12/20/2023   BILITOT 0.7 12/20/2023   ALKPHOS 91 12/20/2023   AST 36 12/20/2023   ALT 33 12/20/2023   ANIONGAP 11 12/20/2023    Assessment and Plan: Acute respiratory failure with hypoxia (HCC) Decompensated resp status now requiring 3-4L  in setting of acute on chronic HFrEF, COPD,cocaine abuse  BNP 1612 CXR + volume overload  No overt infection noted at present  Mild wheezing concerning for COPD flare  IV  lasix   IV solumedrol  Duonebs  Monitor resp status with treatment    Acute on chronic HFrEF (heart failure with reduced ejection fraction) (HCC) 2D ECHO 10/2023 EF 30-35% and grade 1 diastolic dysfunction  Acute decompensated heart failure in setting of cocaine abuse  + vascular congestion and cardiomegaly on CXR  BNP 1612  IV lasix   Monitor volume status  Follow closely   COPD (chronic obstructive pulmonary disease) (HCC) Mild cough and wheezing w/ overlapping CHF exacerbation in setting of cocaine abuse  IV solumedrol  Duonebs IV azithromycin  Discussed smoking cessation  Monitor    Elevated troponin Trop in 50s on presentation  EKG sinus tachycardia  Suspect minimal demand ischemia in setting of acute on chronic HFpEF, acute resp failure w/ hypoxia, cocaine abuse  ASA  Prn NTG Continue to trend troponin    Hyperlipemia Continue Lipitor  Hypertension BP stable  Titrate home regimen    Cocaine abuse (HCC) + cocaine abuse w/ noted secondary acute resp failure on 3-4L [place]  UDS pending  Withdrawal protocol  Follow   Tobacco dependence Active tobacco abuse to which patient does not specify Discussed cessation Low-dose nicotine  patch in the interim Monitor  Prediabetes Blood sugar 130s SSI  Monitor        Advance Care Planning:   Code Status: Full Code   Consults: None at present- consider cardiology  consultation as clinically appropriate   Family Communication: Family at the bedside   Severity of Illness: The appropriate patient status for this patient is OBSERVATION. Observation status is judged to be reasonable and necessary in order to provide the required intensity of service to ensure the patient's safety. The patient's presenting symptoms, physical exam findings, and initial radiographic and laboratory data in the context of their medical condition is felt to place them at decreased risk for further clinical deterioration. Furthermore, it is anticipated that the patient will be medically stable for discharge from the hospital within 2 midnights of admission.   Author: Corrinne Din, MD 12/20/2023 7:46 AM  For on call review [email or web address].

## 2023-12-20 NOTE — Assessment & Plan Note (Signed)
 Decompensated resp status now requiring 3-4L [place] in setting of acute on chronic HFrEF, COPD,cocaine abuse  BNP 1612 CXR + volume overload  No overt infection noted at present  Mild wheezing concerning for COPD flare  IV lasix   IV solumedrol  Duonebs  Monitor resp status with treatment

## 2023-12-21 ENCOUNTER — Other Ambulatory Visit (HOSPITAL_COMMUNITY): Payer: Self-pay

## 2023-12-21 ENCOUNTER — Telehealth (HOSPITAL_COMMUNITY): Payer: Self-pay

## 2023-12-21 DIAGNOSIS — J439 Emphysema, unspecified: Secondary | ICD-10-CM | POA: Diagnosis present

## 2023-12-21 DIAGNOSIS — Z681 Body mass index (BMI) 19 or less, adult: Secondary | ICD-10-CM | POA: Diagnosis not present

## 2023-12-21 DIAGNOSIS — Z716 Tobacco abuse counseling: Secondary | ICD-10-CM | POA: Diagnosis not present

## 2023-12-21 DIAGNOSIS — J441 Chronic obstructive pulmonary disease with (acute) exacerbation: Secondary | ICD-10-CM | POA: Diagnosis present

## 2023-12-21 DIAGNOSIS — E1165 Type 2 diabetes mellitus with hyperglycemia: Secondary | ICD-10-CM | POA: Diagnosis present

## 2023-12-21 DIAGNOSIS — F1490 Cocaine use, unspecified, uncomplicated: Secondary | ICD-10-CM | POA: Diagnosis not present

## 2023-12-21 DIAGNOSIS — I2489 Other forms of acute ischemic heart disease: Secondary | ICD-10-CM | POA: Diagnosis present

## 2023-12-21 DIAGNOSIS — I427 Cardiomyopathy due to drug and external agent: Secondary | ICD-10-CM | POA: Diagnosis present

## 2023-12-21 DIAGNOSIS — I493 Ventricular premature depolarization: Secondary | ICD-10-CM | POA: Diagnosis not present

## 2023-12-21 DIAGNOSIS — D573 Sickle-cell trait: Secondary | ICD-10-CM | POA: Diagnosis present

## 2023-12-21 DIAGNOSIS — Z79899 Other long term (current) drug therapy: Secondary | ICD-10-CM | POA: Diagnosis not present

## 2023-12-21 DIAGNOSIS — J9601 Acute respiratory failure with hypoxia: Secondary | ICD-10-CM | POA: Diagnosis present

## 2023-12-21 DIAGNOSIS — I10 Essential (primary) hypertension: Secondary | ICD-10-CM | POA: Diagnosis not present

## 2023-12-21 DIAGNOSIS — R0602 Shortness of breath: Secondary | ICD-10-CM | POA: Diagnosis present

## 2023-12-21 DIAGNOSIS — F32A Depression, unspecified: Secondary | ICD-10-CM | POA: Diagnosis present

## 2023-12-21 DIAGNOSIS — F17210 Nicotine dependence, cigarettes, uncomplicated: Secondary | ICD-10-CM | POA: Diagnosis present

## 2023-12-21 DIAGNOSIS — Z87442 Personal history of urinary calculi: Secondary | ICD-10-CM | POA: Diagnosis not present

## 2023-12-21 DIAGNOSIS — R7989 Other specified abnormal findings of blood chemistry: Secondary | ICD-10-CM | POA: Diagnosis not present

## 2023-12-21 DIAGNOSIS — Z5320 Procedure and treatment not carried out because of patient's decision for unspecified reasons: Secondary | ICD-10-CM | POA: Diagnosis not present

## 2023-12-21 DIAGNOSIS — I509 Heart failure, unspecified: Secondary | ICD-10-CM | POA: Diagnosis not present

## 2023-12-21 DIAGNOSIS — Z87440 Personal history of urinary (tract) infections: Secondary | ICD-10-CM | POA: Diagnosis not present

## 2023-12-21 DIAGNOSIS — I5023 Acute on chronic systolic (congestive) heart failure: Secondary | ICD-10-CM | POA: Diagnosis present

## 2023-12-21 DIAGNOSIS — R011 Cardiac murmur, unspecified: Secondary | ICD-10-CM | POA: Diagnosis present

## 2023-12-21 DIAGNOSIS — E785 Hyperlipidemia, unspecified: Secondary | ICD-10-CM | POA: Diagnosis present

## 2023-12-21 DIAGNOSIS — I110 Hypertensive heart disease with heart failure: Secondary | ICD-10-CM | POA: Diagnosis present

## 2023-12-21 DIAGNOSIS — F1410 Cocaine abuse, uncomplicated: Secondary | ICD-10-CM | POA: Diagnosis present

## 2023-12-21 DIAGNOSIS — R64 Cachexia: Secondary | ICD-10-CM | POA: Diagnosis present

## 2023-12-21 DIAGNOSIS — Z1152 Encounter for screening for COVID-19: Secondary | ICD-10-CM | POA: Diagnosis not present

## 2023-12-21 DIAGNOSIS — Z720 Tobacco use: Secondary | ICD-10-CM | POA: Diagnosis not present

## 2023-12-21 DIAGNOSIS — M810 Age-related osteoporosis without current pathological fracture: Secondary | ICD-10-CM | POA: Diagnosis present

## 2023-12-21 LAB — CBC
HCT: 36.2 % (ref 36.0–46.0)
Hemoglobin: 12.5 g/dL (ref 12.0–15.0)
MCH: 30 pg (ref 26.0–34.0)
MCHC: 34.5 g/dL (ref 30.0–36.0)
MCV: 87 fL (ref 80.0–100.0)
Platelets: 349 10*3/uL (ref 150–400)
RBC: 4.16 MIL/uL (ref 3.87–5.11)
RDW: 11.9 % (ref 11.5–15.5)
WBC: 9.7 10*3/uL (ref 4.0–10.5)
nRBC: 0 % (ref 0.0–0.2)

## 2023-12-21 LAB — COMPREHENSIVE METABOLIC PANEL WITH GFR
ALT: 21 U/L (ref 0–44)
AST: 21 U/L (ref 15–41)
Albumin: 3.3 g/dL — ABNORMAL LOW (ref 3.5–5.0)
Alkaline Phosphatase: 71 U/L (ref 38–126)
Anion gap: 9 (ref 5–15)
BUN: 30 mg/dL — ABNORMAL HIGH (ref 8–23)
CO2: 24 mmol/L (ref 22–32)
Calcium: 9 mg/dL (ref 8.9–10.3)
Chloride: 103 mmol/L (ref 98–111)
Creatinine, Ser: 0.85 mg/dL (ref 0.44–1.00)
GFR, Estimated: >60 mL/min (ref >60)
Glucose, Bld: 229 mg/dL — ABNORMAL HIGH (ref 70–99)
Potassium: 3.8 mmol/L (ref 3.5–5.1)
Sodium: 136 mmol/L (ref 135–145)
Total Bilirubin: 0.8 mg/dL (ref 0.0–1.2)
Total Protein: 6.6 g/dL (ref 6.5–8.1)

## 2023-12-21 LAB — GLUCOSE, CAPILLARY
Glucose-Capillary: 141 mg/dL — ABNORMAL HIGH (ref 70–99)
Glucose-Capillary: 146 mg/dL — ABNORMAL HIGH (ref 70–99)
Glucose-Capillary: 212 mg/dL — ABNORMAL HIGH (ref 70–99)
Glucose-Capillary: 183 mg/dL — ABNORMAL HIGH (ref 70–99)

## 2023-12-21 MED ORDER — DAPAGLIFLOZIN PROPANEDIOL 10 MG PO TABS
10.0000 mg | ORAL_TABLET | Freq: Every day | ORAL | Status: DC
  Administered 2023-12-21 – 2023-12-23 (×2): 10 mg via ORAL
  Filled 2023-12-21 (×3): qty 1

## 2023-12-21 MED ORDER — SODIUM CHLORIDE 0.9 % IV SOLN: INTRAVENOUS | Status: DC

## 2023-12-21 MED ORDER — CARVEDILOL 3.125 MG PO TABS: 3.1250 mg | ORAL_TABLET | Freq: Two times a day (BID) | ORAL | Status: DC

## 2023-12-21 MED ORDER — CARVEDILOL 3.125 MG PO TABS
3.1250 mg | ORAL_TABLET | Freq: Two times a day (BID) | ORAL | Status: DC
  Administered 2023-12-21 – 2023-12-23 (×4): 3.125 mg via ORAL
  Filled 2023-12-21 (×4): qty 1

## 2023-12-21 MED ORDER — ASPIRIN 81 MG PO CHEW
81.0000 mg | CHEWABLE_TABLET | ORAL | Status: AC
  Administered 2023-12-22: 81 mg via ORAL
  Filled 2023-12-21: qty 1

## 2023-12-21 MED ORDER — FUROSEMIDE 20 MG PO TABS: 20.0000 mg | ORAL_TABLET | Freq: Every day | ORAL | Status: DC

## 2023-12-21 NOTE — Telephone Encounter (Signed)
 Pharmacy Patient Advocate Encounter  Received notification from [place] [employer] that Prior Authorization for Farxiga 10MG  tablets has been APPROVED from 12/21/2023 to 12/20/2024. Ran test claim, Copay is $4.00. This test claim was processed through [facility]- copay amounts may vary at other pharmacies due to pharmacy/plan contracts, or as the patient moves through the different stages of their insurance plan.   PA #/Case ID/Reference #: [id number]
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
## 2023-12-21 NOTE — Telephone Encounter (Signed)
 Pharmacy Patient Advocate Encounter  Insurance verification completed.    The patient is insured through [employer] [place].     Ran test claim for Penny Gill and the current 30 day co-pay is PA required.  Ran test claim for Jardiance and the current 30 day co-pay is PA required.  This test claim was processed through [facility]- copay amounts may vary at other pharmacies due to pharmacy/plan contracts, or as the patient moves through the different stages of their insurance plan.
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
## 2023-12-21 NOTE — Progress Notes (Signed)
 Initial Nutrition Assessment  DOCUMENTATION CODES:   Severe malnutrition in context of chronic illness  INTERVENTION:   Ensure Enlive po BID, each supplement provides 350 kcal and 20 grams of protein.  MVI, folic acid and thiamine po daily   Liberalize diet   Pt at high refeed risk; recommend monitor potassium, magnesium  and phosphorus labs daily until stable  Daily weights   Check B12 lab  NUTRITION DIAGNOSIS:   Severe Malnutrition related to chronic illness (COPD, substance abuse) as evidenced by severe fat depletion, severe muscle depletion, 10 percent weight loss in 2 months.  GOAL:   Patient will meet greater than or equal to 90% of their needs  MONITOR:   PO intake, Supplement acceptance, Labs, Weight trends, I & O's, Skin  REASON FOR ASSESSMENT:   Consult Assessment of nutrition requirement/status  ASSESSMENT:   63 y.o. female with a h/o cocaine and tobacco abuse, neuropathy, hypertension, hyperlipidemia, diabetes, COPD not on O2, depression, anxiety, chronic pain, kidney stones and sickle cell trait who is admitted with CHF.  Met with pt in room today. Pt reports decreased appetite and oral intake at baseline. Pt reports that she has lost down from 100lbs over the past 3-4 years secondary to her drug use. Per chart, pt is down ~8lbs(10%) over the past 2 months; this is significant weight loss. Pt reports that she does drink vanilla Ensure at home but not regularly (coupons provided today). Pt reports fair oral intake in hospital; pt documented to be eating 60-90% of meals. Pt ate 30% of her lunch tray today but reports that she did not like the food that was sent. Pt has ordered pizza for dinner. Recommend continue Ensure. Pt is likely at refeed risk. Pt reports ongoing, significant neuropathy; will check B12 lab.   Medications reviewed and include: aspirin , lovenox , folic acid, lasix , insulin , MVI, nicotine , prednisone , aldactone , thiamine   Labs reviewed: K 3.8  wnl, BUN 30(H) BNP- 1612.1(H)- 4/27 Cbgs- 146, 141 x 24 hrs  AIC 5.8(H)- 3/23  NUTRITION - FOCUSED PHYSICAL EXAM:  Flowsheet Row Most Recent Value  Orbital Region Severe depletion  Upper Arm Region Severe depletion  Thoracic and Lumbar Region Severe depletion  Buccal Region Severe depletion  Temple Region Severe depletion  Clavicle Bone Region Severe depletion  Clavicle and Acromion Bone Region Severe depletion  Scapular Bone Region Severe depletion  Dorsal Hand Severe depletion  Patellar Region Severe depletion  Anterior Thigh Region Severe depletion  Posterior Calf Region Severe depletion  Edema (RD Assessment) Mild  Hair Reviewed  Eyes Reviewed  Mouth Reviewed  Skin Reviewed  Nails Reviewed   Diet Order:   Diet Order             Diet Carb Modified Fluid consistency: Thin; Room service appropriate? Yes; Fluid restriction: 1200 mL Fluid  Diet effective now                  EDUCATION NEEDS:   Education needs have been addressed  Skin:  Skin Assessment: Reviewed RN Assessment  Last BM:  4/26  Height:   Ht Readings from Last 1 Encounters:  12/20/23 5' (1.524 m)    Weight:   Wt Readings from Last 1 Encounters:  12/21/23 33.4 kg    Ideal Body Weight:  45.4 kg  BMI:  Body mass index is 14.37 kg/m.  Estimated Nutritional Needs:   Kcal:  1200-1400kcal/day  Protein:  60-70g/day  Fluid:  1.0-1.2L/day  Torrance Freestone MS, RD, LDN If unable to  be reached, please send secure chat to "RD inpatient" available from 8:00a-4:00p daily

## 2023-12-21 NOTE — Care Management Obs Status (Signed)
 MEDICARE OBSERVATION STATUS NOTIFICATION   Patient Details  Name: Penny Gill MRN: [id number] Date of Birth: 1960-11-19   Medicare Observation Status Notification Given:  Yes    [name] 12/21/2023, 12:09 PM

## 2023-12-21 NOTE — Inpatient Diabetes Management (Signed)
 Inpatient Diabetes Program Recommendations  AACE/ADA: New Consensus Statement on Inpatient Glycemic Control (2015)  Target Ranges:  Prepandial:   less than 140 mg/dL      Peak postprandial:   less than 180 mg/dL (1-2 hours)      Critically ill patients:  140 - 180 mg/dL   Lab Results  Component Value Date   GLUCAP 212 (H) 12/21/2023   HGBA1C 5.8 (H) 11/15/2023    Review of Glycemic Control  Diabetes history: DM2 Outpatient Diabetes medications: None Current orders for Inpatient glycemic control: Novolog  0-9 TID, Farxiga 10 daily On Prednisone  40 mg daily  Inpatient Diabetes Program Recommendations:    Consider adding Novolog  2 units TID for meal coverage if pt eating > 50%  Will follow glucose trends.  Thank you. Joni Net, RD, LDN, CDCES Inpatient Diabetes Coordinator [phone]

## 2023-12-21 NOTE — Progress Notes (Signed)
 Progress Note   Patient: Penny Gill:[id number] DOB: [birth year] DOA: 12/20/2023     0 DOS: the patient was seen and examined on 12/21/2023   Brief hospital course: From HPI "Penny Gill is a 63 y.o. female with medical history significant of chronic HFrEF, COPD, cocaine abuse, hyperlipidemia, hypertension presented with acute respiratory failure with hypoxia, acute on chronic HFrEF, COPD exacerbation.  Patient reports increased work of breathing over the past 2 to 3 days.  Positive cough and wheezing.  Also with orthopnea, PND.  Noted to have been admitted March 2025 for similar issues with new diagnosis of HFrEF associated with cocaine abuse.  Patient reports compliance with diuretic regimen at home.  Denies any high salt intake or recent NSAID use.  Patient states she smoked crack roughly 2 days ago.  Developed acute onset of shortness of breath, increased work of breathing over the past 12 to 24 hours.  No abdominal pain or diarrhea.  Positive tobacco use.  No reported alcohol use.  Denies any other illicit drug use.  Positive significant wheezing. Presented to the ER afebrile, hemodynamically stable.  Is due for follow-up with outpatient cardiology in the [facility] system.  Requiring 4 L nasal cannula to keep O2 sats greater than 96%.  White count 6.8, hemoglobin 14, platelets 356, troponin in the 50s, VBG stable.  COVID flu and RSV negative.  BNP of 1612.  Creatinine 0.86.  Glucose 137.  EKG sinus tachycardia.    Assessment and Plan:  Acute respiratory failure with hypoxia (HCC) Decompensated resp status now requiring 3-4L [place] in setting of acute on chronic HFrEF, COPD,cocaine abuse  BNP 1612 Chest x-ray showing findings of fluid overload Continue  diuresis Monitor input and output     Acute on chronic HFrEF (heart failure with reduced ejection fraction) (HCC) 2D ECHO 10/2023 EF 30-35% and grade 1 diastolic dysfunction  Acute decompensated heart failure in setting of cocaine abuse  +  vascular congestion and cardiomegaly on CXR  BNP 1612  I have consulted cardiologist given new findings of reduced EF with no formal cardiology eval in the past Continue monitoring input and output Daily weighing Continue diuresis     COPD (chronic obstructive pulmonary disease) (HCC) Mild cough and wheezing w/ overlapping CHF exacerbation in setting of cocaine abuse  Continue steroid therapy Continue nebulization Continue azithromycin     Elevated troponin Trop in 50s on presentation  Cardiologist on board This is likely secondary to cocaine use     Hyperlipemia Continue Lipitor   Hypertension BP stable  Titrate home regimen      Cocaine abuse (HCC) + cocaine abuse w/ noted secondary acute resp failure on 3-4L [place]  UDS showing cocaine Patient has been counseled on abstinence   Tobacco dependence Active tobacco abuse to which patient does not specify Discussed cessation Low-dose nicotine  patch in the interim Monitor   Prediabetes Monitor glucose level Currently on sliding scale insulin  therapy    Advance Care Planning:   Code Status: Full Code    Consults: None at present- consider cardiology consultation as clinically appropriate    Family Communication: Family at the bedside    Subjective:  Patient seen and examined at bedside this morning in the presence of the family She admits to improvement in respiratory function She continues to have some expiratory wheezes Denies nausea vomiting abdominal pain chest pain or cough She continues to have exertional dyspnea Review of echo showing EF of 30 to 35% I have consulted cardiology  Physical Exam:  HENT:     Head: Normocephalic and atraumatic.     Nose: Nose normal.     Mouth/Throat:     Mouth: Mucous membranes are moist.  Eyes:     Pupils: Pupils are equal, round, and reactive to light.  Neck:     Comments: + JVD    Cardiovascular:     Rate and Rhythm: Normal rate and regular rhythm.  Pulmonary:      Effort: Pulmonary effort is normal.  Expiratory wheezing noted Abdominal:     General: Bowel sounds are normal.  Musculoskeletal:        General: Normal range of motion.  Skin:    General: Skin is warm.  Neurological:     General: No focal deficit present.  Psychiatric:        Mood and Affect: Mood normal  Vitals:   12/20/23 2000 12/21/23 0324 12/21/23 0808 12/21/23 1205  BP: 114/77 131/82 (!) 131/99 129/83  Pulse:   91 (!) 55  Resp: 18 18 18 18   Temp: 98.1 F (36.7 C) 98 F (36.7 C) 97.8 F (36.6 C) 97.8 F (36.6 C)  TempSrc: Oral Oral    SpO2: 98% 100% 100% 98%  Weight:  33.4 kg    Height:        Data Reviewed: Chest x-ray reviewed showing bilateral pulmonary edema    Latest Ref Rng & Units 12/21/2023    4:50 AM 12/20/2023    5:13 AM 11/14/2023    6:33 PM  CBC  WBC 4.0 - 10.5 K/uL 9.7  6.8  6.8   Hemoglobin 12.0 - 15.0 g/dL 16.1  09.6  04.5   Hematocrit 36.0 - 46.0 % 36.2  42.4  37.4   Platelets 150 - 400 K/uL 349  356  329        Latest Ref Rng & Units 12/21/2023    4:50 AM 12/20/2023    5:13 AM 11/17/2023    4:12 AM  BMP  Glucose 70 - 99 mg/dL 409  811  914   BUN 8 - 23 mg/dL 30  16  46   Creatinine 0.44 - 1.00 mg/dL 7.82  9.56  2.13   Sodium 135 - 145 mmol/L 136  140  137   Potassium 3.5 - 5.1 mmol/L 3.8  3.6  4.0   Chloride 98 - 111 mmol/L 103  103  99   CO2 22 - 32 mmol/L 24  26  28    Calcium  8.9 - 10.3 mg/dL 9.0  9.2  9.1     Time spent: 56 minutes  Author: Ezzard Holms, MD 12/21/2023 3:55 PM  For on call review [email or web address].
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
## 2023-12-21 NOTE — Telephone Encounter (Signed)
 Pharmacy Patient Advocate Encounter   Received notification from Inpatient Request that prior authorization for Farxiga is required/requested.   Insurance verification completed.   The patient is insured through [employer] [place] .   Per test claim: PA required; PA submitted to above mentioned insurance via CoverMyMeds Key/confirmation #/EOC [id number] Status is pending

## 2023-12-22 ENCOUNTER — Encounter: Payer: Self-pay | Admitting: Internal Medicine

## 2023-12-22 ENCOUNTER — Encounter
Admission: EM | Disposition: A | Discharge status: Home Health | Payer: Self-pay | Source: Home / Self Care | Attending: Internal Medicine

## 2023-12-22 DIAGNOSIS — I5023 Acute on chronic systolic (congestive) heart failure: Secondary | ICD-10-CM | POA: Diagnosis not present

## 2023-12-22 DIAGNOSIS — Z720 Tobacco use: Secondary | ICD-10-CM

## 2023-12-22 LAB — POCT I-STAT EG7
Acid-Base Excess: 4 mmol/L — ABNORMAL HIGH (ref 0.0–2.0)
Bicarbonate: 30.6 mmol/L — ABNORMAL HIGH (ref 20.0–28.0)
Calcium, Ion: 1.19 mmol/L (ref 1.15–1.40)
HCT: 40 % (ref 36.0–46.0)
Hemoglobin: 13.6 g/dL (ref 12.0–15.0)
O2 Saturation: 64 %
Potassium: 4 mmol/L (ref 3.5–5.1)
Sodium: 140 mmol/L (ref 135–145)
TCO2: 32 mmol/L (ref 22–32)
pCO2, Ven: 50.5 mmHg (ref 44–60)
pH, Ven: 7.39 (ref 7.25–7.43)
pO2, Ven: 34 mmHg (ref 32–45)

## 2023-12-22 LAB — POCT I-STAT 7, (LYTES, BLD GAS, ICA,H+H)
Acid-Base Excess: 4 mmol/L — ABNORMAL HIGH (ref 0.0–2.0)
Bicarbonate: 28.7 mmol/L — ABNORMAL HIGH (ref 20.0–28.0)
Calcium, Ion: 1.2 mmol/L (ref 1.15–1.40)
HCT: 40 % (ref 36.0–46.0)
Hemoglobin: 13.6 g/dL (ref 12.0–15.0)
O2 Saturation: 99 %
Potassium: 4 mmol/L (ref 3.5–5.1)
Sodium: 139 mmol/L (ref 135–145)
TCO2: 30 mmol/L (ref 22–32)
pCO2 arterial: 44.4 mmHg (ref 32–48)
pH, Arterial: 7.419 (ref 7.35–7.45)
pO2, Arterial: 123 mmHg — ABNORMAL HIGH (ref 83–108)

## 2023-12-22 LAB — PHOSPHORUS: Phosphorus: 3.3 mg/dL (ref 2.5–4.6)

## 2023-12-22 LAB — GLUCOSE, CAPILLARY
Glucose-Capillary: 93 mg/dL (ref 70–99)
Glucose-Capillary: 212 mg/dL — ABNORMAL HIGH (ref 70–99)
Glucose-Capillary: 183 mg/dL — ABNORMAL HIGH (ref 70–99)

## 2023-12-22 LAB — CBC WITH DIFFERENTIAL/PLATELET
Abs Immature Granulocytes: 0.04 10*3/uL (ref 0.00–0.07)
Basophils Absolute: 0 10*3/uL (ref 0.0–0.1)
Basophils Relative: 0 %
Eosinophils Absolute: 0 10*3/uL (ref 0.0–0.5)
Eosinophils Relative: 0 %
HCT: 38.9 % (ref 36.0–46.0)
Hemoglobin: 13.3 g/dL (ref 12.0–15.0)
Immature Granulocytes: 0 %
Lymphocytes Relative: 20 %
Lymphs Abs: 2.7 10*3/uL (ref 0.7–4.0)
MCH: 30.2 pg (ref 26.0–34.0)
MCHC: 34.2 g/dL (ref 30.0–36.0)
MCV: 88.2 fL (ref 80.0–100.0)
Monocytes Absolute: 0.8 10*3/uL (ref 0.1–1.0)
Monocytes Relative: 6 %
Neutro Abs: 10.4 10*3/uL — ABNORMAL HIGH (ref 1.7–7.7)
Neutrophils Relative %: 74 %
Platelets: 398 10*3/uL (ref 150–400)
RBC: 4.41 MIL/uL (ref 3.87–5.11)
RDW: 11.9 % (ref 11.5–15.5)
WBC: 14 10*3/uL — ABNORMAL HIGH (ref 4.0–10.5)
nRBC: 0 % (ref 0.0–0.2)

## 2023-12-22 LAB — VITAMIN B12: Vitamin B-12: 452 pg/mL (ref 180–914)

## 2023-12-22 LAB — BASIC METABOLIC PANEL WITH GFR
Anion gap: 7 (ref 5–15)
BUN: 43 mg/dL — ABNORMAL HIGH (ref 8–23)
CO2: 27 mmol/L (ref 22–32)
Calcium: 9 mg/dL (ref 8.9–10.3)
Chloride: 101 mmol/L (ref 98–111)
Creatinine, Ser: 0.93 mg/dL (ref 0.44–1.00)
GFR, Estimated: >60 mL/min (ref >60)
Glucose, Bld: 95 mg/dL (ref 70–99)
Potassium: 3.8 mmol/L (ref 3.5–5.1)
Sodium: 135 mmol/L (ref 135–145)

## 2023-12-22 LAB — MAGNESIUM: Magnesium: 2.1 mg/dL (ref 1.7–2.4)

## 2023-12-22 SURGERY — RIGHT/LEFT HEART CATH AND CORONARY ANGIOGRAPHY
Anesthesia: Moderate Sedation

## 2023-12-22 MED ORDER — LIDOCAINE HCL 1 % IJ SOLN
INTRAMUSCULAR | Status: AC
Start: 2023-12-22 — End: ?
  Filled 2023-12-22: qty 20

## 2023-12-22 MED ORDER — FENTANYL CITRATE (PF) 100 MCG/2ML IJ SOLN
INTRAMUSCULAR | Status: DC | PRN
  Administered 2023-12-22: 12.5 ug via INTRAVENOUS

## 2023-12-22 MED ORDER — HEPARIN SODIUM (PORCINE) 1000 UNIT/ML IJ SOLN
INTRAMUSCULAR | Status: AC
  Filled 2023-12-22: qty 10

## 2023-12-22 MED ORDER — VERAPAMIL HCL 2.5 MG/ML IV SOLN
INTRAVENOUS | Status: AC
Start: 2023-12-22 — End: ?
  Filled 2023-12-22: qty 2

## 2023-12-22 MED ORDER — HEPARIN (PORCINE) IN NACL 1000-0.9 UT/500ML-% IV SOLN
INTRAVENOUS | Status: AC
  Filled 2023-12-22: qty 1000

## 2023-12-22 MED ORDER — MIDAZOLAM HCL 2 MG/2ML IJ SOLN
INTRAMUSCULAR | Status: DC | PRN
  Administered 2023-12-22: .5 mg via INTRAVENOUS

## 2023-12-22 MED ORDER — LIDOCAINE HCL (PF) 1 % IJ SOLN
INTRAMUSCULAR | Status: DC | PRN
  Administered 2023-12-22: 2 mL
  Administered 2023-12-22: 5 mL

## 2023-12-22 MED ORDER — HYDRALAZINE HCL 20 MG/ML IJ SOLN: 10.0000 mg | INTRAMUSCULAR | Status: AC | PRN

## 2023-12-22 MED ORDER — HEPARIN (PORCINE) IN NACL 2000-0.9 UNIT/L-% IV SOLN
INTRAVENOUS | Status: DC | PRN
  Administered 2023-12-22: 1000 mL

## 2023-12-22 MED ORDER — SODIUM CHLORIDE 0.9 % IV SOLN: 250.0000 mL | INTRAVENOUS | Status: DC | PRN

## 2023-12-22 MED ORDER — FENTANYL CITRATE (PF) 100 MCG/2ML IJ SOLN
INTRAMUSCULAR | Status: AC
  Filled 2023-12-22: qty 2

## 2023-12-22 MED ORDER — VERAPAMIL HCL 2.5 MG/ML IV SOLN
INTRAVENOUS | Status: DC | PRN
  Administered 2023-12-22 (×2): 2.5 mg via INTRA_ARTERIAL

## 2023-12-22 MED ORDER — LOSARTAN POTASSIUM 25 MG PO TABS
12.5000 mg | ORAL_TABLET | Freq: Every day | ORAL | Status: DC
  Administered 2023-12-22 – 2023-12-23 (×2): 12.5 mg via ORAL
  Filled 2023-12-22 (×2): qty 1

## 2023-12-22 MED ORDER — IOHEXOL 300 MG/ML  SOLN
INTRAMUSCULAR | Status: DC | PRN
  Administered 2023-12-22: 23 mL

## 2023-12-22 MED ORDER — SODIUM CHLORIDE 0.9% FLUSH
3.0000 mL | Freq: Two times a day (BID) | INTRAVENOUS | Status: DC
  Administered 2023-12-22 – 2023-12-23 (×2): 3 mL via INTRAVENOUS

## 2023-12-22 MED ORDER — MIDAZOLAM HCL 2 MG/2ML IJ SOLN
INTRAMUSCULAR | Status: AC
  Filled 2023-12-22: qty 2

## 2023-12-22 MED ORDER — HEPARIN SODIUM (PORCINE) 1000 UNIT/ML IJ SOLN
INTRAMUSCULAR | Status: DC | PRN
  Administered 2023-12-22: 2000 [IU] via INTRAVENOUS

## 2023-12-22 MED ORDER — SODIUM CHLORIDE 0.9% FLUSH: 3.0000 mL | INTRAVENOUS | Status: DC | PRN

## 2023-12-22 SURGICAL SUPPLY — 9 items
CATH BALLN WEDGE 5F 110CM (CATHETERS) IMPLANT
CATH INFINITI AMBI 5FR TG (CATHETERS) IMPLANT
DEVICE RAD TR BAND REGULAR (VASCULAR PRODUCTS) IMPLANT
DRAPE BRACHIAL (DRAPES) IMPLANT
GUIDEWIRE INQWIRE 1.5J.035X260 (WIRE) IMPLANT
PACK CARDIAC CATH (CUSTOM PROCEDURE TRAY) ×1 IMPLANT
SET ATX-X65L (MISCELLANEOUS) IMPLANT
SHEATH GLIDE SLENDER 4/5FR (SHEATH) IMPLANT
STATION PROTECTION PRESSURIZED (MISCELLANEOUS) IMPLANT
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
## 2023-12-22 NOTE — Progress Notes (Signed)
 Progress Note   Patient: Penny Gill PIR:[id number] DOB: [birth year] DOA: 12/20/2023     1 DOS: the patient was seen and examined on 12/22/2023     Brief hospital course: From HPI "ZITLALIC CANNIZZO is a 63 y.o. female with medical history significant of chronic HFrEF, COPD, cocaine abuse, hyperlipidemia, hypertension presented with acute respiratory failure with hypoxia, acute on chronic HFrEF, COPD exacerbation.  Patient reports increased work of breathing over the past 2 to 3 days.  Positive cough and wheezing.  Also with orthopnea, PND.  Noted to have been admitted March 2025 for similar issues with new diagnosis of HFrEF associated with cocaine abuse.  Patient reports compliance with diuretic regimen at home.  Denies any high salt intake or recent NSAID use.  Patient states she smoked crack roughly 2 days ago.  Developed acute onset of shortness of breath, increased work of breathing over the past 12 to 24 hours.  No abdominal pain or diarrhea.  Positive tobacco use.  No reported alcohol use.  Denies any other illicit drug use.  Positive significant wheezing. Presented to the ER afebrile, hemodynamically stable.  Is due for follow-up with outpatient cardiology in the [facility] system.  Requiring 4 L nasal cannula to keep O2 sats greater than 96%.  White count 6.8, hemoglobin 14, platelets 356, troponin in the 50s, VBG stable.  COVID flu and RSV negative.  BNP of 1612.  Creatinine 0.86.  Glucose 137.  EKG sinus tachycardia.  "   Assessment and Plan:   Acute respiratory failure with hypoxia (HCC) Decompensated resp status now requiring 3-4L [place] in setting of acute on chronic HFrEF, COPD,cocaine abuse  BNP 1612 Chest x-ray showing findings of fluid overload Diuresis discontinued by cardiology as currently patient is euvolemic Monitor input and output     Acute on chronic HFrEF (heart failure with reduced ejection fraction) (HCC) 2D ECHO 10/2023 EF 30-35% and grade 1 diastolic dysfunction  Acute  decompensated heart failure in setting of cocaine abuse  + vascular congestion and cardiomegaly on CXR  BNP 1612  Continue monitoring input and output Daily weighing Diuresis discontinued as patient is euvolemic Continue carvedilol, Farxiga, spironolactone  and losartan as recommended by cardiologist Patient counseled on cessation of cocaine Patient underwent cardiac catheterization today 12/22/2023 by cardiologist that found normal coronaries consistent with nonischemic cardiomyopathy   COPD (chronic obstructive pulmonary disease) (HCC) Mild cough and wheezing w/ overlapping CHF exacerbation in setting of cocaine abuse  Continue steroid therapy Continue nebulization Continue azithromycin     Elevated troponin with normal coronaries Trop in 50s on presentation  Cardiologist on board This is likely secondary to cocaine use     Hyperlipemia Continue Lipitor   Hypertension BP stable  Titrate home regimen      Cocaine abuse (HCC) + cocaine abuse w/ noted secondary acute resp failure on 3-4L [place]  UDS showing cocaine Patient has been counseled on abstinence   Tobacco dependence Active tobacco abuse to which patient does not specify Discussed cessation Low-dose nicotine  patch in the interim Monitor   Prediabetes Monitor glucose level Currently on sliding scale insulin  therapy    Advance Care Planning:   Code Status: Full Code    Consults: None at present- consider cardiology consultation as clinically appropriate    Family Communication: Family at the bedside      Subjective:  Patient seen and examined at bedside this morning Admits to improvement in respiratory function Underwent cardiac catheterization that found normal coronaries Denies chest pain nausea  cough abdominal pain   Physical Exam:   HENT:     Head: Normocephalic and atraumatic.     Nose: Nose normal.     Mouth/Throat:     Mouth: Mucous membranes are moist.  Eyes:     Pupils: Pupils are equal,  round, and reactive to light.  Neck:     Comments: + JVD    Cardiovascular:     Rate and Rhythm: Normal rate and regular rhythm.  Pulmonary:     Effort: Pulmonary effort is normal.  Expiratory wheezing noted Abdominal:     General: Bowel sounds are normal.  Musculoskeletal:        General: Normal range of motion.  Skin:    General: Skin is warm.  Neurological:     General: No focal deficit present.  Psychiatric:        Mood and Affect: Mood normal   Disposition: Hopefully discharge home tomorrow with home health after cardiology clearance  Data Reviewed:    Vitals:   12/22/23 1245 12/22/23 1300 12/22/23 1321 12/22/23 1521  BP: (!) 111/90 (!) 113/92 99/88 108/68  Pulse: 95 79 80 83  Resp: (!) 21 18 15 18   Temp:   98 F (36.7 C) 97.9 F (36.6 C)  TempSrc:      SpO2: 97% 98% 99% 98%  Weight:      Height:          Latest Ref Rng & Units 12/22/2023   11:02 AM 12/22/2023   10:58 AM 12/22/2023    4:46 AM  CBC  WBC 4.0 - 10.5 K/uL   14.0   Hemoglobin 12.0 - 15.0 g/dL 16.1  09.6  04.5   Hematocrit 36.0 - 46.0 % 40.0  40.0  38.9   Platelets 150 - 400 K/uL   398        Latest Ref Rng & Units 12/22/2023   11:02 AM 12/22/2023   10:58 AM 12/22/2023    4:46 AM  BMP  Glucose 70 - 99 mg/dL   95   BUN 8 - 23 mg/dL   43   Creatinine 4.09 - 1.00 mg/dL   8.11   Sodium 914 - 782 mmol/L 139  140  135   Potassium 3.5 - 5.1 mmol/L 4.0  4.0  3.8   Chloride 98 - 111 mmol/L   101   CO2 22 - 32 mmol/L   27   Calcium  8.9 - 10.3 mg/dL   9.0      Author: Ezzard Holms, MD 12/22/2023 3:32 PM  For on call review [email or web address].

## 2023-12-22 NOTE — Progress Notes (Signed)
 PT Cancellation Note  Patient Details Name: Penny Gill MRN: [id number] DOB: [birth year]   Cancelled Treatment:    Reason Eval/Treat Not Completed: Patient at procedure or test/unavailable, will attempt to see pt at a future date/time as medically appropriate.    Lavenia Post PT, DPT 12/22/23, 12:55 PM

## 2023-12-22 NOTE — Plan of Care (Signed)
  Problem: Clinical Measurements: Goal: Ability to maintain clinical measurements within normal limits will improve Outcome: Progressing   Problem: Nutrition: Goal: Adequate nutrition will be maintained Outcome: Progressing   Problem: Coping: Goal: Level of anxiety will decrease Outcome: Adequate for Discharge

## 2023-12-22 NOTE — Progress Notes (Signed)
 Heart Failure Nurse Navigator Progress Note  PCP: System, Provider Not In PCP-Cardiologist: Sammy Crisp, MD  Admission Diagnosis: Acute on Chronic Systolic Congestive Heart Failure (HCC) Crack cocaine use Admitted from: [facility]  Presentation:   Penny Gill presented with shortness of breath over the last 2 days. Dry cough, No peripheral edema. She did use crack cocaine at the onset of symptoms 2 days ago.  UDS positive for cocaine.She uses a vape as well. BNP 1,612.1. Chest x-ray: pulmonary edema superimposed upon emphysema similar to last month. BP 161/111, HR 111, R 20 on arrival to ED.  ECHO/ LVEF: 30-35% 11/15/2023 , Cath 12/22/23   Clinical Course:  Past Medical History:  Diagnosis Date   COPD (chronic obstructive pulmonary disease) (HCC)    emphysema   Depression    Hyperlipidemia    Hypertension    Kidney stone    Osteoporosis    Sickle cell trait (HCC)      Social History   Socioeconomic History   Marital status: Married    Spouse name: Not on file   Number of children: 2   Years of education: Not on file   Highest education level: Some college, no degree  Occupational History   Not on file  Tobacco Use   Smoking status: Every Day    Current packs/day: 0.20    Types: Cigarettes, E-cigarettes   Smokeless tobacco: Never   Tobacco comments:    I vape every now and then  Vaping Use   Vaping status: Never Used  Substance and Sexual Activity   Alcohol use: Not Currently    Comment: rare   Drug use: Not Currently    Types: Marijuana, Cocaine    Comment: History of cocaine abuse   Sexual activity: Not on file  Other Topics Concern   Not on file  Social History Narrative   Not on file   Social Drivers of Health   Financial Resource Strain: Medium Risk (12/22/2023)   Overall Financial Resource Strain (CARDIA)    Difficulty of Paying Living Expenses: Somewhat hard  Food Insecurity: No Food Insecurity (12/22/2023)   Hunger Vital Sign     Worried About Running Out of Food in the Last Year: Never true    Ran Out of Food in the Last Year: Never true  Transportation Needs: No Transportation Needs (12/22/2023)   PRAPARE - Administrator, Civil Service (Medical): No    Lack of Transportation (Non-Medical): No  Physical Activity: Not on File (09/10/2021)   Received from [place], [place]   Physical Activity    Physical Activity: 0  Stress: Not on File (09/10/2021)   Received from [facility], [place]   Stress    Stress: 0  Social Connections: Unknown (12/20/2023)   Social Connection and Isolation Panel [NHANES]    Frequency of Communication with Friends and Family: Not on file    Frequency of Social Gatherings with Friends and Family: Not on file    Attends Religious Services: Not on file    Active Member of Clubs or Organizations: Not on file    Attends Banker Meetings: Not on file    Marital Status: Separated   Education Assessment and Provision:  Detailed education and instructions provided on heart failure disease management including the following:  Signs and symptoms of Heart Failure When to call the physician Importance of daily weights Low sodium diet Fluid restriction Medication management Anticipated future follow-up appointments  Patient education given on each of  the above topics.  Patient acknowledges understanding via teach back method and acceptance of all instructions.  Education Materials:  "Living Better With Heart Failure" Booklet, HF zone tool, & Daily Weight Tracker Tool.  Patient has scale at home: There is one in building she lives in that she can use. Patient has pill box at home: Not right now but says they will give her one where she lives.    High Risk Criteria for Readmission and/or Poor Patient Outcomes: Heart failure hospital admissions (last 6 months): 1  No Show rate: 5% Difficult social situation: Drug & Smoking Cessation Demonstrates medication adherence: Yes Primary  Language: English Literacy level: Reading, Writing & Comprehension  Barriers of Care:   None  Considerations/Referrals:   Referral made to Heart Failure Pharmacist Stewardship: Yes Referral made to Heart Failure CSW/NCM TOC: No Referral made to Heart & Vascular TOC clinic: Yes. [facility] TOC scheduled 12/28/23 @ 3:00 PM  Items for Follow-up on DC/TOC: Diet & Fluid Restrictions Daily Weights Drug & Smoking Cessation Continued Heart Failure Medication   Celedonio Coil, RN, BSN [facility] Heart Failure Navigator Secure Chat Only

## 2023-12-22 NOTE — Interval H&P Note (Signed)
 History and Physical Interval Note:  12/22/2023 9:30 AM  Penny Gill  has presented today for surgery, with the diagnosis of heart failure with reduced ejection fraction.  The various methods of treatment have been discussed with the patient and family. After consideration of risks, benefits and other options for treatment, the patient has consented to  Procedure(s): RIGHT/LEFT HEART CATH AND CORONARY ANGIOGRAPHY (N/A) as a surgical intervention.  The patient's history has been reviewed, patient examined, no change in status, stable for surgery.  I have reviewed the patient's chart and labs.  Questions were answered to the patient's satisfaction.    Cath Lab Visit (complete for each Cath Lab visit)  Clinical Evaluation Leading to the Procedure:   ACS: No.  Non-ACS:    Anginal/Heart Failure Classification: NYHA class IV  Anti-ischemic medical therapy: Minimal Therapy (1 class of medications)  Non-Invasive Test Results: No non-invasive testing performed (LVEF 30-35% -> intermediate to high risk)  Prior CABG: No previous CABG  Penny Gill

## 2023-12-22 NOTE — Progress Notes (Signed)
 Rounding Note    Patient Name: Penny Gill Date of Encounter: 12/22/2023  [facility] Cardiologist: Antionette Kirks, MD   Subjective   Patient reports feeling well today. She denies chest pain and shortness of breath. HR and BP stable. R/LHC tentatively planned for this morning. Patient is NPO.   Inpatient Medications    Scheduled Meds:  [MAR Hold] aspirin  EC  81 mg Oral Daily   [MAR Hold] atorvastatin   40 mg Oral QHS   [MAR Hold] carvedilol  3.125 mg Oral BID WC   [MAR Hold] dapagliflozin propanediol  10 mg Oral Daily   [MAR Hold] enoxaparin  (LOVENOX ) injection  30 mg Subcutaneous Q24H   [MAR Hold] feeding supplement  237 mL Oral BID BM   [MAR Hold] folic acid  1 mg Oral Daily   [MAR Hold] insulin  aspart  0-9 Units Subcutaneous TID WC   [MAR Hold] multivitamin with minerals  1 tablet Oral Daily   [MAR Hold] nicotine   7 mg Transdermal Daily   [MAR Hold] predniSONE   40 mg Oral Q breakfast   [MAR Hold] sodium chloride  flush  3 mL Intravenous Q12H   [MAR Hold] spironolactone   12.5 mg Oral Daily   [MAR Hold] thiamine  100 mg Oral Daily   Or   [MAR Hold] thiamine  100 mg Intravenous Daily   Continuous Infusions:  sodium chloride  10 mL/hr at 12/22/23 0514   PRN Meds: [MAR Hold] acetaminophen , [MAR Hold] albuterol , [MAR Hold] LORazepam **OR** [MAR Hold] LORazepam, [MAR Hold] ondansetron  **OR** [MAR Hold] ondansetron  (ZOFRAN ) IV, [MAR Hold] sodium chloride  flush   Vital Signs    Vitals:   12/22/23 0455 12/22/23 0634 12/22/23 0749 12/22/23 0912  BP: 117/87  (!) 118/91 (!) 110/90  Pulse: 77  77 82  Resp: 20  17 11   Temp: 97.9 F (36.6 C)  98.6 F (37 C) 98.3 F (36.8 C)  TempSrc:    Oral  SpO2: 97%  99% 98%  Weight:  33.8 kg    Height:       No intake or output data in the 24 hours ending 12/22/23 0955    12/22/2023    6:34 AM 12/21/2023    3:24 AM 12/20/2023    4:49 AM  Last 3 Weights  Weight (lbs) 74 lb 8.3 oz 73 lb 9.6 oz 72 lb  Weight (kg) 33.8 kg  33.385 kg 32.659 kg      Telemetry    Sinus rhythm with occasional PACs/PVCs - Personally Reviewed  Physical Exam   GEN: Frail appearing. No acute distress.   Neck: No JVD Cardiac: RRR, no murmurs, rubs, or gallops.  Respiratory: Clear to auscultation bilaterally. GI: Soft, nontender, non-distended  MS: No edema; No deformity. Neuro:  Nonfocal  Psych: Normal affect   Labs    High Sensitivity Troponin:   Recent Labs  Lab 12/20/23 0513 12/20/23 0726 12/20/23 0912 12/20/23 1047  TROPONINIHS 52* 53* 61* 52*     Chemistry Recent Labs  Lab 12/20/23 0513 12/21/23 0450 12/22/23 0446  NA 140 136 135  K 3.6 3.8 3.8  CL 103 103 101  CO2 26 24 27   GLUCOSE 137* 229* 95  BUN 16 30* 43*  CREATININE 0.86 0.85 0.93  CALCIUM  9.2 9.0 9.0  MG  --   --  2.1  PROT 7.4 6.6  --   ALBUMIN 3.9 3.3*  --   AST 36 21  --   ALT 33 21  --   ALKPHOS 91  71  --   BILITOT 0.7 0.8  --   GFRNONAA >60 >60 >60  ANIONGAP 11 9 7     Lipids No results for input(s): "CHOL", "TRIG", "HDL", "LABVLDL", "LDLCALC", "CHOLHDL" in the last 168 hours.  Hematology Recent Labs  Lab 12/20/23 0513 12/21/23 0450 12/22/23 0446  WBC 6.8 9.7 14.0*  RBC 4.66 4.16 4.41  HGB 14.0 12.5 13.3  HCT 42.4 36.2 38.9  MCV 91.0 87.0 88.2  MCH 30.0 30.0 30.2  MCHC 33.0 34.5 34.2  RDW 12.0 11.9 11.9  PLT 356 349 398   Thyroid  No results for input(s): "TSH", "FREET4" in the last 168 hours.  BNP Recent Labs  Lab 12/20/23 0513  BNP 1,612.1*    DDimer No results for input(s): "DDIMER" in the last 168 hours.   Radiology    No results found.  Cardiac Studies   11/15/2023 Echo complete 1. Left ventricular ejection fraction, by estimation, is 30 to 35%. The  left ventricle has moderate to severely decreased function. The left  ventricle demonstrates global hypokinesis. The left ventricular internal  cavity size was mildly dilated. There  is mild left ventricular hypertrophy. Left ventricular diastolic   parameters are consistent with Grade I diastolic dysfunction (impaired  relaxation).   2. Right ventricular systolic function is normal. The right ventricular  size is normal.   3. The mitral valve is normal in structure. Mild mitral valve  regurgitation.   4. The aortic valve is tricuspid. Aortic valve regurgitation is not  visualized.   5. The inferior vena cava is normal in size with greater than 50%  respiratory variability, suggesting right atrial pressure of 3 mmHg.   Patient Profile     63 y.o. female  with a hx of cocaine and tobacco abuse, hypertension, hyperlipidemia, diabetes, COPD not on O2, depression, chronic pain, and sickle cell trait, admitted 4/27 with 2-3 days of shortness of breath. Cardiology was asked to consult for acute on chronic HFrEF.   Assessment & Plan    Acute on chronic HFrEF - Recent hospitalization 10/2023 with echo showing EF 30 to 35%, global hypokinesis mild LVH, G1 DD, mild MR. Unknown chronicity of reduced EF with no prior echo on file. 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 Underwent IV diuresis and was discharged on spironolactone  and Lasix .  - Presented 4/27 with 2-3 days of worsening dyspnea, orthopnea, PND, and LE edema - S/p IV Lasix  80 mg, 60 mg x2 with I/Os poorly recorded - BUN worsening, will hold Lasix  today - R/LHC will better outline volume status - Continue spironolactone , carvedilol, and Farxiga  Elevated troponin - Troponin mildly elevated and flat trending  - Prior echo 10/2023 with EF 30-35%, global hypokinesis - Denies chest pain - EKG without acute ischemic changes - Suspect demand ischemia in the setting of volume overload and ongoing cocaine use - Given several risk factors, plan to proceed with cardiac catheterization tentatively scheduled for this morning  Acute hypoxic respiratory failure - Resolved, now on room air - Likely combined etiology of COPD and acute on chronic HFrEF - Nebulizer and steroids per  IM  COPD - Improved - Nebulizer, antibiotics, and steroids per IM  Hyperlipidemia - Most recent lipid panel 10/2023 with LDL 167 - Continue atorvastatin  40 mg daily  Hypertension - BP labile during admission - Continue spironolactone , carvedilol, and Farxiga as above  Cocaine abuse - UDS positive for cocaine - Encouraged abstinence and patient is in agreement. She is interested in going to a facility  to help her stop using  For questions or updates, please contact [facility] Please consult [email or web address] for contact info under        Signed, Brodie Cannon, PA-C  12/22/2023, 9:55 AM

## 2023-12-23 ENCOUNTER — Other Ambulatory Visit: Payer: Self-pay

## 2023-12-23 DIAGNOSIS — I509 Heart failure, unspecified: Secondary | ICD-10-CM | POA: Diagnosis not present

## 2023-12-23 DIAGNOSIS — F1490 Cocaine use, unspecified, uncomplicated: Secondary | ICD-10-CM

## 2023-12-23 DIAGNOSIS — R7989 Other specified abnormal findings of blood chemistry: Secondary | ICD-10-CM

## 2023-12-23 DIAGNOSIS — I427 Cardiomyopathy due to drug and external agent: Secondary | ICD-10-CM

## 2023-12-23 LAB — BASIC METABOLIC PANEL WITH GFR
Anion gap: 11 (ref 5–15)
BUN: 42 mg/dL — ABNORMAL HIGH (ref 8–23)
CO2: 21 mmol/L — ABNORMAL LOW (ref 22–32)
Calcium: 8 mg/dL — ABNORMAL LOW (ref 8.9–10.3)
Chloride: 97 mmol/L — ABNORMAL LOW (ref 98–111)
Creatinine, Ser: 0.67 mg/dL (ref 0.44–1.00)
GFR, Estimated: >60 mL/min (ref >60)
Glucose, Bld: 90 mg/dL (ref 70–99)
Potassium: 4.2 mmol/L (ref 3.5–5.1)
Sodium: 129 mmol/L — ABNORMAL LOW (ref 135–145)

## 2023-12-23 LAB — CBC WITH DIFFERENTIAL/PLATELET
Abs Immature Granulocytes: 0.06 10*3/uL (ref 0.00–0.07)
Basophils Absolute: 0 10*3/uL (ref 0.0–0.1)
Basophils Relative: 0 %
Eosinophils Absolute: 0.1 10*3/uL (ref 0.0–0.5)
Eosinophils Relative: 1 %
HCT: 37.1 % (ref 36.0–46.0)
Hemoglobin: 12.7 g/dL (ref 12.0–15.0)
Immature Granulocytes: 1 %
Lymphocytes Relative: 26 %
Lymphs Abs: 3.2 10*3/uL (ref 0.7–4.0)
MCH: 30.4 pg (ref 26.0–34.0)
MCHC: 34.2 g/dL (ref 30.0–36.0)
MCV: 88.8 fL (ref 80.0–100.0)
Monocytes Absolute: 0.7 10*3/uL (ref 0.1–1.0)
Monocytes Relative: 6 %
Neutro Abs: 8.1 10*3/uL — ABNORMAL HIGH (ref 1.7–7.7)
Neutrophils Relative %: 66 %
Platelets: 362 10*3/uL (ref 150–400)
RBC: 4.18 MIL/uL (ref 3.87–5.11)
RDW: 12 % (ref 11.5–15.5)
WBC: 12.2 10*3/uL — ABNORMAL HIGH (ref 4.0–10.5)
nRBC: 0 % (ref 0.0–0.2)

## 2023-12-23 LAB — GLUCOSE, CAPILLARY: Glucose-Capillary: 185 mg/dL — ABNORMAL HIGH (ref 70–99)

## 2023-12-23 MED ORDER — FOLIC ACID 1 MG PO TABS
1.0000 mg | ORAL_TABLET | Freq: Every day | ORAL | 0 refills | Status: DC
  Filled 2023-12-23: qty 30, 30d supply, fill #0

## 2023-12-23 MED ORDER — ADULT MULTIVITAMIN W/MINERALS CH: 1.0000 | ORAL_TABLET | Freq: Every day | ORAL | Status: AC

## 2023-12-23 MED ORDER — FUROSEMIDE 20 MG PO TABS
20.0000 mg | ORAL_TABLET | Freq: Every day | ORAL | 2 refills | Status: DC | PRN
  Filled 2023-12-23: qty 30, 30d supply, fill #0

## 2023-12-23 MED ORDER — VITAMIN B-1 100 MG PO TABS
100.0000 mg | ORAL_TABLET | Freq: Every day | ORAL | 0 refills | Status: DC
  Filled 2023-12-23: qty 30, 30d supply, fill #0

## 2023-12-23 MED ORDER — DAPAGLIFLOZIN PROPANEDIOL 10 MG PO TABS
10.0000 mg | ORAL_TABLET | Freq: Every day | ORAL | 0 refills | Status: DC
  Filled 2023-12-23: qty 30, 30d supply, fill #0

## 2023-12-23 MED ORDER — LOSARTAN POTASSIUM 25 MG PO TABS
12.5000 mg | ORAL_TABLET | Freq: Every day | ORAL | 0 refills | Status: DC
  Filled 2023-12-23: qty 30, 60d supply, fill #0

## 2023-12-23 MED ORDER — NICOTINE 7 MG/24HR TD PT24
7.0000 mg | MEDICATED_PATCH | Freq: Every day | TRANSDERMAL | 0 refills | Status: AC
  Filled 2023-12-23: qty 28, 28d supply, fill #0

## 2023-12-23 MED ORDER — CARVEDILOL 3.125 MG PO TABS
3.1250 mg | ORAL_TABLET | Freq: Two times a day (BID) | ORAL | 0 refills | Status: DC
  Filled 2023-12-23: qty 60, 30d supply, fill #0

## 2023-12-23 MED ORDER — ACETAMINOPHEN 325 MG PO TABS: 650.0000 mg | ORAL_TABLET | Freq: Four times a day (QID) | ORAL | Status: AC | PRN

## 2023-12-23 NOTE — Progress Notes (Signed)
     [facility] REFERRAL        Occupational Therapy * Physical Therapy * Speech Therapy                           DATE 12/23/2023  PATIENT NAME Penny Gill   PATIENT MRN [id number]       DIAGNOSIS/DIAGNOSIS CODE I50.9  DATE OF DISCHARGE: 12/23/2023       PRIMARY CARE PHYSICIAN      PCP PHONE/FAX      Dear Provider (Name: [facility] outpatient __  Fax: [phone]   I certify that I have examined this patient and that occupational/physical/speech therapy is necessary on an outpatient basis.    The patient has expressed interest in completing their recommended course of therapy at your  location.  Once a formal order from the patient's primary care physician has been obtained, please  contact him/her to schedule an appointment for evaluation at your earliest convenience.   [x ]  Physical Therapy Evaluate and Treat  [x]   Occupational Therapy Evaluate and Treat  [  ]  Speech Therapy Evaluate and Treat         The patient's primary care physician (listed above) must furnish and be responsible for a formal order such that the recommended services may be furnished while under the primary physician's care, and that the plan of care will be established and reviewed every 30 days (or more often if condition necessitates).

## 2023-12-23 NOTE — Progress Notes (Signed)
 Physical Therapy Treatment Patient Details Name: Penny Gill MRN: [id number] DOB: 04/17/1961 Today's Date: 12/23/2023   History of Present Illness Pt is a 63 y/o F admitted on 12/20/23 after presenting with c/o SOB x 2 days, dry cough. Pt is being treated for CHF exacerbation. PMH: CHF, COPD, crack cocaine use, HTN, HLD, sickle cell trait    PT Comments  Pt received supine agreeable to PT. Pt exiting bed mod-I and standing independently.  Pt completing ~200' of gait with good reciprocal gait and foot clearance. Pt ambulated at gait speeds 2'/sec indicative of low falls risk with short community tasks. Pt returning to supine with all needs in reach. Education on importance of OOB mobility and ambulation due to dx of CHF. Pt understanding. Pt with all needs in reach. D/c recs remain appropriate.    If plan is discharge home, recommend the following: A little help with walking and/or transfers;A little help with bathing/dressing/bathroom;Assistance with cooking/housework;Assist for transportation;Help with stairs or ramp for entrance   Can travel by private vehicle        Equipment Recommendations  Rolling walker (2 wheels)    Recommendations for Other Services       Precautions / Restrictions Precautions Precautions: Fall Restrictions Weight Bearing Restrictions Per Provider Order: No     Mobility  Bed Mobility Overal bed mobility: Modified Independent               Patient Response: Cooperative  Transfers Overall transfer level: Needs assistance Equipment used: None Transfers: Sit to/from Stand Sit to Stand: Independent                Ambulation/Gait Ambulation/Gait assistance: Supervision Gait Distance (Feet): 200 Feet Assistive device: Rolling walker (2 wheels) Gait Pattern/deviations: Step-through pattern, Decreased step length - left, Decreased step length - right Gait velocity: 10' in 5 sec = 2'/sec Gait velocity interpretation: 1.31 - 2.62 ft/sec,  indicative of limited Financial controller     Tilt Bed Tilt Bed Patient Response: Cooperative  Modified Rankin (Stroke Patients Only)       Balance Overall balance assessment: Mild deficits observed, not formally tested                                          Communication Communication Communication: No apparent difficulties  Cognition Arousal: Alert Behavior During Therapy: WFL for tasks assessed/performed   PT - Cognitive impairments: No apparent impairments                         Following commands: Intact      Cueing Cueing Techniques: Verbal cues  Exercises Other Exercises Other Exercises: discussed RW height/ergonomics, importance of OOB mobility for cardiorespiratory improvement.    General Comments        Pertinent Vitals/Pain      Home Living                          Prior Function            PT Goals (current goals can now be found in the care plan section) Acute Rehab PT Goals Patient Stated Goal: get better PT Goal Formulation: With patient Time For Goal Achievement: 01/03/24 Potential to  Achieve Goals: Good Additional Goals Additional Goal #1: Pt will score 52/56 on Berg Balance Test to demonstrate decreased fall risk & improved balance. Progress towards PT goals: Progressing toward goals    Frequency    Min 2X/week      PT Plan      Co-evaluation              AM-PAC PT "6 Clicks" Mobility   Outcome Measure  Help needed turning from your back to your side while in a flat bed without using bedrails?: None Help needed moving from lying on your back to sitting on the side of a flat bed without using bedrails?: None Help needed moving to and from a bed to a chair (including a wheelchair)?: A Little Help needed standing up from a chair using your arms (e.g., wheelchair or bedside chair)?: A Little Help needed to walk in hospital  room?: A Little Help needed climbing 3-5 steps with a railing? : A Little 6 Click Score: 20    End of Session   Activity Tolerance: Patient tolerated treatment well Patient left: in bed;with call bell/phone within reach;with bed alarm set Nurse Communication: Mobility status PT Visit Diagnosis: Unsteadiness on feet (R26.81);History of falling (Z91.81)     Time: 1610-9604 PT Time Calculation (min) (ACUTE ONLY): 12 min  Charges:    $Therapeutic Activity: 8-22 mins PT General Charges $$ ACUTE PT VISIT: 1 Visit                     Marc Senior. Fairly IV, PT, DPT Physical Therapist- [place]  [facility]  12/23/2023, 10:52 AM

## 2023-12-23 NOTE — Discharge Summary (Signed)
 Physician Discharge Summary   Patient: Penny Gill MRN: [id number]  DOB: 10-08-1960   Admit:     Date of Admission: 12/20/2023 Admitted from: home   Discharge: Date of discharge: 12/23/23 Disposition: Home Condition at discharge: good  CODE STATUS: FULL CODE     Discharge Physician: Melodi Sprung, DO [facility]     PCP: System, Provider Not In  Recommendations for Outpatient Follow-up:  Follow up with cardiology as directed Pt needs to establish w/ PCP Discharge Instructions     (HEART FAILURE PATIENTS) Call MD:  Anytime you have any of the following symptoms: 1) 3 pound weight gain in 24 hours or 5 pounds in 1 week 2) shortness of breath, with or without a dry hacking cough 3) swelling in the hands, feet or stomach 4) if you have to sleep on extra pillows at night in order to breathe.   Complete by: As directed    Ambulatory referral to Physical Therapy   Complete by: As directed    Diet - low sodium heart healthy   Complete by: As directed    Increase activity slowly   Complete by: As directed          Discharge Diagnoses: Principal Problem:   CHF (congestive heart failure) (HCC) Active Problems:   COPD (chronic obstructive pulmonary disease) (HCC)   Acute on chronic HFrEF (heart failure with reduced ejection fraction) (HCC)   Acute respiratory failure with hypoxia (HCC)   Elevated troponin   Hypertension   Hyperlipemia   Tobacco dependence   Cocaine abuse (HCC)   Prediabetes   Crack cocaine use   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 female with medical history significant of chronic HFrEF, COPD, cocaine abuse, hyperlipidemia, hypertension presented with acute respiratory failure with hypoxia, acute on chronic HFrEF, COPD exacerbation.  Patient reports increased work of breathing over the past 2 to 3 days.  Positive cough and wheezing.  Also with  orthopnea, PND.  Noted to have been admitted March 2025 for similar issues with new diagnosis of HFrEF associated with cocaine abuse.  Patient reports compliance with diuretic regimen at home.  Denies any high salt intake or recent NSAID use.  Patient states she smoked crack roughly 2 days ago.  Developed acute onset of shortness of breath   04/27: admitted to hospitalist service  04/28: cardiology consult. Switched to po diuretics, added beta blocker, given risk factors advised for L/R cardiac cath  04/29: underwent cardiac catheterization today - normal coronaries consistent with nonischemic cardiomyopathy 04/30: stable for discharge      Consultants:  Cardiology   Procedures/Surgeries: 12/22/2023 cardiac cath       ASSESSMENT & PLAN:   Acute respiratory failure with hypoxia  Resolved w/ diuresis    Acute on chronic HFrEF (heart failure with reduced ejection fraction)  2D ECHO 10/2023 EF 30-35% and grade 1 diastolic dysfunction  Acute decompensated heart failure in setting of cocaine abuse  + vascular congestion and cardiomegaly on CXR  BNP 1612  Continue carvedilol, Farxiga, spironolactone  and losartan as recommended by cardiologist Patient counseled on cessation of cocaine Patient underwent cardiac catheterization 12/22/2023 by cardiologist that found normal coronaries consistent with nonischemic cardiomyopathy Follow outpatient    COPD (chronic obstructive pulmonary disease) Mild cough and wheezing w/ overlapping CHF exacerbation in setting of cocaine abuse  improved    Elevated troponin with  normal coronaries This is likely secondary to cocaine use    Hyperlipemia Continue Lipitor   Hypertension BP stable      Cocaine abuse  + cocaine abuse w/ noted secondary acute resp failure on 3-4L [place]  UDS showing cocaine Patient has been counseled on abstinence   Tobacco dependence Active tobacco abuse to which patient does not specify Discussed cessation nicotine   patch in the interim Monitor   Prediabetes Follow w/ PCP            Discharge Instructions  Allergies as of 12/23/2023   No Known Allergies      Medication List     STOP taking these medications    diclofenac  Sodium 1 % Gel Commonly known as: VOLTAREN        TAKE these medications    acetaminophen  325 MG tablet Commonly known as: TYLENOL  Take 2 tablets (650 mg total) by mouth every 6 (six) hours as needed for mild pain (pain score 1-3), moderate pain (pain score 4-6), fever or headache (or Fever >/= 101).   aspirin  EC 81 MG tablet Take 1 tablet (81 mg total) by mouth daily. Swallow whole.   atorvastatin  40 MG tablet Commonly known as: LIPITOR Take 1 tablet (40 mg total) by mouth at bedtime.   carvedilol 3.125 MG tablet Commonly known as: COREG Take 1 tablet (3.125 mg total) by mouth 2 (two) times daily with a meal.   dapagliflozin propanediol 10 MG Tabs tablet Commonly known as: FARXIGA Take 1 tablet (10 mg total) by mouth daily. Start taking on: Dec 24, 2023   DULoxetine 30 MG capsule Commonly known as: CYMBALTA Take 1 capsule by mouth daily.   feeding supplement Liqd Take 237 mLs by mouth 2 (two) times daily between meals.   folic acid 1 MG tablet Commonly known as: FOLVITE Take 1 tablet (1 mg total) by mouth daily. Start taking on: Dec 24, 2023   furosemide  20 MG tablet Commonly known as: LASIX  Take 1 tablet (20 mg total) by mouth daily as needed for fluid. Weight > 3 lb in a 24 hour period. What changed:  when to take this reasons to take this additional instructions   gabapentin  300 MG capsule Commonly known as: NEURONTIN  Take 300 mg by mouth 4 (four) times daily.   losartan 25 MG tablet Commonly known as: COZAAR Take 0.5 tablets (12.5 mg total) by mouth daily. Start taking on: Dec 24, 2023   multivitamin with minerals Tabs tablet Take 1 tablet by mouth daily. Start taking on: Dec 24, 2023   nicotine  7 mg/24hr patch Commonly  known as: NICODERM CQ  - dosed in mg/24 hr Place 1 patch (7 mg total) onto the skin daily. Start taking on: Dec 24, 2023   spironolactone  25 MG tablet Commonly known as: ALDACTONE  Take 0.5 tablets (12.5 mg total) by mouth daily.   thiamine 100 MG tablet Commonly known as: VITAMIN B1 Take 1 tablet (100 mg total) by mouth daily. Start taking on: Dec 24, 2023   tiZANidine 2 MG tablet Commonly known as: ZANAFLEX Take 2 mg by mouth every 8 (eight) hours as needed for muscle spasms.               Durable Medical Equipment  (From admission, onward)           Start     Ordered   12/22/23 1532  For home use only DME Walker rolling  Once       Question Answer Comment  Walker:  With 5 Inch Wheels   Patient needs a walker to treat with the following condition Ambulatory dysfunction      12/22/23 1531   12/21/23 1502  For home use only DME Walker rolling  Once       Question Answer Comment  Walker: With 5 Inch Wheels   Patient needs a walker to treat with the following condition Ambulatory dysfunction      12/21/23 1501             Follow-up Information     [facility]. Go on 12/28/2023.   Specialty: Cardiology Why: Hospital Follow-Up 12/28/2023 @ 3:00PM  Please bring all medications with you to your appointment [facility], [address], [address] Free Valet Parking @ [employer] account planner information: [address] [place]  [address] [phone]                No Known Allergies   Subjective: pt reprots feeling well this morning and eager for dc home, no CP/SOB   Discharge Exam: BP (!) 127/92 (BP Location: Right Arm)   Pulse 79   Temp 98.5 F (36.9 C) (Oral)   Resp 16   Ht 5' (1.524 m)   Wt 34.8 kg   SpO2 100%   BMI 14.98 kg/m  General: Pt is alert, awake, not in acute distress Cardiovascular: RRR, S1/S2 +, no rubs, no gallops Respiratory: CTA bilaterally, no  wheezing, no rhonchi Abdominal: Soft, NT, ND, bowel sounds + Extremities: no edema, no cyanosis     The results of significant diagnostics from this hospitalization (including imaging, microbiology, ancillary and laboratory) are listed below for reference.     Microbiology: Recent Results (from the past 240 hours)  Resp panel by RT-PCR (RSV, Flu A&B, Covid) Anterior Nasal Swab     Status: None   Collection Time: 12/20/23  5:30 AM   Specimen: Anterior Nasal Swab  Result Value Ref Range Status   SARS Coronavirus 2 by RT PCR NEGATIVE NEGATIVE Final    Comment: (NOTE) SARS-CoV-2 target nucleic acids are NOT DETECTED.  The SARS-CoV-2 RNA is generally detectable in upper respiratory specimens during the acute phase of infection. The lowest concentration of SARS-CoV-2 viral copies this assay can detect is 138 copies/mL. A negative result does not preclude SARS-Cov-2 infection and should not be used as the sole basis for treatment or other patient management decisions. A negative result may occur with  improper specimen collection/handling, submission of specimen other than nasopharyngeal swab, presence of viral mutation(s) within the areas targeted by this assay, and inadequate number of viral copies(<138 copies/mL). A negative result must be combined with clinical observations, patient history, and epidemiological information. The expected result is Negative.  Fact Sheet for Patients:  BloggerCourse.com  Fact Sheet for Healthcare Providers:  SeriousBroker.it  This test is no t yet approved or cleared by the [place]  FDA and  has been authorized for detection and/or diagnosis of SARS-CoV-2 by FDA under an Emergency Use Authorization (EUA). This EUA will remain  in effect (meaning this test can be used) for the duration of the COVID-19 declaration under Section 564(b)(1) of the Act, 21 U.S.C.section 360bbb-3(b)(1), unless the  authorization is terminated  or revoked sooner.       Influenza A by PCR NEGATIVE NEGATIVE Final   Influenza B by PCR NEGATIVE NEGATIVE Final    Comment: (NOTE) The Xpert Xpress SARS-CoV-2/FLU/RSV plus assay is intended as an aid in  the diagnosis of influenza from Nasopharyngeal swab specimens and should not be used as a sole basis for treatment. Nasal washings and aspirates are unacceptable for Xpert Xpress SARS-CoV-2/FLU/RSV testing.  Fact Sheet for Patients: BloggerCourse.com  Fact Sheet for Healthcare Providers: SeriousBroker.it  This test is not yet approved or cleared by the [place]  FDA and has been authorized for detection and/or diagnosis of SARS-CoV-2 by FDA under an Emergency Use Authorization (EUA). This EUA will remain in effect (meaning this test can be used) for the duration of the COVID-19 declaration under Section 564(b)(1) of the Act, 21 U.S.C. section 360bbb-3(b)(1), unless the authorization is terminated or revoked.     Resp Syncytial Virus by PCR NEGATIVE NEGATIVE Final    Comment: (NOTE) Fact Sheet for Patients: BloggerCourse.com  Fact Sheet for Healthcare Providers: SeriousBroker.it  This test is not yet approved or cleared by the [place]  FDA and has been authorized for detection and/or diagnosis of SARS-CoV-2 by FDA under an Emergency Use Authorization (EUA). This EUA will remain in effect (meaning this test can be used) for the duration of the COVID-19 declaration under Section 564(b)(1) of the Act, 21 U.S.C. section 360bbb-3(b)(1), unless the authorization is terminated or revoked.  Performed at [facility], [address] [place], [place] [address]      Labs: BNP (last 3 results) Recent Labs    11/14/23 1832 12/20/23 0513  BNP 940.5* 1,612.1*   Basic Metabolic Panel: Recent Labs  Lab 12/20/23 0513  12/21/23 0450 12/22/23 0446 12/22/23 1058 12/22/23 1102 12/23/23 0510  NA 140 136 135 140 139 129*  K 3.6 3.8 3.8 4.0 4.0 4.2  CL 103 103 101  --   --  97*  CO2 26 24 27   --   --  21*  GLUCOSE 137* 229* 95  --   --  90  BUN 16 30* 43*  --   --  42*  CREATININE 0.86 0.85 0.93  --   --  0.67  CALCIUM  9.2 9.0 9.0  --   --  8.0*  MG  --   --  2.1  --   --   --   PHOS  --   --  3.3  --   --   --    Liver Function Tests: Recent Labs  Lab 12/20/23 0513 12/21/23 0450  AST 36 21  ALT 33 21  ALKPHOS 91 71  BILITOT 0.7 0.8  PROT 7.4 6.6  ALBUMIN 3.9 3.3*   No results for input(s): "LIPASE", "AMYLASE" in the last 168 hours. No results for input(s): "AMMONIA" in the last 168 hours. CBC: Recent Labs  Lab 12/20/23 0513 12/21/23 0450 12/22/23 0446 12/22/23 1058 12/22/23 1102 12/23/23 0510  WBC 6.8 9.7 14.0*  --   --  12.2*  NEUTROABS 3.7  --  10.4*  --   --  8.1*  HGB 14.0 12.5 13.3 13.6 13.6 12.7  HCT 42.4 36.2 38.9 40.0 40.0 37.1  MCV 91.0 87.0 88.2  --   --  88.8  PLT 356 349 398  --   --  362   Cardiac Enzymes: No results for input(s): "CKTOTAL", "CKMB", "CKMBINDEX", "TROPONINI" in the last 168 hours. BNP: Invalid input(s): "POCBNP" CBG: Recent Labs  Lab 12/21/23 2036 12/22/23 0749 12/22/23 1517 12/22/23 2102 12/23/23 0829  GLUCAP 183* 93 212* 183* 185*   D-Dimer No results for input(s): "DDIMER" in the last 72 hours. Hgb A1c No results for input(s): "HGBA1C" in the last 72  hours. Lipid Profile No results for input(s): "CHOL", "HDL", "LDLCALC", "TRIG", "CHOLHDL", "LDLDIRECT" in the last 72 hours. Thyroid  function studies No results for input(s): "TSH", "T4TOTAL", "T3FREE", "THYROIDAB" in the last 72 hours.  Invalid input(s): "FREET3" Anemia work up Recent Labs    12/22/23 0446  VITAMINB12 452   Urinalysis    Component Value Date/Time   COLORURINE YELLOW (A) 11/14/2023 2144   APPEARANCEUR HAZY (A) 11/14/2023 2144   APPEARANCEUR Hazy (A) 12/30/2019  1042   LABSPEC 1.015 11/14/2023 2144   LABSPEC 1.011 07/05/2014 1255   PHURINE 7.0 11/14/2023 2144   GLUCOSEU NEGATIVE 11/14/2023 2144   GLUCOSEU Negative 07/05/2014 1255   HGBUR NEGATIVE 11/14/2023 2144   BILIRUBINUR NEGATIVE 11/14/2023 2144   BILIRUBINUR Negative 12/30/2019 1042   BILIRUBINUR Negative 07/05/2014 1255   KETONESUR NEGATIVE 11/14/2023 2144   PROTEINUR 100 (A) 11/14/2023 2144   NITRITE NEGATIVE 11/14/2023 2144   LEUKOCYTESUR NEGATIVE 11/14/2023 2144   LEUKOCYTESUR 1+ 07/05/2014 1255   Sepsis Labs Recent Labs  Lab 12/20/23 0513 12/21/23 0450 12/22/23 0446 12/23/23 0510  WBC 6.8 9.7 14.0* 12.2*   Microbiology Recent Results (from the past 240 hours)  Resp panel by RT-PCR (RSV, Flu A&B, Covid) Anterior Nasal Swab     Status: None   Collection Time: 12/20/23  5:30 AM   Specimen: Anterior Nasal Swab  Result Value Ref Range Status   SARS Coronavirus 2 by RT PCR NEGATIVE NEGATIVE Final    Comment: (NOTE) SARS-CoV-2 target nucleic acids are NOT DETECTED.  The SARS-CoV-2 RNA is generally detectable in upper respiratory specimens during the acute phase of infection. The lowest concentration of SARS-CoV-2 viral copies this assay can detect is 138 copies/mL. A negative result does not preclude SARS-Cov-2 infection and should not be used as the sole basis for treatment or other patient management decisions. A negative result may occur with  improper specimen collection/handling, submission of specimen other than nasopharyngeal swab, presence of viral mutation(s) within the areas targeted by this assay, and inadequate number of viral copies(<138 copies/mL). A negative result must be combined with clinical observations, patient history, and epidemiological information. The expected result is Negative.  Fact Sheet for Patients:  BloggerCourse.com  Fact Sheet for Healthcare Providers:  SeriousBroker.it  This test is  no t yet approved or cleared by the [place]  FDA and  has been authorized for detection and/or diagnosis of SARS-CoV-2 by FDA under an Emergency Use Authorization (EUA). This EUA will remain  in effect (meaning this test can be used) for the duration of the COVID-19 declaration under Section 564(b)(1) of the Act, 21 U.S.C.section 360bbb-3(b)(1), unless the authorization is terminated  or revoked sooner.       Influenza A by PCR NEGATIVE NEGATIVE Final   Influenza B by PCR NEGATIVE NEGATIVE Final    Comment: (NOTE) The Xpert Xpress SARS-CoV-2/FLU/RSV plus assay is intended as an aid in the diagnosis of influenza from Nasopharyngeal swab specimens and should not be used as a sole basis for treatment. Nasal washings and aspirates are unacceptable for Xpert Xpress SARS-CoV-2/FLU/RSV testing.  Fact Sheet for Patients: BloggerCourse.com  Fact Sheet for Healthcare Providers: SeriousBroker.it  This test is not yet approved or cleared by the [place]  FDA and has been authorized for detection and/or diagnosis of SARS-CoV-2 by FDA under an Emergency Use Authorization (EUA). This EUA will remain in effect (meaning this test can be used) for the duration of the COVID-19 declaration under Section 564(b)(1) of the Act, 21 U.S.C. section 360bbb-3(b)(1),  unless the authorization is terminated or revoked.     Resp Syncytial Virus by PCR NEGATIVE NEGATIVE Final    Comment: (NOTE) Fact Sheet for Patients: BloggerCourse.com  Fact Sheet for Healthcare Providers: SeriousBroker.it  This test is not yet approved or cleared by the [place]  FDA and has been authorized for detection and/or diagnosis of SARS-CoV-2 by FDA under an Emergency Use Authorization (EUA). This EUA will remain in effect (meaning this test can be used) for the duration of the COVID-19 declaration under Section  564(b)(1) of the Act, 21 U.S.C. section 360bbb-3(b)(1), unless the authorization is terminated or revoked.  Performed at [facility], [address] [place], [place] [address]    Imaging DG Chest Ebony 1 View Result Date: 12/20/2023 CLINICAL DATA:  Cough and shortness of breath EXAM: PORTABLE CHEST 1 VIEW COMPARISON:  11/14/2023 chest CT FINDINGS: Chronic lung disease with generalized interstitial coarsening and hazy density. Some [place]ley lines are present and there is chronic cardiomegaly. Negative aortic and hilar contours. No pleural fluid or pneumothorax. IMPRESSION: Bilateral pulmonary opacity, likely edema superimposed on emphysema, similar to studies from last month. Electronically Signed   By: Ronnette Coke M.D.   On: 12/20/2023 05:43      Time coordinating discharge: over 30 minutes  SIGNED:  Lujean Ebright DO [facility]

## 2023-12-23 NOTE — Hospital Course (Signed)
 Hospital course / significant events:   HPI: Penny Gill is a 63 y.o. female with medical history significant of chronic HFrEF, COPD, cocaine abuse, hyperlipidemia, hypertension presented with acute respiratory failure with hypoxia, acute on chronic HFrEF, COPD exacerbation.  Patient reports increased work of breathing over the past 2 to 3 days.  Positive cough and wheezing.  Also with orthopnea, PND.  Noted to have been admitted March 2025 for similar issues with new diagnosis of HFrEF associated with cocaine abuse.  Patient reports compliance with diuretic regimen at home.  Denies any high salt intake or recent NSAID use.  Patient states she smoked crack roughly 2 days ago.  Developed acute onset of shortness of breath   04/27: admitted to hospitalist service  04/28: cardiology consult. Switched to po diuretics, added beta blocker, given risk factors advised fro L/R cardiac cath  04/29: underwent cardiac catheterization today - normal coronaries consistent with nonischemic cardiomyopathy 04/30: *** discharge      Consultants:  Cardiology   Procedures/Surgeries: 12/22/2023 cardiac cath       ASSESSMENT & PLAN:   Acute respiratory failure with hypoxia  Decompensated resp status now requiring 3-4L [place] in setting of acute on chronic HFrEF, COPD,cocaine abuse  BNP 1612 Chest x-ray showing findings of fluid overload Diuresis discontinued by cardiology as currently patient is euvolemic Monitor input and output    Acute on chronic HFrEF (heart failure with reduced ejection fraction)  2D ECHO 10/2023 EF 30-35% and grade 1 diastolic dysfunction  Acute decompensated heart failure in setting of cocaine abuse  + vascular congestion and cardiomegaly on CXR  BNP 1612  Continue monitoring input and output Daily weighing Diuresis discontinued as patient is euvolemic Continue carvedilol, Farxiga, spironolactone  and losartan as recommended by cardiologist Patient counseled on cessation of  cocaine Patient underwent cardiac catheterization today 12/22/2023 by cardiologist that found normal coronaries consistent with nonischemic cardiomyopathy     COPD (chronic obstructive pulmonary disease) (HCC) Mild cough and wheezing w/ overlapping CHF exacerbation in setting of cocaine abuse  Continue steroid therapy Continue nebulization Continue azithromycin     Elevated troponin with normal coronaries Trop in 50s on presentation  Cardiologist on board This is likely secondary to cocaine use     Hyperlipemia Continue Lipitor   Hypertension BP stable  Titrate home regimen      Cocaine abuse (HCC) + cocaine abuse w/ noted secondary acute resp failure on 3-4L [place]  UDS showing cocaine Patient has been counseled on abstinence   Tobacco dependence Active tobacco abuse to which patient does not specify Discussed cessation Low-dose nicotine  patch in the interim Monitor   Prediabetes Monitor glucose level Currently on sliding scale insulin  therapy    *** based on BMI: Body mass index is 14.98 kg/m.Aaron Aas Significantly low or high BMI is associated with higher medical risk.  Underweight - under 18  overweight - 25 to 29 obese - 30 or more Class 1 obesity: BMI of 30.0 to 34 Class 2 obesity: BMI of 35.0 to 39 Class 3 obesity: BMI of 40.0 to 49 Super Morbid Obesity: BMI 50-59 Super-super Morbid Obesity: BMI 60+ Healthy nutrition and physical activity advised as adjunct to other disease management and risk reduction treatments    DVT prophylaxis: *** IV fluids: *** continuous IV fluids  Nutrition: *** Central lines / other devices: ***  Code Status: *** ACP documentation reviewed: *** none on file in [facility]  TOC needs: *** Medical barriers to dispo: ***. Expected medical readiness for discharge ***.

## 2023-12-23 NOTE — Plan of Care (Signed)
  Problem: Clinical Measurements: Goal: Ability to maintain clinical measurements within normal limits will improve Outcome: Progressing Goal: Will remain free from infection Outcome: Progressing Goal: Respiratory complications will improve Outcome: Progressing Goal: Cardiovascular complication will be avoided Outcome: Progressing   Problem: Activity: Goal: Risk for activity intolerance will decrease Outcome: Progressing   Problem: Nutrition: Goal: Adequate nutrition will be maintained Outcome: Progressing   Problem: Coping: Goal: Level of anxiety will decrease Outcome: Progressing   Problem: Elimination: Goal: Will not experience complications related to bowel motility Outcome: Progressing Goal: Will not experience complications related to urinary retention Outcome: Progressing   Problem: Pain Managment: Goal: General experience of comfort will improve and/or be controlled Outcome: Progressing   Problem: Safety: Goal: Ability to remain free from injury will improve Outcome: Progressing   Problem: Skin Integrity: Goal: Risk for impaired skin integrity will decrease Outcome: Progressing
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
## 2023-12-23 NOTE — Progress Notes (Signed)
 Rounding Note    Patient Name: Penny Gill Date of Encounter: 12/23/2023  [facility] Cardiologist: Antionette Kirks, MD   Subjective   Patient reports feeling well this morning and is looking forward to being discharged. She remains euvolemic. Denies chest pain and shortness of breath.   Inpatient Medications    Scheduled Meds:  atorvastatin   40 mg Oral QHS   carvedilol  3.125 mg Oral BID WC   dapagliflozin propanediol  10 mg Oral Daily   enoxaparin  (LOVENOX ) injection  30 mg Subcutaneous Q24H   feeding supplement  237 mL Oral BID BM   folic acid  1 mg Oral Daily   insulin  aspart  0-9 Units Subcutaneous TID WC   losartan  12.5 mg Oral Daily   multivitamin with minerals  1 tablet Oral Daily   nicotine   7 mg Transdermal Daily   predniSONE   40 mg Oral Q breakfast   sodium chloride  flush  3 mL Intravenous Q12H   sodium chloride  flush  3 mL Intravenous Q12H   spironolactone   12.5 mg Oral Daily   thiamine  100 mg Oral Daily   Or   thiamine  100 mg Intravenous Daily   Continuous Infusions:  sodium chloride      PRN Meds: sodium chloride , acetaminophen , albuterol , ondansetron  **OR** ondansetron  (ZOFRAN ) IV, sodium chloride  flush, sodium chloride  flush   Vital Signs    Vitals:   12/22/23 1943 12/23/23 0446 12/23/23 0503 12/23/23 0803  BP: 112/65 118/78  132/84  Pulse: 78 92  80  Resp: 18 16  16   Temp: 98.1 F (36.7 C) 98 F (36.7 C)  98.4 F (36.9 C)  TempSrc:  Oral  Oral  SpO2: 98% 100%  96%  Weight:   34.8 kg   Height:       No intake or output data in the 24 hours ending 12/23/23 0945    12/23/2023    5:03 AM 12/22/2023    6:34 AM 12/21/2023    3:24 AM  Last 3 Weights  Weight (lbs) 76 lb 11.2 oz 74 lb 8.3 oz 73 lb 9.6 oz  Weight (kg) 34.791 kg 33.8 kg 33.385 kg      Telemetry    Sinus rhythm with occasional PACs/PVCs - Personally Reviewed  Physical Exam   GEN: Frail appearing. No acute distress.   Neck: No JVD Cardiac: RRR, no murmurs,  rubs, or gallops.  Respiratory: Clear to auscultation bilaterally. GI: Soft, nontender, non-distended  MS: No edema; No deformity. Neuro:  Nonfocal  Psych: Normal affect   Labs    High Sensitivity Troponin:   Recent Labs  Lab 12/20/23 0513 12/20/23 0726 12/20/23 0912 12/20/23 1047  TROPONINIHS 52* 53* 61* 52*     Chemistry Recent Labs  Lab 12/20/23 0513 12/21/23 0450 12/22/23 0446 12/22/23 1058 12/22/23 1102 12/23/23 0510  NA 140 136 135 140 139 129*  K 3.6 3.8 3.8 4.0 4.0 4.2  CL 103 103 101  --   --  97*  CO2 26 24 27   --   --  21*  GLUCOSE 137* 229* 95  --   --  90  BUN 16 30* 43*  --   --  42*  CREATININE 0.86 0.85 0.93  --   --  0.67  CALCIUM  9.2 9.0 9.0  --   --  8.0*  MG  --   --  2.1  --   --   --   PROT 7.4 6.6  --   --   --   --  ALBUMIN 3.9 3.3*  --   --   --   --   AST 36 21  --   --   --   --   ALT 33 21  --   --   --   --   ALKPHOS 91 71  --   --   --   --   BILITOT 0.7 0.8  --   --   --   --   GFRNONAA >60 >60 >60  --   --  >60  ANIONGAP 11 9 7   --   --  11    Lipids No results for input(s): "CHOL", "TRIG", "HDL", "LABVLDL", "LDLCALC", "CHOLHDL" in the last 168 hours.  Hematology Recent Labs  Lab 12/21/23 0450 12/22/23 0446 12/22/23 1058 12/22/23 1102 12/23/23 0510  WBC 9.7 14.0*  --   --  12.2*  RBC 4.16 4.41  --   --  4.18  HGB 12.5 13.3 13.6 13.6 12.7  HCT 36.2 38.9 40.0 40.0 37.1  MCV 87.0 88.2  --   --  88.8  MCH 30.0 30.2  --   --  30.4  MCHC 34.5 34.2  --   --  34.2  RDW 11.9 11.9  --   --  12.0  PLT 349 398  --   --  362   Thyroid  No results for input(s): "TSH", "FREET4" in the last 168 hours.  BNP Recent Labs  Lab 12/20/23 0513  BNP 1,612.1*    DDimer No results for input(s): "DDIMER" in the last 168 hours.    Cardiac Studies   12/22/2023 R/LHC No angiographically significant coronary artery disease.  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 Normal left and right heart filling pressures (RA 3, RV  26/3, PCWP 8, LVEDP 10 mmHg). Upper normal pulmonary artery pressure with elevated vascular resistance (PA 27/14, mean 18 mmHg; PVR 4 WU). Mildly reduced Fick cardiac index (CO 2.5 L/min, CI 2.1 L/min/m^2).   Recommendations: Maintain net even to slightly positive fluid balance over next 24 hours. Escalate goal-directed medical therapy as tolerated; will add losartan 12.5 mg daily.  11/15/2023 Echo complete 1. Left ventricular ejection fraction, by estimation, is 30 to 35%. The  left ventricle has moderate to severely decreased function. The left  ventricle demonstrates global hypokinesis. The left ventricular internal  cavity size was mildly dilated. There  is mild left ventricular hypertrophy. Left ventricular diastolic  parameters are consistent with Grade I diastolic dysfunction (impaired  relaxation).   2. Right ventricular systolic function is normal. The right ventricular  size is normal.   3. The mitral valve is normal in structure. Mild mitral valve  regurgitation.   4. The aortic valve is tricuspid. Aortic valve regurgitation is not  visualized.   5. The inferior vena cava is normal in size with greater than 50%  respiratory variability, suggesting right atrial pressure of 3 mmHg.   Patient Profile     63 y.o. female  with a hx of cocaine and tobacco abuse, hypertension, hyperlipidemia, diabetes, COPD not on O2, depression, chronic pain, and sickle cell trait, admitted 4/27 with 2-3 days of shortness of breath. Cardiology was asked to consult for acute on chronic HFrEF.   Assessment & Plan    Acute on chronic HFrEF - Recent hospitalization 10/2023 with echo showing EF 30 to 35%, global hypokinesis mild LVH, G1 DD, mild MR. Unknown chronicity of reduced EF with no prior echo on file. 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 Underwent IV  diuresis and was discharged on spironolactone  and Lasix .  - Presented 4/27 with 2-3 days of worsening dyspnea, orthopnea, PND, and LE  edema - S/p IV Lasix  80 mg, 60 mg x2 with I/Os poorly recorded - BUN worsening, will continue to hold Lasix  today - Cardiac catheterization showed normal R/L heart filling pressures with upper normal pulmonary artery pressure with elevated vascular resistance. No significant CAD.  - Continue spironolactone , carvedilol, Farxiga, and losartan - Will plan to discharge with Lasix  20 mg as needed for lower extremity swelling  Elevated troponin - Troponin mildly elevated and flat trending  - Prior echo 10/2023 with EF 30-35%, global hypokinesis - Denies chest pain - EKG without acute ischemic changes - LHC done yesterday showed no significant CAD - Suspect demand ischemia in the setting of volume overload and ongoing cocaine use  Acute hypoxic respiratory failure - Resolved, now on room air - Likely combined etiology of COPD and acute on chronic HFrEF - Nebulizer and steroids per IM  COPD - Improved - Nebulizer, antibiotics, and steroids per IM  Hyperlipidemia - Most recent lipid panel 10/2023 with LDL 167 - Continue atorvastatin  40 mg daily  Hypertension - BP labile during admission - Continue spironolactone , carvedilol, and Farxiga as above  Cocaine abuse - UDS positive for cocaine - Encouraged abstinence and patient is in agreement. She is interested in going to a facility to help her stop using  For questions or updates, please contact [facility] Please consult [email or web address] for contact info under        Signed, Brodie Cannon, PA-C  12/23/2023, 9:45 AM

## 2023-12-25 ENCOUNTER — Telehealth: Payer: Self-pay | Admitting: Cardiology

## 2023-12-25 LAB — LIPOPROTEIN A (LPA): Lipoprotein (a): 227.7 nmol/L — ABNORMAL HIGH (ref <75.0)
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
## 2023-12-25 NOTE — Telephone Encounter (Signed)
 Called to confirm/remind patient of their appointment at the [facility] Failure Clinic on 12/28/23.   Appointment:   [] Confirmed  [x] Left mess   [] No answer/No voice mail  [] VM Full/unable to leave message  [] Phone not in service  Patient reminded to bring all medications and/or complete list.  Confirmed patient has transportation. Gave directions, instructed to utilize valet parking.

## 2023-12-28 ENCOUNTER — Other Ambulatory Visit
Admission: RE | Admit: 2023-12-28 | Discharge: 2023-12-28 | Disposition: A | Discharge status: Home / Self Care | Payer: MEDICAID | Source: Ambulatory Visit | Attending: Family | Admitting: Family

## 2023-12-28 ENCOUNTER — Ambulatory Visit (HOSPITAL_BASED_OUTPATIENT_CLINIC_OR_DEPARTMENT_OTHER): Payer: MEDICAID | Admitting: Family

## 2023-12-28 ENCOUNTER — Encounter: Payer: Self-pay | Admitting: Family

## 2023-12-28 VITALS — BP 98/80 | HR 86 | Wt 78.2 lb

## 2023-12-28 DIAGNOSIS — D573 Sickle-cell trait: Secondary | ICD-10-CM | POA: Insufficient documentation

## 2023-12-28 DIAGNOSIS — J439 Emphysema, unspecified: Secondary | ICD-10-CM | POA: Diagnosis not present

## 2023-12-28 DIAGNOSIS — F17210 Nicotine dependence, cigarettes, uncomplicated: Secondary | ICD-10-CM | POA: Diagnosis not present

## 2023-12-28 DIAGNOSIS — R0683 Snoring: Secondary | ICD-10-CM | POA: Diagnosis not present

## 2023-12-28 DIAGNOSIS — Z5986 Financial insecurity: Secondary | ICD-10-CM | POA: Insufficient documentation

## 2023-12-28 DIAGNOSIS — I10 Essential (primary) hypertension: Secondary | ICD-10-CM | POA: Insufficient documentation

## 2023-12-28 DIAGNOSIS — G4719 Other hypersomnia: Secondary | ICD-10-CM

## 2023-12-28 DIAGNOSIS — Z7984 Long term (current) use of oral hypoglycemic drugs: Secondary | ICD-10-CM | POA: Diagnosis not present

## 2023-12-28 DIAGNOSIS — I428 Other cardiomyopathies: Secondary | ICD-10-CM | POA: Diagnosis not present

## 2023-12-28 DIAGNOSIS — E1140 Type 2 diabetes mellitus with diabetic neuropathy, unspecified: Secondary | ICD-10-CM | POA: Diagnosis not present

## 2023-12-28 DIAGNOSIS — E785 Hyperlipidemia, unspecified: Secondary | ICD-10-CM | POA: Insufficient documentation

## 2023-12-28 DIAGNOSIS — I5022 Chronic systolic (congestive) heart failure: Secondary | ICD-10-CM

## 2023-12-28 DIAGNOSIS — J449 Chronic obstructive pulmonary disease, unspecified: Secondary | ICD-10-CM | POA: Insufficient documentation

## 2023-12-28 DIAGNOSIS — F17290 Nicotine dependence, other tobacco product, uncomplicated: Secondary | ICD-10-CM | POA: Diagnosis not present

## 2023-12-28 DIAGNOSIS — R5383 Other fatigue: Secondary | ICD-10-CM | POA: Diagnosis present

## 2023-12-28 DIAGNOSIS — Z79899 Other long term (current) drug therapy: Secondary | ICD-10-CM | POA: Insufficient documentation

## 2023-12-28 DIAGNOSIS — F1910 Other psychoactive substance abuse, uncomplicated: Secondary | ICD-10-CM

## 2023-12-28 LAB — BASIC METABOLIC PANEL WITH GFR
Anion gap: 9 (ref 5–15)
BUN: 21 mg/dL (ref 8–23)
CO2: 27 mmol/L (ref 22–32)
Calcium: 9.1 mg/dL (ref 8.9–10.3)
Chloride: 101 mmol/L (ref 98–111)
Creatinine, Ser: 0.85 mg/dL (ref 0.44–1.00)
GFR, Estimated: >60 mL/min (ref >60)
Glucose, Bld: 143 mg/dL — ABNORMAL HIGH (ref 70–99)
Potassium: 3.5 mmol/L (ref 3.5–5.1)
Sodium: 137 mmol/L (ref 135–145)

## 2023-12-28 NOTE — Progress Notes (Signed)
 [facility] - HEART FAILURE CLINIC - PHARMACIST COUNSELING NOTE  Guideline-Directed Medical Therapy/Evidence Based Medicine  ACE/ARB/ARNI: Losartan  25 mg daily Beta Blocker: Carvedilol  3.125 mg twice daily Aldosterone Antagonist: Spironolactone  12.5 mg daily Diuretic: Furosemide  20 mg daily SGLT2i: Dapagliflozin  10 mg daily  Adherence Assessment  Adherence strategy: Patient has medication in a bag  Barriers to obtaining medications: They receive their medications at [facility] pharmacy   Vital signs: HR 86, BP 98/80, weight (pounds) 78.2 lbs Renal Function: GFR > 60 12/23/23 ECHO 10/2023 EF 30-35% and grade 1 diastolic dysfunction      Latest Ref Rng & Units 12/23/2023    5:10 AM 12/22/2023   11:02 AM 12/22/2023   10:58 AM  BMP  Glucose 70 - 99 mg/dL 90     BUN 8 - 23 mg/dL 42     Creatinine 1.61 - 1.00 mg/dL 0.96     Sodium 045 - 409 mmol/L 129  139  140   Potassium 3.5 - 5.1 mmol/L 4.2  4.0  4.0   Chloride 98 - 111 mmol/L 97     CO2 22 - 32 mmol/L 21     Calcium  8.9 - 10.3 mg/dL 8.0      Past Medical History:  Diagnosis Date   COPD (chronic obstructive pulmonary disease) (HCC)    emphysema   Depression    Hyperlipidemia    Hypertension    Kidney stone    Osteoporosis    Sickle cell trait (HCC)    ASSESSMENT SP is a 63 year old female who presents to the HF clinic for a hospital follow-up appointment. They have a past medical history significant for cocaine and tobacco abuse, HTN, HLD, DM, COPD not on O2, depression, chronic pain, sickle cell trait. They presented to [facility] on 4/27 due to increased shortness of breath over the last 2-3 days. Newly diagnosed with HFrEF in 03/25 secondary to cocaine use.   Medications: They are on all components of GDMT except for entresto. Their BP is  soft but at goal< 130/80. Room to up-titrate spironolactone , coreg  and switch losartan  to Entresto (co pay $4) as indicated but only if BP are able to tolerate in the future.    Symptoms: Patient was unable to stay awake during our conversation. On several occasions they fell asleep and started snoring while reviewing their medications and symptoms.   Recent ED Visit (past 6 months):  -- Date - 12/20/2023, [facility] - Fluid overload due to missing medications  PLAN  Prevention LDL 167 11/14/23 -- atorvastatin  40 mg daily  A1c 5.8 11/15/23 -- dapagliflozin  10 mg daily  Recommendations -- Repeat LDL in June 2025 and consider adding an additional agent if indicated -- Continue current regimen per NP  Thank you for allowing pharmacy to participate in this patient's care   Time spent: 15 minutes  Craven Do, PharmD Pharmacy Resident  12/28/2023 12:06 PM

## 2023-12-28 NOTE — Patient Instructions (Signed)
 Medication Changes:  No medication changes today!  Lab Work:  Go over to the [facility]. Go pass the gift shop and have your blood work completed.  We will only call you if the results are abnormal or if the provider would like to make medication changes.   Special Instructions // Education:  You have been recommended for an in facility sleep study. They will contact you in order to set up your test.  Follow-Up in: Please follow up with the [facility] in 4 weeks.   At the [facility], you and your health needs are our priority. We have a designated team specialized in the treatment of Heart Failure. This Care Team includes your primary Heart Failure Specialized Cardiologist (physician), Advanced Practice Providers (APPs- Physician Assistants and Nurse Practitioners), and Pharmacist who all work together to provide you with the care you need, when you need it.   You may see any of the following providers on your designated Care Team at your next follow up:  Dr. Jules Oar Dr. Peder Bourdon Dr. Alwin Baars Dr. Judyth Nunnery Shawnee Dellen, FNP Bevely Brush, RPH-CPP  Please be sure to bring in all your medications bottles to every appointment.   Need to Contact Us :  If you have any questions or concerns before your next appointment please send us  a message through [place] or call our office at [phone].    TO LEAVE A MESSAGE FOR THE NURSE SELECT OPTION 2, PLEASE LEAVE A MESSAGE INCLUDING: YOUR NAME DATE OF BIRTH CALL BACK NUMBER REASON FOR CALL**this is important as we prioritize the call backs  YOU WILL RECEIVE A CALL BACK THE SAME DAY AS LONG AS YOU CALL BEFORE 4:00 PM

## 2024-01-05 ENCOUNTER — Inpatient Hospital Stay
Admission: EM | Admit: 2024-01-05 | Discharge: 2024-01-12 | DRG: 291 | Disposition: A | Discharge status: Home / Self Care | Payer: MEDICAID | Attending: Internal Medicine | Admitting: Internal Medicine

## 2024-01-05 ENCOUNTER — Other Ambulatory Visit: Payer: Self-pay

## 2024-01-05 ENCOUNTER — Emergency Department: Payer: MEDICAID

## 2024-01-05 DIAGNOSIS — Z7982 Long term (current) use of aspirin: Secondary | ICD-10-CM

## 2024-01-05 DIAGNOSIS — I420 Dilated cardiomyopathy: Secondary | ICD-10-CM | POA: Diagnosis present

## 2024-01-05 DIAGNOSIS — Z681 Body mass index (BMI) 19 or less, adult: Secondary | ICD-10-CM | POA: Diagnosis not present

## 2024-01-05 DIAGNOSIS — N179 Acute kidney failure, unspecified: Secondary | ICD-10-CM | POA: Diagnosis not present

## 2024-01-05 DIAGNOSIS — Z5986 Financial insecurity: Secondary | ICD-10-CM

## 2024-01-05 DIAGNOSIS — J9601 Acute respiratory failure with hypoxia: Secondary | ICD-10-CM | POA: Diagnosis present

## 2024-01-05 DIAGNOSIS — R636 Underweight: Secondary | ICD-10-CM | POA: Diagnosis present

## 2024-01-05 DIAGNOSIS — E861 Hypovolemia: Secondary | ICD-10-CM | POA: Diagnosis present

## 2024-01-05 DIAGNOSIS — E876 Hypokalemia: Secondary | ICD-10-CM | POA: Diagnosis not present

## 2024-01-05 DIAGNOSIS — R579 Shock, unspecified: Secondary | ICD-10-CM | POA: Diagnosis not present

## 2024-01-05 DIAGNOSIS — E872 Acidosis, unspecified: Secondary | ICD-10-CM | POA: Diagnosis not present

## 2024-01-05 DIAGNOSIS — D72829 Elevated white blood cell count, unspecified: Secondary | ICD-10-CM | POA: Diagnosis present

## 2024-01-05 DIAGNOSIS — Z79899 Other long term (current) drug therapy: Secondary | ICD-10-CM

## 2024-01-05 DIAGNOSIS — J439 Emphysema, unspecified: Secondary | ICD-10-CM | POA: Diagnosis present

## 2024-01-05 DIAGNOSIS — I2489 Other forms of acute ischemic heart disease: Secondary | ICD-10-CM | POA: Diagnosis present

## 2024-01-05 DIAGNOSIS — B3731 Acute candidiasis of vulva and vagina: Secondary | ICD-10-CM | POA: Diagnosis present

## 2024-01-05 DIAGNOSIS — I428 Other cardiomyopathies: Secondary | ICD-10-CM | POA: Diagnosis present

## 2024-01-05 DIAGNOSIS — Z91199 Patient's noncompliance with other medical treatment and regimen due to unspecified reason: Secondary | ICD-10-CM

## 2024-01-05 DIAGNOSIS — Z803 Family history of malignant neoplasm of breast: Secondary | ICD-10-CM

## 2024-01-05 DIAGNOSIS — R571 Hypovolemic shock: Secondary | ICD-10-CM | POA: Diagnosis not present

## 2024-01-05 DIAGNOSIS — F17290 Nicotine dependence, other tobacco product, uncomplicated: Secondary | ICD-10-CM | POA: Diagnosis present

## 2024-01-05 DIAGNOSIS — I5023 Acute on chronic systolic (congestive) heart failure: Secondary | ICD-10-CM | POA: Diagnosis present

## 2024-01-05 DIAGNOSIS — F17210 Nicotine dependence, cigarettes, uncomplicated: Secondary | ICD-10-CM | POA: Diagnosis present

## 2024-01-05 DIAGNOSIS — J189 Pneumonia, unspecified organism: Secondary | ICD-10-CM | POA: Diagnosis present

## 2024-01-05 DIAGNOSIS — I340 Nonrheumatic mitral (valve) insufficiency: Secondary | ICD-10-CM | POA: Diagnosis present

## 2024-01-05 DIAGNOSIS — I427 Cardiomyopathy due to drug and external agent: Secondary | ICD-10-CM | POA: Diagnosis present

## 2024-01-05 DIAGNOSIS — R197 Diarrhea, unspecified: Secondary | ICD-10-CM | POA: Diagnosis not present

## 2024-01-05 DIAGNOSIS — R7989 Other specified abnormal findings of blood chemistry: Secondary | ICD-10-CM | POA: Diagnosis not present

## 2024-01-05 DIAGNOSIS — E782 Mixed hyperlipidemia: Secondary | ICD-10-CM | POA: Diagnosis present

## 2024-01-05 DIAGNOSIS — Z87442 Personal history of urinary calculi: Secondary | ICD-10-CM

## 2024-01-05 DIAGNOSIS — I110 Hypertensive heart disease with heart failure: Principal | ICD-10-CM | POA: Diagnosis present

## 2024-01-05 DIAGNOSIS — F1410 Cocaine abuse, uncomplicated: Secondary | ICD-10-CM | POA: Diagnosis present

## 2024-01-05 DIAGNOSIS — J441 Chronic obstructive pulmonary disease with (acute) exacerbation: Secondary | ICD-10-CM | POA: Diagnosis present

## 2024-01-05 DIAGNOSIS — I5043 Acute on chronic combined systolic (congestive) and diastolic (congestive) heart failure: Secondary | ICD-10-CM | POA: Diagnosis present

## 2024-01-05 DIAGNOSIS — R578 Other shock: Secondary | ICD-10-CM | POA: Diagnosis not present

## 2024-01-05 DIAGNOSIS — E119 Type 2 diabetes mellitus without complications: Secondary | ICD-10-CM | POA: Diagnosis present

## 2024-01-05 DIAGNOSIS — M810 Age-related osteoporosis without current pathological fracture: Secondary | ICD-10-CM | POA: Diagnosis present

## 2024-01-05 DIAGNOSIS — I959 Hypotension, unspecified: Secondary | ICD-10-CM | POA: Diagnosis not present

## 2024-01-05 DIAGNOSIS — I4581 Long QT syndrome: Secondary | ICD-10-CM | POA: Diagnosis not present

## 2024-01-05 DIAGNOSIS — D573 Sickle-cell trait: Secondary | ICD-10-CM | POA: Diagnosis present

## 2024-01-05 DIAGNOSIS — R011 Cardiac murmur, unspecified: Secondary | ICD-10-CM | POA: Diagnosis not present

## 2024-01-05 LAB — BASIC METABOLIC PANEL WITH GFR
Anion gap: 14 (ref 5–15)
BUN: 22 mg/dL (ref 8–23)
CO2: 22 mmol/L (ref 22–32)
Calcium: 9.2 mg/dL (ref 8.9–10.3)
Chloride: 106 mmol/L (ref 98–111)
Creatinine, Ser: 0.91 mg/dL (ref 0.44–1.00)
GFR, Estimated: >60 mL/min (ref >60)
Glucose, Bld: 105 mg/dL — ABNORMAL HIGH (ref 70–99)
Potassium: 3.5 mmol/L (ref 3.5–5.1)
Sodium: 142 mmol/L (ref 135–145)

## 2024-01-05 LAB — BRAIN NATRIURETIC PEPTIDE: B Natriuretic Peptide: 1852.3 pg/mL — ABNORMAL HIGH (ref 0.0–100.0)

## 2024-01-05 LAB — CBC
HCT: 37.8 % (ref 36.0–46.0)
Hemoglobin: 12.5 g/dL (ref 12.0–15.0)
MCH: 29.5 pg (ref 26.0–34.0)
MCHC: 33.1 g/dL (ref 30.0–36.0)
MCV: 89.2 fL (ref 80.0–100.0)
Platelets: 307 K/uL (ref 150–400)
RBC: 4.24 MIL/uL (ref 3.87–5.11)
RDW: 12.5 % (ref 11.5–15.5)
WBC: 14.9 K/uL — ABNORMAL HIGH (ref 4.0–10.5)
nRBC: 0 % (ref 0.0–0.2)

## 2024-01-05 LAB — TROPONIN I (HIGH SENSITIVITY)
Troponin I (High Sensitivity): 235 ng/L (ref <18)
Troponin I (High Sensitivity): 380 ng/L (ref <18)

## 2024-01-05 LAB — PROCALCITONIN: Procalcitonin: 0.1 ng/mL

## 2024-01-05 MED ORDER — SPIRONOLACTONE 12.5 MG HALF TABLET
12.5000 mg | ORAL_TABLET | Freq: Every day | ORAL | Status: DC
Start: 2024-01-06 — End: 2024-01-08
  Administered 2024-01-06 – 2024-01-07 (×2): 12.5 mg via ORAL
  Filled 2024-01-05 (×2): qty 1

## 2024-01-05 MED ORDER — DULOXETINE HCL 30 MG PO CPEP
30.0000 mg | ORAL_CAPSULE | Freq: Every day | ORAL | Status: DC
  Administered 2024-01-06 – 2024-01-12 (×7): 30 mg via ORAL
  Filled 2024-01-05 (×7): qty 1

## 2024-01-05 MED ORDER — SODIUM CHLORIDE 0.9% FLUSH: 3.0000 mL | INTRAVENOUS | Status: DC | PRN

## 2024-01-05 MED ORDER — LOSARTAN POTASSIUM 25 MG PO TABS
12.5000 mg | ORAL_TABLET | Freq: Every day | ORAL | Status: DC
  Administered 2024-01-06 – 2024-01-07 (×2): 12.5 mg via ORAL
  Filled 2024-01-05: qty 0.5
  Filled 2024-01-05: qty 1

## 2024-01-05 MED ORDER — IPRATROPIUM-ALBUTEROL 0.5-2.5 (3) MG/3ML IN SOLN
3.0000 mL | Freq: Once | RESPIRATORY_TRACT | Status: AC
  Administered 2024-01-05: 3 mL via RESPIRATORY_TRACT
  Filled 2024-01-05: qty 3

## 2024-01-05 MED ORDER — FUROSEMIDE 10 MG/ML IJ SOLN
40.0000 mg | Freq: Once | INTRAMUSCULAR | Status: AC
  Administered 2024-01-05: 40 mg via INTRAVENOUS
  Filled 2024-01-05: qty 4

## 2024-01-05 MED ORDER — CARVEDILOL 3.125 MG PO TABS
3.1250 mg | ORAL_TABLET | Freq: Two times a day (BID) | ORAL | Status: DC
Start: 2024-01-05 — End: 2024-01-08
  Administered 2024-01-05 – 2024-01-07 (×4): 3.125 mg via ORAL
  Filled 2024-01-05 (×5): qty 1

## 2024-01-05 MED ORDER — SODIUM CHLORIDE 0.9% FLUSH
3.0000 mL | Freq: Two times a day (BID) | INTRAVENOUS | Status: DC
  Administered 2024-01-05 – 2024-01-12 (×14): 3 mL via INTRAVENOUS

## 2024-01-05 MED ORDER — FUROSEMIDE 10 MG/ML IJ SOLN
40.0000 mg | Freq: Two times a day (BID) | INTRAMUSCULAR | Status: DC
  Administered 2024-01-06 – 2024-01-07 (×3): 40 mg via INTRAVENOUS
  Filled 2024-01-05 (×4): qty 4

## 2024-01-05 MED ORDER — DAPAGLIFLOZIN PROPANEDIOL 10 MG PO TABS
10.0000 mg | ORAL_TABLET | Freq: Every day | ORAL | Status: DC
  Administered 2024-01-06 – 2024-01-07 (×2): 10 mg via ORAL
  Filled 2024-01-05 (×2): qty 1

## 2024-01-05 MED ORDER — ATORVASTATIN CALCIUM 20 MG PO TABS
40.0000 mg | ORAL_TABLET | Freq: Every day | ORAL | Status: DC
  Administered 2024-01-05 – 2024-01-11 (×7): 40 mg via ORAL
  Filled 2024-01-05 (×7): qty 2

## 2024-01-05 MED ORDER — ONDANSETRON HCL 4 MG/2ML IJ SOLN
4.0000 mg | Freq: Four times a day (QID) | INTRAMUSCULAR | Status: DC | PRN
  Administered 2024-01-07: 4 mg via INTRAVENOUS
  Filled 2024-01-05: qty 2

## 2024-01-05 MED ORDER — ASPIRIN 325 MG PO TABS
325.0000 mg | ORAL_TABLET | Freq: Once | ORAL | Status: AC
  Administered 2024-01-05: 325 mg via ORAL
  Filled 2024-01-05: qty 1

## 2024-01-05 MED ORDER — SODIUM CHLORIDE 0.9 % IV SOLN
1.0000 g | INTRAVENOUS | Status: DC
  Administered 2024-01-06 – 2024-01-10 (×6): 1 g via INTRAVENOUS
  Filled 2024-01-05 (×7): qty 10

## 2024-01-05 MED ORDER — ENOXAPARIN SODIUM 30 MG/0.3ML IJ SOSY
30.0000 mg | PREFILLED_SYRINGE | INTRAMUSCULAR | Status: DC
  Administered 2024-01-06 – 2024-01-12 (×7): 30 mg via SUBCUTANEOUS
  Filled 2024-01-05 (×7): qty 0.3

## 2024-01-05 MED ORDER — SODIUM CHLORIDE 0.9 % IV SOLN: 250.0000 mL | INTRAVENOUS | Status: AC | PRN

## 2024-01-05 MED ORDER — SODIUM CHLORIDE 0.9 % IV SOLN: 1.0000 g | INTRAVENOUS | Status: DC

## 2024-01-05 MED ORDER — ACETAMINOPHEN 325 MG PO TABS
650.0000 mg | ORAL_TABLET | ORAL | Status: DC | PRN
  Administered 2024-01-05 – 2024-01-06 (×2): 650 mg via ORAL
  Filled 2024-01-05 (×2): qty 2

## 2024-01-05 NOTE — ED Triage Notes (Signed)
 Pt BIB [facility] from Home, is a Licensed conveyancer at [employer]. Pt complaining of SOB,was given 125 solu-medrol , 1 albuterol , and 2x duoneb en route with EMS. Pt Hx of COPD, HTN, DM. EMS reports pt doing much better at this time. Pt also needing neck surgery, stating can't turn head to the right.

## 2024-01-05 NOTE — ED Notes (Signed)
 Pt ambulatory to restroom

## 2024-01-05 NOTE — ED Notes (Signed)
 Pt stating she is feeling better but very sleepy. Pt falling asleep during blood draw.

## 2024-01-05 NOTE — ED Notes (Signed)
 EMS IV flushes well but does not pull back blood. This RN attempted 2x IV without success and attempted butterfly stick without success. IV team consulted.

## 2024-01-05 NOTE — ED Notes (Signed)
 Pt standing out of bed yelling stating she needed to void. Pt disconnected from monitor and was able to walk without assistance to toilet. Pt reconnected to monitor upon return to bed.

## 2024-01-05 NOTE — ED Provider Notes (Signed)
 [facility] Provider Note    Event Date/Time   First MD Initiated Contact with Patient 01/05/24 1620     (approximate)   History   Respiratory Distress   HPI  Penny Gill is a 63 y.o. female with a history of relatively new CHF, cardiomyopathy, COPD  Patient reports 2 days of increasing shortness of breath worsening once again.  No fevers or chills.  Reports that she has had symptoms for about 2 days in addition she has noticed swelling in both of her legs.  She reports it feels very similar to when she was last hospitalized.  She reports to use medication and has access to it consistent with her prior discharge.  She has not had any sort of cough but reports she was wheezing earlier.  When she called EMS her symptoms got a lot better after they gave her medication  EMS gave the patient Solu-Medrol , DuoNeb x 2     Physical Exam   Triage Vital Signs: ED Triage Vitals  Encounter Vitals Group     BP 01/05/24 1248 (!) 170/112     Systolic BP Percentile --      Diastolic BP Percentile --      Pulse Rate 01/05/24 1248 (!) 128     Resp 01/05/24 1248 (!) 29     Temp 01/05/24 1248 98.1 F (36.7 C)     Temp Source 01/05/24 1248 Oral     SpO2 01/05/24 1248 (!) 88 %     Weight 01/05/24 1249 78 lb 4.2 oz (35.5 kg)     Height 01/05/24 1249 5' (1.524 m)     Head Circumference --      Peak Flow --      Pain Score 01/05/24 1249 0     Pain Loc --      Pain Education --      Exclude from Growth Chart --     Most recent vital signs: Vitals:   01/05/24 1645 01/05/24 1910  BP:  (!) 142/96  Pulse: (!) 112 (!) 128  Resp: (!) 22 (!) 22  Temp:  (!) 100.4 F (38 C)  SpO2: 97% 99%     General: Awake, no distress.  Appears mildly tachypneic but in no acute distress reports he feels much improved after EMS treatment CV:  Good peripheral perfusion.  Normal tones Resp:  Normal effort mild accessory muscle use.  Diminished lung sounds throughout, slight  rhonchi.  No tripoding or obvious distress or extremis Abd:  No distention.  Other:  Trace bilateral lower extremity edema   ED Results / Procedures / Treatments   Labs (all labs ordered are listed, but only abnormal results are displayed) Labs Reviewed  BASIC METABOLIC PANEL WITH GFR - Abnormal; Notable for the following components:      Result Value   Glucose, Bld 105 (*)    All other components within normal limits  CBC - Abnormal; Notable for the following components:   WBC 14.9 (*)    All other components within normal limits  BRAIN NATRIURETIC PEPTIDE - Abnormal; Notable for the following components:   B Natriuretic Peptide 1,852.3 (*)    All other components within normal limits  TROPONIN I (HIGH SENSITIVITY) - Abnormal; Notable for the following components:   Troponin I (High Sensitivity) 235 (*)    All other components within normal limits  TROPONIN I (HIGH SENSITIVITY) - Abnormal; Notable for the following components:   Troponin I (High Sensitivity)  380 (*)    All other components within normal limits  PROCALCITONIN  BASIC METABOLIC PANEL WITH GFR  MAGNESIUM    Labs notable for leukocytosis.  At the time of signout pending labs included BNP troponin and procalcitonin.  Dr. Synetta Eves to follow-up on these.  EKG  Interpreted by me.  No STEMI  Sinus tachycardia with left ventricular hypertrophy and repolarization abnormality.  No evidence to suggest STEMI   RADIOLOGY  Chest x-ray interpreted by me is concerning for lower lobe infiltrative process or edema  DG Chest 2 View Result Date: 01/05/2024 CLINICAL DATA:  Shortness of breath EXAM: CHEST - 2 VIEW COMPARISON:  X-ray 12/20/2023 FINDINGS: Stable cardiopericardial silhouette. No pneumothorax or effusion. Prominent central vasculature. Increasing lung base parenchymal interstitial opacities. Acute infiltrate is possible. Overlapping cardiac leads. Degenerative changes of the spine. IMPRESSION: Increasing lung base  parenchymal interstitial opacities. Acute process is possible. Recommend follow-up. Electronically Signed   By: Adrianna Horde M.D.   On: 01/05/2024 14:25      PROCEDURES:  Critical Care performed: No  Procedures   MEDICATIONS ORDERED IN ED: Medications  atorvastatin  (LIPITOR) tablet 40 mg (has no administration in time range)  carvedilol  (COREG ) tablet 3.125 mg (has no administration in time range)  losartan  (COZAAR ) tablet 12.5 mg (has no administration in time range)  spironolactone  (ALDACTONE ) tablet 12.5 mg (has no administration in time range)  DULoxetine (CYMBALTA) DR capsule 30 mg (has no administration in time range)  dapagliflozin  propanediol (FARXIGA ) tablet 10 mg (has no administration in time range)  sodium chloride  flush (NS) 0.9 % injection 3 mL (has no administration in time range)  sodium chloride  flush (NS) 0.9 % injection 3 mL (has no administration in time range)  0.9 %  sodium chloride  infusion (has no administration in time range)  acetaminophen  (TYLENOL ) tablet 650 mg (650 mg Oral Given 01/05/24 1924)  ondansetron  (ZOFRAN ) injection 4 mg (has no administration in time range)  enoxaparin  (LOVENOX ) injection 30 mg (has no administration in time range)  furosemide  (LASIX ) injection 40 mg (has no administration in time range)  ipratropium-albuterol  (DUONEB) 0.5-2.5 (3) MG/3ML nebulizer solution 3 mL (3 mLs Nebulization Given 01/05/24 1456)  aspirin  tablet 325 mg (325 mg Oral Given 01/05/24 1538)  furosemide  (LASIX ) injection 40 mg (40 mg Intravenous Given 01/05/24 1538)     IMPRESSION / MDM / ASSESSMENT AND PLAN / ED COURSE  I reviewed the triage vital signs and the nursing notes.                              Differential diagnosis includes, but is not limited to, possible COPD exacerbation with improved symptoms at this point as she reports she was wheezing quite symptomatic.  Still slightly symptomatic but overall she reports much improvement after EMS treatment  additionally she has noted to have peripheral edema history of congestive heart failure and is afebrile on presentation.  She does have leukocytosis on her labs, remaining labs including BNP troponin are pending.    After signout, labs further reviewed notable for a leukocytosis.  Her procalcitonin is relatively low, and she is not notably febrile.  Suspect her symptoms with her notably elevated BNP are likely driven by CHF exacerbation or potential multimodal such as COPD having been treated by AMS, and also potential for infection or pneumonia is considered but felt to be less likely at the time of signout to Dr. Synetta Eves.  I do anticipate the  patient will cover hospitalization.  She has had previous workup for pulmonary embolism presenting with similar symptomatology as she reports at this point I clinically do not have a high suspicion for PE as causation  Patient's presentation is most consistent with acute complicated illness / injury requiring diagnostic workup.          FINAL CLINICAL IMPRESSION(S) / ED DIAGNOSES   Final diagnoses:  Acute on chronic HFrEF (heart failure with reduced ejection fraction) (HCC)     Rx / DC Orders   ED Discharge Orders          Ordered    Amb Referral to HF Clinic        01/05/24 1731             Note:  This document was prepared using Dragon voice recognition software and may include unintentional dictation errors.   Iver Marker, MD 01/05/24 2002

## 2024-01-05 NOTE — ED Notes (Signed)
 Pt taken to XR.

## 2024-01-05 NOTE — H&P (Signed)
 History and Physical:    Penny Gill   ZOX:[id number] DOB: [birth year] DOA: 01/05/2024  Referring MD/provider: Claria Crofts, MD PCP: Pcp, No   Patient coming from: Home  Chief Complaint: Short of breath  History of Present Illness:   Penny Gill is a 63 y.o. female with medical history significant for chronic HFrEF (EF 30 to 35%), COPD, cocaine use disorder, hypertension, hyperlipidemia, who presented to the hospital with shortness of breath, leg swelling and weight gain.  She noticed swelling in her legs about 2 to 3 days prior to admission and also noticed weight gain.  She said "I could not see my ankles".  On the day of admission, she woke up around 11 AM feeling very short of breath.  She says she could not breathe so she came to the hospital for further management.  Symptoms were associated with cough and wheezing.  No chest pain, palpitations, dizziness, loss of consciousness.  ED Course: BP 98.1, respiratory 24, heart rate up to 120, BP 170/112, oxygen saturation 88% on 2 L.  The patient was given IV Lasix  40 mg in the ED.  She said her leg swelling has improved since she got IV Lasix .  Her breathing is also better.   ROS:   ROS all other systems reviewed were negative.  Past Medical History:   Past Medical History:  Diagnosis Date   COPD (chronic obstructive pulmonary disease) (HCC)    emphysema   Depression    Hyperlipidemia    Hypertension    Kidney stone    Osteoporosis    Sickle cell trait (HCC)     Past Surgical History:   Past Surgical History:  Procedure Laterality Date   RIGHT/LEFT HEART CATH AND CORONARY ANGIOGRAPHY N/A 12/22/2023   Procedure: RIGHT/LEFT HEART CATH AND CORONARY ANGIOGRAPHY;  Surgeon: Sammy Crisp, MD;  Location: [facility] INVASIVE CV LAB;  Service: Cardiovascular;  Laterality: N/A;   TUBAL LIGATION      Social History:   Social History   Socioeconomic History   Marital status: Married    Spouse name: Not on file   Number of  children: 2   Years of education: Not on file   Highest education level: Some college, no degree  Occupational History   Not on file  Tobacco Use   Smoking status: Every Day    Current packs/day: 0.20    Types: Cigarettes, E-cigarettes   Smokeless tobacco: Never   Tobacco comments:    I vape every now and then  Vaping Use   Vaping status: Never Used  Substance and Sexual Activity   Alcohol use: Not Currently    Comment: rare   Drug use: Not Currently    Types: Marijuana, Cocaine    Comment: History of cocaine abuse   Sexual activity: Not on file  Other Topics Concern   Not on file  Social History Narrative   Not on file   Social Drivers of Health   Financial Resource Strain: Medium Risk (12/22/2023)   Overall Financial Resource Strain (CARDIA)    Difficulty of Paying Living Expenses: Somewhat hard  Food Insecurity: No Food Insecurity (01/05/2024)   Hunger Vital Sign    Worried About Running Out of Food in the Last Year: Never true    Ran Out of Food in the Last Year: Never true  Transportation Needs: No Transportation Needs (01/05/2024)   PRAPARE - Administrator, Civil Service (Medical): No    Lack of Transportation (Non-Medical):  No  Physical Activity: Not on File (09/10/2021)   Received from [place], [place]   Physical Activity    Physical Activity: 0  Stress: Not on File (09/10/2021)   Received from [facility], [place]   Stress    Stress: 0  Social Connections: Unknown (01/05/2024)   Social Connection and Isolation Panel [NHANES]    Frequency of Communication with Friends and Family: Not on file    Frequency of Social Gatherings with Friends and Family: Not on file    Attends Religious Services: Not on file    Active Member of Clubs or Organizations: Not on file    Attends Banker Meetings: Not on file    Marital Status: Separated  Intimate Partner Violence: Not At Risk (01/05/2024)   Humiliation, Afraid, Rape, and Kick questionnaire    Fear of  Current or Ex-Partner: No    Emotionally Abused: No    Physically Abused: No    Sexually Abused: No    Allergies   Patient has no known allergies.  Family history:   Family History  Problem Relation Age of Onset   Breast cancer Maternal Aunt 40    Current Medications:   Prior to Admission medications   Medication Sig Start Date End Date Taking? Authorizing Provider  acetaminophen  (TYLENOL ) 325 MG tablet Take 2 tablets (650 mg total) by mouth every 6 (six) hours as needed for mild pain (pain score 1-3), moderate pain (pain score 4-6), fever or headache (or Fever >/= 101). 12/23/23   Alexander, Natalie, DO  aspirin  EC 81 MG tablet Take 1 tablet (81 mg total) by mouth daily. Swallow whole. 11/18/23   Montey Apa, DO  atorvastatin  (LIPITOR) 40 MG tablet Take 1 tablet (40 mg total) by mouth at bedtime. 11/17/23   Montey Apa, DO  carvedilol  (COREG ) 3.125 MG tablet Take 1 tablet (3.125 mg total) by mouth 2 (two) times daily with a meal. 12/23/23   Melodi Sprung, DO  dapagliflozin  propanediol (FARXIGA ) 10 MG TABS tablet Take 1 tablet (10 mg total) by mouth daily. 12/24/23   Alexander, Natalie, DO  DULoxetine (CYMBALTA) 30 MG capsule Take 1 capsule by mouth daily. 11/02/23 11/01/24  [provider]  feeding supplement (ENSURE ENLIVE / ENSURE PLUS) LIQD Take 237 mLs by mouth 2 (two) times daily between meals. Patient not taking: Reported on 12/28/2023 11/17/23   Darus Engels A, DO  folic acid  (FOLVITE ) 1 MG tablet Take 1 tablet (1 mg total) by mouth daily. 12/24/23   Alexander, Natalie, DO  furosemide  (LASIX ) 20 MG tablet Take 1 tablet (20 mg total) by mouth daily as needed for fluid. Weight > 3 lb in a 24 hour period. 12/23/23   Alexander, Natalie, DO  gabapentin  (NEURONTIN ) 300 MG capsule Take 300 mg by mouth 4 (four) times daily.    [provider]  losartan  (COZAAR ) 25 MG tablet Take 0.5 tablets (12.5 mg total) by mouth daily. 12/24/23   Alexander, Natalie, DO   Multiple Vitamin (MULTIVITAMIN WITH MINERALS) TABS tablet Take 1 tablet by mouth daily. Patient not taking: Reported on 12/28/2023 12/24/23   Alexander, Natalie, DO  nicotine  (NICODERM CQ  - DOSED IN MG/24 HR) 7 mg/24hr patch Place 1 patch (7 mg total) onto the skin daily. Patient not taking: Reported on 12/28/2023 12/24/23   Alexander, Natalie, DO  spironolactone  (ALDACTONE ) 25 MG tablet Take 0.5 tablets (12.5 mg total) by mouth daily. 11/18/23   Darus Engels A, DO  thiamine  (VITAMIN B-1) 100 MG tablet Take  1 tablet (100 mg total) by mouth daily. 12/24/23   Alexander, Natalie, DO  tiZANidine (ZANAFLEX) 2 MG tablet Take 2 mg by mouth every 8 (eight) hours as needed for muscle spasms.    [provider]    Physical Exam:   Vitals:   01/05/24 1520 01/05/24 1530 01/05/24 1600 01/05/24 1645  BP:  115/83 112/73   Pulse: (!) 105 (!) 106 (!) 103 (!) 112  Resp: 19 20 20  (!) 22  Temp:      TempSrc:      SpO2: 99% 94% 96% 97%  Weight:      Height:         Physical Exam: Blood pressure 112/73, pulse (!) 112, temperature 98.1 F (36.7 C), temperature source Oral, resp. rate (!) 22, height 5' (1.524 m), weight 35.5 kg, SpO2 97%. Gen: No acute distress. Head: Normocephalic, atraumatic. Eyes: Pupils equal, round and reactive to light. Extraocular movements intact.  Sclerae nonicteric.  Mouth: Moist mucous membranes Neck: Supple, no thyromegaly, no lymphadenopathy, no jugular venous distention. Chest: Bibasilar rales.  Mild wheezing on auscultation. CV: Heart sounds are regular with an S1, S2. No murmurs, rubs or gallops.  Abdomen: Soft, nontender, nondistended with normal active bowel sounds. No palpable masses. Extremities: Extremities are without clubbing, or cyanosis. No edema. Pedal pulses 2+.  Skin: Warm and dry. No rashes Neuro: Alert and oriented times 3; grossly nonfocal.  Psych: Insight is good and judgment is appropriate. Mood and affect normal.   Data Review:    Labs: Basic  Metabolic Panel: Recent Labs  Lab 01/05/24 1438  NA 142  K 3.5  CL 106  CO2 22  GLUCOSE 105*  BUN 22  CREATININE 0.91  CALCIUM  9.2   Liver Function Tests: No results for input(s): "AST", "ALT", "ALKPHOS", "BILITOT", "PROT", "ALBUMIN" in the last 168 hours. No results for input(s): "LIPASE", "AMYLASE" in the last 168 hours. No results for input(s): "AMMONIA" in the last 168 hours. CBC: Recent Labs  Lab 01/05/24 1438  WBC 14.9*  HGB 12.5  HCT 37.8  MCV 89.2  PLT 307   Cardiac Enzymes: No results for input(s): "CKTOTAL", "CKMB", "CKMBINDEX", "TROPONINI" in the last 168 hours.  BNP (last 3 results) No results for input(s): "PROBNP" in the last 8760 hours. CBG: No results for input(s): "GLUCAP" in the last 168 hours.  Urinalysis    Component Value Date/Time   COLORURINE YELLOW (A) 11/14/2023 2144   APPEARANCEUR HAZY (A) 11/14/2023 2144   APPEARANCEUR Hazy (A) 12/30/2019 1042   LABSPEC 1.015 11/14/2023 2144   LABSPEC 1.011 07/05/2014 1255   PHURINE 7.0 11/14/2023 2144   GLUCOSEU NEGATIVE 11/14/2023 2144   GLUCOSEU Negative 07/05/2014 1255   HGBUR NEGATIVE 11/14/2023 2144   BILIRUBINUR NEGATIVE 11/14/2023 2144   BILIRUBINUR Negative 12/30/2019 1042   BILIRUBINUR Negative 07/05/2014 1255   KETONESUR NEGATIVE 11/14/2023 2144   PROTEINUR 100 (A) 11/14/2023 2144   NITRITE NEGATIVE 11/14/2023 2144   LEUKOCYTESUR NEGATIVE 11/14/2023 2144   LEUKOCYTESUR 1+ 07/05/2014 1255      Radiographic Studies: DG Chest 2 View Result Date: 01/05/2024 CLINICAL DATA:  Shortness of breath EXAM: CHEST - 2 VIEW COMPARISON:  X-ray 12/20/2023 FINDINGS: Stable cardiopericardial silhouette. No pneumothorax or effusion. Prominent central vasculature. Increasing lung base parenchymal interstitial opacities. Acute infiltrate is possible. Overlapping cardiac leads. Degenerative changes of the spine. IMPRESSION: Increasing lung base parenchymal interstitial opacities. Acute process is possible.  Recommend follow-up. Electronically Signed   By: Otho Blitz.D.  On: 01/05/2024 14:25    EKG: Independently reviewed by me showed sinus tachycardia, LVH.    Assessment/Plan:   Principal Problem:   Acute on chronic HFrEF (heart failure with reduced ejection fraction) (HCC)   Acute on chronic HFrEF and HFpEF: Admit to progressive care unit.  Treat with IV Lasix .  Monitor BMP, daily weight and urine output. 2D echo in March 2025 showed EF estimated at 30 to 35%, grade 1 diastolic dysfunction.   Acute hypoxic respiratory failure: Improved after IV Lasix .  She is tolerating room air now.  Initial oxygen saturation was 88% on 2 L of oxygen.   Elevated troponins: Troponin to 35.  Repeat troponin is pending.  This is likely due to demand ischemia. Right and left heart cath in March 2025 showed normal coronaries.   Fever: Patient had a low grade fever  (100.4) while boarding in the ER.  Obtain blood cultures and urinalysis and start IV Ceftriaxone .   COPD: Stable: Bronchodilators as needed.   Cocaine use disorder: Counseled to quit    Body mass index is 15.28 kg/m.  Other information:   DVT prophylaxis: Lovenox   Code Status: Full code. Family Communication: None  Disposition Plan: Home Consults called: None Admission status: Inpatient     Ryelee Albee [facility] Pager: Please check [email or web address]   How to contact the [facility] Attending or Consulting provider 7A - 7P or covering provider during after hours 7P -7A, for this patient?   Check the care team in [facility] and look for a) attending/consulting [facility] provider listed and b) the [facility] team listed Log into [email or web address] and use [facility]'s universal password to access. If you do not have the password, please contact the hospital operator. Locate the [facility] provider you are looking for under [facility] and page to a number that you can be directly reached. If you still have difficulty reaching the provider,  please page the [facility] (Director on Call) for the Hospitalists listed on amion for assistance.  01/05/2024, 5:24 PM

## 2024-01-05 NOTE — ED Notes (Signed)
 Pt stating she woke up with breathing difficulty about 1.5 hrs ago.

## 2024-01-05 NOTE — Progress Notes (Signed)
 PHARMACIST - PHYSICIAN COMMUNICATION  CONCERNING:  Enoxaparin  (Lovenox ) for DVT Prophylaxis   RECOMMENDATION: Patient was prescribed enoxaprin 40mg  q24 hours for VTE prophylaxis.   Filed Weights   01/05/24 1249  Weight: 35.5 kg (78 lb 4.2 oz)    Body mass index is 15.28 kg/m.  Estimated Creatinine Clearance: 35.9 mL/min (by C-G formula based on SCr of 0.91 mg/dL).  Patient is candidate for enoxaparin  30mg  every 24 hours based on CrCl <59ml/min or Weight <45kg  DESCRIPTION: Pharmacy has adjusted enoxaparin  dose per [facility] policy.  Patient is now receiving enoxaparin  30 mg every 24 hours   Craven Do, PharmD Pharmacy Resident  01/05/2024 5:37 PM

## 2024-01-05 NOTE — ED Notes (Signed)
 Pt up to use restroom, steady on feet, 1 person assist for safety.

## 2024-01-05 NOTE — ED Provider Notes (Signed)
 Care of this patient assumed from prior physician at 1500 pending completion of labwork and anticipated admission. Please see prior physician note for further details.  Briefly this is a 63 year old female with history of nonischemic cardiomyopathy, COPD, HTN presenting to the emergency department for evaluation of shortness of breath.  Presented with tachycardia, mild hypoxia.  X-Camauri Fleece demonstrated increasing interstitial opacities, per radiology consideration for acute process.  Labs pending at time of signout.  Plan for diuresis if patient had elevated BNP.  If BNP was normal, plan to treat for possible infectious process.  Labs resulted with leukocytosis WC of 14.9.  Significantly elevated BNP at 1800.  Negative procalcitonin.  Troponin elevated at 235.  Clinical presentation seems most consistent with CHF exacerbation with associated cardiac strain.  Ordered for aspirin .  Patient updated on results of workup.  Ordered for IV Lasix .  Discussed admission and patient is agreeable.  Will reach out to hospitalist team.  Case discussed with hospitalist team.  They will evaluate for anticipated admission.   Claria Crofts, MD 01/05/24 [phone]

## 2024-01-06 DIAGNOSIS — I5023 Acute on chronic systolic (congestive) heart failure: Secondary | ICD-10-CM | POA: Diagnosis not present

## 2024-01-06 DIAGNOSIS — J189 Pneumonia, unspecified organism: Secondary | ICD-10-CM

## 2024-01-06 LAB — BASIC METABOLIC PANEL WITH GFR
Anion gap: 9 (ref 5–15)
BUN: 24 mg/dL — ABNORMAL HIGH (ref 8–23)
CO2: 25 mmol/L (ref 22–32)
Calcium: 8.7 mg/dL — ABNORMAL LOW (ref 8.9–10.3)
Chloride: 108 mmol/L (ref 98–111)
Creatinine, Ser: 1.14 mg/dL — ABNORMAL HIGH (ref 0.44–1.00)
GFR, Estimated: 54 mL/min — ABNORMAL LOW (ref >60)
Glucose, Bld: 113 mg/dL — ABNORMAL HIGH (ref 70–99)
Potassium: 3.2 mmol/L — ABNORMAL LOW (ref 3.5–5.1)
Sodium: 142 mmol/L (ref 135–145)

## 2024-01-06 LAB — URINALYSIS, COMPLETE (UACMP) WITH MICROSCOPIC
Bilirubin Urine: NEGATIVE
Glucose, UA: >=500 mg/dL — AB
Ketones, ur: NEGATIVE mg/dL
Nitrite: NEGATIVE
Protein, ur: 100 mg/dL — AB
RBC / HPF: >50 RBC/hpf (ref 0–5)
Specific Gravity, Urine: 1.011 (ref 1.005–1.030)
WBC, UA: >50 WBC/hpf (ref 0–5)
pH: 6 (ref 5.0–8.0)

## 2024-01-06 LAB — URINE DRUG SCREEN, QUALITATIVE (ARMC ONLY)
Amphetamines, Ur Screen: NOT DETECTED
Barbiturates, Ur Screen: NOT DETECTED
Benzodiazepine, Ur Scrn: NOT DETECTED
Cannabinoid 50 Ng, Ur ~~LOC~~: NOT DETECTED
Cocaine Metabolite,Ur ~~LOC~~: POSITIVE — AB
MDMA (Ecstasy)Ur Screen: NOT DETECTED
Methadone Scn, Ur: NOT DETECTED
Opiate, Ur Screen: NOT DETECTED
Phencyclidine (PCP) Ur S: NOT DETECTED
Tricyclic, Ur Screen: NOT DETECTED

## 2024-01-06 LAB — MAGNESIUM: Magnesium: 1.7 mg/dL (ref 1.7–2.4)

## 2024-01-06 MED ORDER — IPRATROPIUM-ALBUTEROL 0.5-2.5 (3) MG/3ML IN SOLN: 3.0000 mL | Freq: Four times a day (QID) | RESPIRATORY_TRACT | Status: DC | PRN

## 2024-01-06 MED ORDER — SODIUM CHLORIDE 0.9 % IV SOLN
500.0000 mg | INTRAVENOUS | Status: DC
  Administered 2024-01-06 – 2024-01-07 (×2): 500 mg via INTRAVENOUS
  Filled 2024-01-06 (×2): qty 5

## 2024-01-06 NOTE — ED Notes (Signed)
 MD ordered to HOLD BP meds for evening

## 2024-01-06 NOTE — ED Notes (Signed)
Informed RN bed assigned 

## 2024-01-06 NOTE — Progress Notes (Signed)
 Heart Failure Navigator Progress Note  Patient readmitted the ED for Acute on Chronic HFrEF in ED 33.  HF Navigator visited with patient to provide HF education but she was sleeping.  Hospital Follow-Up scheduled for 01/13/2024  @ 12:00.  Appointment card left at the bedside and also added to her AVS.    Navigator will return for Heart Failure education when she is awake.   Celedonio Coil, RN, BSN [facility] Heart Failure Navigator Secure Chat Only

## 2024-01-06 NOTE — ED Notes (Signed)
 Pt up to bedside commode

## 2024-01-06 NOTE — TOC CM/SW Note (Signed)
 CHF patient without PT/OT consult.  TOC acknowledges consult, however, no TOC needs at this time.

## 2024-01-06 NOTE — Progress Notes (Signed)
 PROGRESS NOTE    Penny Gill  UJW:[id number] DOB: 1961-07-23 DOA: 01/05/2024 PCP: Pcp, No   Assessment & Plan:   Principal Problem:   Acute on chronic HFrEF (heart failure with reduced ejection fraction) (HCC)  Assessment and Plan:  Acute on chronic combined CHF: continue on IV lasix . Monitor I/Os. Continue on coreg , farxiga ,losartan    Acute hypoxic respiratory failure: initial oxygen saturation was 88% on 2 L of oxygen. Weaned off of supplemental oxygen  Possible CAP: continue on IV rocephin  & start azithromycin w/ leukocytosis & fever. Bronchodilators prn & encourage incentive spirometry.    Elevated troponins: likely secondary to demand ischemia & cocaine use. No CP. Right and left heart cath in March 2025 showed normal coronaries.  Cocaine abuse: urine drug screen was positive for cocaine. Received cessation counseling already    Fever: etiology unclear. Blood cxs NGTD. UA showed large leukocytes but rare bacteria.    COPD: w/o exacerbation. Bronchodilators prn        DVT prophylaxis: lovenox  Code Status: full  Family Communication:  Disposition Plan: likely d/c back to nursing home   Status is: Inpatient Remains inpatient appropriate because: severity of illness    Level of care: Progressive Consultants:    Procedures:    Antimicrobials:   Subjective: Pt is lethargic   Objective: Vitals:   01/06/24 0315 01/06/24 0400 01/06/24 0732 01/06/24 0814  BP:  118/75 121/84   Pulse: (!) 122 (!) 102 98   Resp: (!) 21 18 17    Temp: (!) 102.7 F (39.3 C) (!) 101.7 F (38.7 C)  99.4 F (37.4 C)  TempSrc: Axillary Axillary  Oral  SpO2: 96% 96% 94%   Weight:      Height:       No intake or output data in the 24 hours ending 01/06/24 0837 Filed Weights   01/05/24 1249  Weight: 35.5 kg    Examination:  General exam: Appears calm and comfortable  Respiratory system: Clear to auscultation. Respiratory effort normal. Cardiovascular system: S1 & S2  heard, RRR. No JVD, murmurs, rubs, gallops or clicks. No pedal edema. Gastrointestinal system: Abdomen is nondistended, soft and nontender. No organomegaly or masses felt. Normal bowel sounds heard. Central nervous system: Alert and oriented. No focal neurological deficits. Extremities: Symmetric 5 x 5 power. Skin: No rashes, lesions or ulcers Psychiatry: Judgement and insight appear normal. Mood & affect appropriate.     Data Reviewed: I have personally reviewed following labs and imaging studies  CBC: Recent Labs  Lab 01/05/24 1438  WBC 14.9*  HGB 12.5  HCT 37.8  MCV 89.2  PLT 307   Basic Metabolic Panel: Recent Labs  Lab 01/05/24 1438 01/06/24 0338  NA 142 142  K 3.5 3.2*  CL 106 108  CO2 22 25  GLUCOSE 105* 113*  BUN 22 24*  CREATININE 0.91 1.14*  CALCIUM  9.2 8.7*  MG  --  1.7   GFR: Estimated Creatinine Clearance: 28.7 mL/min (A) (by C-G formula based on SCr of 1.14 mg/dL (H)). Liver Function Tests: No results for input(s): "AST", "ALT", "ALKPHOS", "BILITOT", "PROT", "ALBUMIN" in the last 168 hours. No results for input(s): "LIPASE", "AMYLASE" in the last 168 hours. No results for input(s): "AMMONIA" in the last 168 hours. Coagulation Profile: No results for input(s): "INR", "PROTIME" in the last 168 hours. Cardiac Enzymes: No results for input(s): "CKTOTAL", "CKMB", "CKMBINDEX", "TROPONINI" in the last 168 hours. BNP (last 3 results) No results for input(s): "PROBNP" in the last 8760 hours. HbA1C:  No results for input(s): "HGBA1C" in the last 72 hours. CBG: No results for input(s): "GLUCAP" in the last 168 hours. Lipid Profile: No results for input(s): "CHOL", "HDL", "LDLCALC", "TRIG", "CHOLHDL", "LDLDIRECT" in the last 72 hours. Thyroid  Function Tests: No results for input(s): "TSH", "T4TOTAL", "FREET4", "T3FREE", "THYROIDAB" in the last 72 hours. Anemia Panel: No results for input(s): "VITAMINB12", "FOLATE", "FERRITIN", "TIBC", "IRON", "RETICCTPCT" in  the last 72 hours. Sepsis Labs: Recent Labs  Lab 01/05/24 1438  PROCALCITON 0.10    Recent Results (from the past 240 hours)  Culture, blood (Routine X 2) w Reflex to ID Panel     Status: None (Preliminary result)   Collection Time: 01/05/24 11:46 PM   Specimen: BLOOD  Result Value Ref Range Status   Specimen Description BLOOD RIGHT FA  Final   Special Requests   Final    BOTTLES DRAWN AEROBIC AND ANAEROBIC Blood Culture results may not be optimal due to an inadequate volume of blood received in culture bottles   Culture   Final    NO GROWTH < 12 HOURS Performed at [facility], [address], [place], [place] [address]    Report Status PENDING  Incomplete  Culture, blood (Routine X 2) w Reflex to ID Panel     Status: None (Preliminary result)   Collection Time: 01/05/24 11:46 PM   Specimen: BLOOD  Result Value Ref Range Status   Specimen Description BLOOD LEFT FA  Final   Special Requests   Final    BOTTLES DRAWN AEROBIC AND ANAEROBIC Blood Culture results may not be optimal due to an inadequate volume of blood received in culture bottles   Culture   Final    NO GROWTH < 12 HOURS Performed at [facility], [address] [place], [place] [address]    Report Status PENDING  Incomplete         Radiology Studies: DG Chest 2 View Result Date: 01/05/2024 CLINICAL DATA:  Shortness of breath EXAM: CHEST - 2 VIEW COMPARISON:  X-ray 12/20/2023 FINDINGS: Stable cardiopericardial silhouette. No pneumothorax or effusion. Prominent central vasculature. Increasing lung base parenchymal interstitial opacities. Acute infiltrate is possible. Overlapping cardiac leads. Degenerative changes of the spine. IMPRESSION: Increasing lung base parenchymal interstitial opacities. Acute process is possible. Recommend follow-up. Electronically Signed   By: Adrianna Horde M.D.   On: 01/05/2024 14:25        Scheduled Meds:  atorvastatin   40 mg Oral QHS   carvedilol   3.125 mg  Oral BID WC   dapagliflozin  propanediol  10 mg Oral Daily   DULoxetine  30 mg Oral Daily   enoxaparin  (LOVENOX ) injection  30 mg Subcutaneous Q24H   furosemide   40 mg Intravenous BID   losartan   12.5 mg Oral Daily   sodium chloride  flush  3 mL Intravenous Q12H   spironolactone   12.5 mg Oral Daily   Continuous Infusions:  sodium chloride      cefTRIAXone  (ROCEPHIN )  IV Stopped (01/06/24 0032)     LOS: 1 day       Alphonsus Jeans, MD [facility] Pager [phone]  If 7PM-7AM, please contact night-coverag 01/06/2024, 8:37 AM

## 2024-01-06 NOTE — ED Notes (Addendum)
 Pt had 14-beat run of VT while asleep. MD notified.

## 2024-01-06 NOTE — ED Notes (Signed)
 Pt eating lunch tray

## 2024-01-06 NOTE — ED Notes (Signed)
 BP low. Pt reports feeling tired. MD notified. BP meds held

## 2024-01-06 NOTE — ED Notes (Signed)
 Pt sleeping at this time. No distress. Respirations even and unlabored.

## 2024-01-06 NOTE — Progress Notes (Addendum)
 Heart Failure Stewardship Pharmacy Note  PCP: Pcp, No PCP-Cardiologist: Antionette Kirks, MD  HPI: Penny Gill is a 63 y.o. female with cocaine and tobacco abuse, HTN, HLD, DM, COPD not on O2, depression, chronic pain, sickle cell trait who presented with shortness of breath worsening over several days. On admission, BNP was 1852.3, HS-troponin was 235 > 380, and USD was positive for cocaine. Patient has been febrile up to 103.80F with WBC of 14.9. Urinalysis was not a clean catch given squamous epithelial cells present. Blood cultures pending. Chest x-ray noted increasing lung base parenchymal interstitial opacities.   Pertinent cardiac history: Echocardiogram 10/2023 noted LVEF reduced to 30-35% with mild LVH, global hypokinesis, grade I diastolic dysfunction, mild MR. RHC on 12/22/23 showed RA 3, RV 26/3, PCWP 8, LVEDP 10 mmHg, CI 2.1 L/min/m2. LHC 12/22/23 was negative for coronary disease.     Pertinent Lab Values: Creatinine  Date Value Ref Range Status  07/05/2014 1.11 0.60 - 1.30 mg/dL Final   Creatinine, Ser  Date Value Ref Range Status  01/06/2024 1.14 (H) 0.44 - 1.00 mg/dL Final   BUN  Date Value Ref Range Status  01/06/2024 24 (H) 8 - 23 mg/dL Final  95/62/1308 18 7 - 18 mg/dL Final   Potassium  Date Value Ref Range Status  01/06/2024 3.2 (L) 3.5 - 5.1 mmol/L Final  07/05/2014 3.8 3.5 - 5.1 mmol/L Final   Sodium  Date Value Ref Range Status  01/06/2024 142 135 - 145 mmol/L Final  07/05/2014 141 136 - 145 mmol/L Final   B Natriuretic Peptide  Date Value Ref Range Status  01/05/2024 1,852.3 (H) 0.0 - 100.0 pg/mL Final    Comment:    Performed at [facility], [address] [place], [place] [address]   Magnesium   Date Value Ref Range Status  01/06/2024 1.7 1.7 - 2.4 mg/dL Final    Comment:    Performed at [facility], [address] [place], [place] [address]   Hgb A1c MFr Bld  Date Value Ref Range Status  11/15/2023 5.8 (H) 4.8 - 5.6 %  Final    Comment:    (NOTE)         Prediabetes: 5.7 - 6.4         Diabetes: >6.4         Glycemic control for adults with diabetes: <7.0    TSH  Date Value Ref Range Status  10/13/2023 0.665 0.350 - 4.500 uIU/mL Final    Comment:    Performed by a 3rd Generation assay with a functional sensitivity of <=0.01 uIU/mL. Performed at [facility], [address] [place], [place] [address]     Vital Signs:  Temp:  [98.1 F (36.7 C)-103.5 F (39.7 C)] 101.7 F (38.7 C) (05/14 0400) Pulse Rate:  [56-152] 102 (05/14 0400) Cardiac Rhythm: Ventricular tachycardia (05/13 1424) Resp:  [18-37] 18 (05/14 0400) BP: (112-170)/(73-112) 118/75 (05/14 0400) SpO2:  [88 %-100 %] 96 % (05/14 0400) Weight:  [35.5 kg (78 lb 4.2 oz)] 35.5 kg (78 lb 4.2 oz) (05/13 1249) No intake or output data in the 24 hours ending 01/06/24 0720  Current Heart Failure Medications:  Loop diuretic: furosemide  40 mg IV BID Beta-Blocker: carvedilol  3.125 mg BID ACEI/ARB/ARNI: losartan  12.5 mg daily MRA: spironolactone  12.5 mg daily SGLT2i: Farxiga  10 mg daily  Prior to admission Heart Failure Medications:  Loop diuretic: furosemide  20 mg prn Beta-Blocker: carvedilol  3.125 mg BID ACEI/ARB/ARNI: losartan  12,5 mg daily3 MRA: spironolactone  12.5  mg daily SGLT2i: Farxiga  10 mg daily  Assessment: 1. 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 NYHA class IV symptoms.  -Symptoms: Patient reports shortness of breath at rest and fatigue. Reports LEE is improving. -Volume: Appears hypervolemic. JVP up. Creatinine is mildly elevated after diuresis. Patient reported doubling furosemide  prior to admission. No I/Os charted this far. Currently on furosemide  40 mg IV BID. Patient reports decent urine output.  -Hemodynamics: BP high-normal on admission, now most recently down to 96/71. Rhythm is sinus tachycardia.  -BB: Currently on carvedilol  3.125 mg BID. Cocaine use  and COPD noted. Would monitor for now. -ACEI/ARB/ARNI: Continue losartan  12.5 mg daily. Can consider titration as BP allows. -MRA: Continue spironolactone  12.5 mg daily. Would consider increasing this to 25 mg daily if BP is back up this afternoon given hypokalemia. -SGLT2i: Currently on Farxiga  10 mg daily. UA showed 6-10 squamous epithelial cells, suggesting urine sample was not a clean catch. Could consider recheck if concerned for UTI.  Plan: 1) Medication changes recommended at this time: -None at this time. May consider holding Farxiga  at this time if UTI is suspected.  2) Patient assistance: -Entresto and Farxiga  are $4  3) Education: - Patient has been educated on current HF medications and potential additions to HF medication regimen - Patient verbalizes understanding that over the next few months, these medication doses may change and more medications may be added to optimize HF regimen - Patient has been educated on basic disease state pathophysiology and goals of therapy  Medication Assistance / Insurance Benefits Check: Does the patient have prescription insurance?    Type of insurance plan:  Does the patient qualify for medication assistance through manufacturers or grants? Pending   Outpatient Pharmacy: Prior to admission outpatient pharmacy: [facility]      Please do not hesitate to reach out with questions or concerns,  Bevely Brush, PharmD, CPP, BCPS Heart Failure Pharmacist  Phone - [phone] 01/06/2024 11:31 AM

## 2024-01-07 ENCOUNTER — Inpatient Hospital Stay: Payer: MEDICAID

## 2024-01-07 ENCOUNTER — Other Ambulatory Visit: Payer: Self-pay

## 2024-01-07 DIAGNOSIS — R197 Diarrhea, unspecified: Secondary | ICD-10-CM

## 2024-01-07 DIAGNOSIS — I4581 Long QT syndrome: Secondary | ICD-10-CM

## 2024-01-07 DIAGNOSIS — I959 Hypotension, unspecified: Secondary | ICD-10-CM

## 2024-01-07 DIAGNOSIS — R571 Hypovolemic shock: Secondary | ICD-10-CM

## 2024-01-07 DIAGNOSIS — I5023 Acute on chronic systolic (congestive) heart failure: Secondary | ICD-10-CM | POA: Diagnosis not present

## 2024-01-07 DIAGNOSIS — N179 Acute kidney failure, unspecified: Secondary | ICD-10-CM

## 2024-01-07 DIAGNOSIS — E119 Type 2 diabetes mellitus without complications: Secondary | ICD-10-CM

## 2024-01-07 DIAGNOSIS — J189 Pneumonia, unspecified organism: Secondary | ICD-10-CM | POA: Diagnosis not present

## 2024-01-07 DIAGNOSIS — J9601 Acute respiratory failure with hypoxia: Secondary | ICD-10-CM

## 2024-01-07 LAB — CBC WITH DIFFERENTIAL/PLATELET
Abs Immature Granulocytes: 0.03 10*3/uL (ref 0.00–0.07)
Basophils Absolute: 0 10*3/uL (ref 0.0–0.1)
Basophils Relative: 0 %
Eosinophils Absolute: 0.1 10*3/uL (ref 0.0–0.5)
Eosinophils Relative: 1 %
HCT: 34.3 % — ABNORMAL LOW (ref 36.0–46.0)
Hemoglobin: 11 g/dL — ABNORMAL LOW (ref 12.0–15.0)
Immature Granulocytes: 0 %
Lymphocytes Relative: 19 %
Lymphs Abs: 1.3 10*3/uL (ref 0.7–4.0)
MCH: 29 pg (ref 26.0–34.0)
MCHC: 32.1 g/dL (ref 30.0–36.0)
MCV: 90.5 fL (ref 80.0–100.0)
Monocytes Absolute: 0.5 10*3/uL (ref 0.1–1.0)
Monocytes Relative: 7 %
Neutro Abs: 4.9 10*3/uL (ref 1.7–7.7)
Neutrophils Relative %: 73 %
Platelets: 237 10*3/uL (ref 150–400)
RBC: 3.79 MIL/uL — ABNORMAL LOW (ref 3.87–5.11)
RDW: 12.6 % (ref 11.5–15.5)
WBC: 6.9 10*3/uL (ref 4.0–10.5)
nRBC: 0 % (ref 0.0–0.2)

## 2024-01-07 LAB — COMPREHENSIVE METABOLIC PANEL WITH GFR
ALT: 321 U/L — ABNORMAL HIGH (ref 0–44)
AST: 677 U/L — ABNORMAL HIGH (ref 15–41)
Albumin: 2.4 g/dL — ABNORMAL LOW (ref 3.5–5.0)
Alkaline Phosphatase: 269 U/L — ABNORMAL HIGH (ref 38–126)
Anion gap: 13 (ref 5–15)
BUN: 32 mg/dL — ABNORMAL HIGH (ref 8–23)
CO2: 18 mmol/L — ABNORMAL LOW (ref 22–32)
Calcium: 7.9 mg/dL — ABNORMAL LOW (ref 8.9–10.3)
Chloride: 104 mmol/L (ref 98–111)
Creatinine, Ser: 1.41 mg/dL — ABNORMAL HIGH (ref 0.44–1.00)
GFR, Estimated: 42 mL/min — ABNORMAL LOW (ref >60)
Glucose, Bld: 292 mg/dL — ABNORMAL HIGH (ref 70–99)
Potassium: 4.5 mmol/L (ref 3.5–5.1)
Sodium: 135 mmol/L (ref 135–145)
Total Bilirubin: 1 mg/dL (ref 0.0–1.2)
Total Protein: 5.4 g/dL — ABNORMAL LOW (ref 6.5–8.1)

## 2024-01-07 LAB — CBC
HCT: 35.8 % — ABNORMAL LOW (ref 36.0–46.0)
Hemoglobin: 12.1 g/dL (ref 12.0–15.0)
MCH: 29.6 pg (ref 26.0–34.0)
MCHC: 33.8 g/dL (ref 30.0–36.0)
MCV: 87.5 fL (ref 80.0–100.0)
Platelets: 265 10*3/uL (ref 150–400)
RBC: 4.09 MIL/uL (ref 3.87–5.11)
RDW: 12.5 % (ref 11.5–15.5)
WBC: 16.8 10*3/uL — ABNORMAL HIGH (ref 4.0–10.5)
nRBC: 0 % (ref 0.0–0.2)

## 2024-01-07 LAB — COOXEMETRY PANEL
Carboxyhemoglobin: 1.3 % (ref 0.5–1.5)
Methemoglobin: 0.8 % (ref 0.0–1.5)
O2 Saturation: 92.5 %
Total hemoglobin: 9 g/dL — ABNORMAL LOW (ref 12.0–16.0)
Total oxygen content: 90.6 %

## 2024-01-07 LAB — BLOOD GAS, ARTERIAL
Acid-base deficit: 7.6 mmol/L — ABNORMAL HIGH (ref 0.0–2.0)
Bicarbonate: 16.2 mmol/L — ABNORMAL LOW (ref 20.0–28.0)
O2 Content: 15 L/min
O2 Saturation: 100 %
Patient temperature: 37
pCO2 arterial: 28 mmHg — ABNORMAL LOW (ref 32–48)
pH, Arterial: 7.37 (ref 7.35–7.45)
pO2, Arterial: 413 mmHg — ABNORMAL HIGH (ref 83–108)

## 2024-01-07 LAB — BASIC METABOLIC PANEL WITH GFR
Anion gap: 8 (ref 5–15)
BUN: 29 mg/dL — ABNORMAL HIGH (ref 8–23)
CO2: 26 mmol/L (ref 22–32)
Calcium: 8 mg/dL — ABNORMAL LOW (ref 8.9–10.3)
Chloride: 104 mmol/L (ref 98–111)
Creatinine, Ser: 1.04 mg/dL — ABNORMAL HIGH (ref 0.44–1.00)
GFR, Estimated: >60 mL/min (ref >60)
Glucose, Bld: 98 mg/dL (ref 70–99)
Potassium: 3 mmol/L — ABNORMAL LOW (ref 3.5–5.1)
Sodium: 138 mmol/L (ref 135–145)

## 2024-01-07 LAB — LACTIC ACID, PLASMA: Lactic Acid, Venous: 1.1 mmol/L (ref 0.5–1.9)

## 2024-01-07 LAB — MRSA NEXT GEN BY PCR, NASAL: MRSA by PCR Next Gen: NOT DETECTED

## 2024-01-07 LAB — GLUCOSE, CAPILLARY
Glucose-Capillary: 177 mg/dL — ABNORMAL HIGH (ref 70–99)
Glucose-Capillary: 205 mg/dL — ABNORMAL HIGH (ref 70–99)
Glucose-Capillary: 290 mg/dL — ABNORMAL HIGH (ref 70–99)

## 2024-01-07 LAB — PHOSPHORUS: Phosphorus: 4.3 mg/dL (ref 2.5–4.6)

## 2024-01-07 LAB — TROPONIN I (HIGH SENSITIVITY)
Troponin I (High Sensitivity): 135 ng/L (ref <18)
Troponin I (High Sensitivity): 1015 ng/L (ref <18)

## 2024-01-07 LAB — MAGNESIUM: Magnesium: 2.1 mg/dL (ref 1.7–2.4)

## 2024-01-07 LAB — D-DIMER, QUANTITATIVE: D-Dimer, Quant: 1.79 ug{FEU}/mL — ABNORMAL HIGH (ref 0.00–0.50)

## 2024-01-07 LAB — LIPASE, BLOOD: Lipase: 31 U/L (ref 11–51)

## 2024-01-07 MED ORDER — NOREPINEPHRINE 4 MG/250ML-% IV SOLN
INTRAVENOUS | Status: AC
  Filled 2024-01-07: qty 250

## 2024-01-07 MED ORDER — SODIUM BICARBONATE 8.4 % IV SOLN
INTRAVENOUS | Status: AC
Start: 2024-01-07 — End: 2024-01-08
  Filled 2024-01-07: qty 50

## 2024-01-07 MED ORDER — MIDODRINE HCL 5 MG PO TABS
10.0000 mg | ORAL_TABLET | Freq: Three times a day (TID) | ORAL | Status: DC
  Administered 2024-01-07 – 2024-01-09 (×10): 10 mg via ORAL
  Filled 2024-01-07 (×10): qty 2

## 2024-01-07 MED ORDER — NOREPINEPHRINE 4 MG/250ML-% IV SOLN
2.0000 ug/min | INTRAVENOUS | Status: DC
  Administered 2024-01-07: 10 ug/min via INTRAVENOUS
  Administered 2024-01-08: 8 ug/min via INTRAVENOUS
  Filled 2024-01-07: qty 250

## 2024-01-07 MED ORDER — SODIUM CHLORIDE 0.9 % IV SOLN
250.0000 mL | INTRAVENOUS | Status: AC
  Administered 2024-01-07: 250 mL via INTRAVENOUS

## 2024-01-07 MED ORDER — POTASSIUM CHLORIDE CRYS ER 20 MEQ PO TBCR
40.0000 meq | EXTENDED_RELEASE_TABLET | Freq: Two times a day (BID) | ORAL | Status: AC
Start: 2024-01-07 — End: 2024-01-07
  Administered 2024-01-07 (×2): 40 meq via ORAL
  Filled 2024-01-07 (×2): qty 2

## 2024-01-07 MED ORDER — SODIUM CHLORIDE 0.9 % IV BOLUS
500.0000 mL | Freq: Once | INTRAVENOUS | Status: AC
  Administered 2024-01-07: 500 mL via INTRAVENOUS

## 2024-01-07 MED ORDER — SODIUM BICARBONATE 8.4 % IV SOLN: 50.0000 meq | Freq: Once | INTRAVENOUS | Status: DC

## 2024-01-07 MED ORDER — INSULIN ASPART 100 UNIT/ML IJ SOLN
0.0000 [IU] | INTRAMUSCULAR | Status: DC
  Administered 2024-01-08: 2 [IU] via SUBCUTANEOUS
  Administered 2024-01-08 – 2024-01-09 (×4): 3 [IU] via SUBCUTANEOUS
  Administered 2024-01-10: 2 [IU] via SUBCUTANEOUS
  Administered 2024-01-10: 3 [IU] via SUBCUTANEOUS
  Administered 2024-01-10: 5 [IU] via SUBCUTANEOUS
  Administered 2024-01-11 (×2): 2 [IU] via SUBCUTANEOUS
  Filled 2024-01-07 (×11): qty 1

## 2024-01-07 MED ORDER — SODIUM CHLORIDE 0.9 % IV BOLUS
250.0000 mL | Freq: Once | INTRAVENOUS | Status: AC
  Administered 2024-01-07: 250 mL via INTRAVENOUS

## 2024-01-07 MED ORDER — SODIUM CHLORIDE 0.9 % IV BOLUS
1000.0000 mL | Freq: Once | INTRAVENOUS | Status: AC
  Administered 2024-01-07: 1000 mL via INTRAVENOUS

## 2024-01-07 MED ORDER — SODIUM BICARBONATE 8.4 % IV SOLN
50.0000 meq | Freq: Once | INTRAVENOUS | Status: AC
  Administered 2024-01-07: 50 meq via INTRAVENOUS
  Filled 2024-01-07: qty 50

## 2024-01-07 MED ORDER — IOHEXOL 350 MG/ML SOLN
75.0000 mL | Freq: Once | INTRAVENOUS | Status: AC | PRN
  Administered 2024-01-07: 75 mL via INTRAVENOUS

## 2024-01-07 MED ORDER — CHLORHEXIDINE GLUCONATE CLOTH 2 % EX PADS
6.0000 | MEDICATED_PAD | Freq: Every day | CUTANEOUS | Status: DC
  Administered 2024-01-07 – 2024-01-09 (×3): 6 via TOPICAL

## 2024-01-07 MED ORDER — SODIUM CHLORIDE 0.9 % IV SOLN
100.0000 mg | Freq: Two times a day (BID) | INTRAVENOUS | Status: DC
  Administered 2024-01-08 – 2024-01-10 (×7): 100 mg via INTRAVENOUS
  Filled 2024-01-07 (×9): qty 100

## 2024-01-07 MED ORDER — FENTANYL CITRATE PF 50 MCG/ML IJ SOSY
PREFILLED_SYRINGE | INTRAMUSCULAR | Status: AC
  Filled 2024-01-07: qty 1

## 2024-01-07 MED ORDER — FENTANYL CITRATE PF 50 MCG/ML IJ SOSY
25.0000 ug | PREFILLED_SYRINGE | Freq: Once | INTRAMUSCULAR | Status: AC
  Administered 2024-01-07: 25 ug via INTRAVENOUS

## 2024-01-07 NOTE — Plan of Care (Signed)

## 2024-01-07 NOTE — Procedures (Signed)
 Central Venous Catheter Insertion Procedure Note  Penny Gill  [id number]  09-12-1960  Date:01/07/24  Time:11:05 PM   Provider Performing:Glenette Bookwalter L Rust-Chester   Procedure: Insertion of Non-tunneled Central Venous Catheter(36556) {Procedure:340960248}  Indication(s) {CVC Indications:304960229}  Consent {Consent:304960230}  Anesthesia {Anesthesia:304960231::"Topical only with 1% lidocaine  "}  Timeout Verified patient identification, verified procedure, site/side was marked, verified correct patient position, special equipment/implants available, medications/allergies/relevant history reviewed, required imaging and test results available.  Sterile Technique {Sterile Technique:304960232}  Procedure Description Area of catheter insertion was cleaned with chlorhexidine and draped in sterile fashion.  {With/Without:304960234} real-time ultrasound guidance a {Catheter WUJW:119147829} was placed into the {LEFT/RIGHT:30496088} {anatomy; vein central:304960887}. Nonpulsatile blood flow and easy flushing noted in all ports.  The catheter was sutured in place and sterile dressing applied.  Complications/Tolerance {Complications:22373::"None; patient tolerated the procedure well."} Chest X-ray is ordered to verify placement for internal jugular or subclavian cannulation.   Chest x-ray is not ordered for femoral cannulation.  EBL {EBL:304960236::"Minimal"}  Specimen(s) None

## 2024-01-07 NOTE — Progress Notes (Addendum)
 Heart Failure Stewardship Pharmacy Note  PCP: Pcp, No PCP-Cardiologist: Antionette Kirks, MD  HPI: Penny Gill is a 63 y.o. female with cocaine and tobacco abuse, HTN, HLD, DM, COPD not on O2, depression, chronic pain, sickle cell trait who presented with shortness of breath worsening over several days. On admission, BNP was 1852.3, HS-troponin was 235 > 380, and USD was positive for cocaine. Patient has been febrile up to 103.22F with WBC of 14.9. Urinalysis was not a clean catch given squamous epithelial cells present. Blood cultures pending. Chest x-ray noted increasing lung base parenchymal interstitial opacities.   Pertinent cardiac history: Echocardiogram 10/2023 noted LVEF reduced to 30-35% with mild LVH, global hypokinesis, grade I diastolic dysfunction, mild MR. RHC on 12/22/23 showed RA 3, RV 26/3, PCWP 8, LVEDP 10 mmHg, CI 2.1 L/min/m2. LHC 12/22/23 was negative for coronary disease.     Pertinent Lab Values: Creatinine  Date Value Ref Range Status  07/05/2014 1.11 0.60 - 1.30 mg/dL Final   Creatinine, Ser  Date Value Ref Range Status  01/07/2024 1.04 (H) 0.44 - 1.00 mg/dL Final   BUN  Date Value Ref Range Status  01/07/2024 29 (H) 8 - 23 mg/dL Final  16/05/9603 18 7 - 18 mg/dL Final   Potassium  Date Value Ref Range Status  01/07/2024 3.0 (L) 3.5 - 5.1 mmol/L Final  07/05/2014 3.8 3.5 - 5.1 mmol/L Final   Sodium  Date Value Ref Range Status  01/07/2024 138 135 - 145 mmol/L Final  07/05/2014 141 136 - 145 mmol/L Final   B Natriuretic Peptide  Date Value Ref Range Status  01/05/2024 1,852.3 (H) 0.0 - 100.0 pg/mL Final    Comment:    Performed at [facility], [address] [place], [place] [address]   Magnesium   Date Value Ref Range Status  01/06/2024 1.7 1.7 - 2.4 mg/dL Final    Comment:    Performed at [facility], [address] [place], [place] [address]   Hgb A1c MFr Bld  Date Value Ref Range Status  11/15/2023 5.8 (H) 4.8 - 5.6 %  Final    Comment:    (NOTE)         Prediabetes: 5.7 - 6.4         Diabetes: >6.4         Glycemic control for adults with diabetes: <7.0    TSH  Date Value Ref Range Status  10/13/2023 0.665 0.350 - 4.500 uIU/mL Final    Comment:    Performed by a 3rd Generation assay with a functional sensitivity of <=0.01 uIU/mL. Performed at [facility], [address] [place], [place] [address]    Vital Signs:  Temp:  [98.1 F (36.7 C)-99.5 F (37.5 C)] 98.2 F (36.8 C) (05/15 1108) Pulse Rate:  [84-112] 91 (05/15 1108) Cardiac Rhythm: Normal sinus rhythm (05/15 0809) Resp:  [16-20] 20 (05/15 0353) BP: (80-94)/(60-78) 84/70 (05/15 1108) SpO2:  [95 %-100 %] 100 % (05/15 1108) Weight:  [33 kg (72 lb 12 oz)] 33 kg (72 lb 12 oz) (05/15 0500) No intake or output data in the 24 hours ending 01/07/24 1310  Current Heart Failure Medications:  Loop diuretic: furosemide  40 mg IV BID Beta-Blocker: carvedilol  3.125 mg BID ACEI/ARB/ARNI: losartan  12.5 mg daily MRA: spironolactone  12.5 mg daily SGLT2i: Farxiga  10 mg daily  Prior to admission Heart Failure Medications:  Loop diuretic: furosemide  20 mg prn Beta-Blocker: carvedilol  3.125 mg BID ACEI/ARB/ARNI: losartan  12.5 mg daily MRA: spironolactone  12.5  mg daily SGLT2i: Farxiga  10 mg daily  Assessment: 1. 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 NYHA class IV symptoms.  -Symptoms: Patient reports resolved shortness of breath at rest, but persistent fatigue/lethargy. Reports LEE is resolved. -Volume: Appears hypovolemic. REDS today was very low at 24%, confirming hypovolemia. Creatinine stable. No I/Os charted thus far. Currently on furosemide  40 mg IV BID. Consider holding furosemide  for hypovolemia resulting in symptomatic hypotension. -Hemodynamics: BP low. Patient is symptomatic. HR 80-90s. Hypovolemia likely contributing to low BP. REDS was 24%. May require medication dose  reduction until hypovolemia is resolved. -BB: Currently on carvedilol  3.125 mg BID. Cocaine use and COPD noted. May benefit from holding at this time given that this blocks alpha-1 receptor and midodrine agonizes alpha-1 receptor. -ACEI/ARB/ARNI: Consider holding losartan  12.5 mg daily with symptomatic hypotension. -MRA: Can delay next dose of spironolactone  x 1 day. This has minimal BP effect, though it could exacerbate hypovolemia. -SGLT2i: Can delay next dose Farxiga  by one day to allow recovery of volume. -Midodrine carries the risk of  severe HTN with cocaine use. Will require stopping prior to discharge. DDI noted with carvedilol  as outlines above.  Plan: 1) Medication changes recommended at this time: - Consider holding furosemide  - Consider holding carvedilol  while on midodrine. - Consider holding losartan  while on midodrine. Could stop midodrine once BP/hypovolemia resolves.  2) Patient assistance: -Entresto and Farxiga  are $4  3) Education: - Patient has been educated on current HF medications and potential additions to HF medication regimen - Patient verbalizes understanding that over the next few months, these medication doses may change and more medications may be added to optimize HF regimen - Patient has been educated on basic disease state pathophysiology and goals of therapy  Medication Assistance / Insurance Benefits Check: Does the patient have prescription insurance?    Type of insurance plan:  Does the patient qualify for medication assistance through manufacturers or grants? Pending   Outpatient Pharmacy: Prior to admission outpatient pharmacy: [facility]     Please do not hesitate to reach out with questions or concerns,  Bevely Brush, PharmD, CPP, BCPS Heart Failure Pharmacist  Phone - [phone] 01/07/2024 2:55 PM

## 2024-01-07 NOTE — Plan of Care (Signed)

## 2024-01-07 NOTE — Significant Event (Incomplete)
 CROSS COVER NOTE  NAME: Penny Gill MRN: [id number] DOB : 07-04-1961 ATTENDING PHYSICIAN: Alphonsus Jeans, MD    Date of Service   01/07/2024   HPI/Events of Note   Rapid response called due to altered mental status and inability to get blood pressure  HPI Patient admitted 5/13 for CHF exacerbation as well as pneumonia with hypoxic resp failure since which she was weaned from 2 L ro room air.  Last dose of lasix  1730 reports of good urine output but no intalke or output documented. Large explosive diarrhea episode prior to event Last echo 10/2023  systolic failureglobal hypokinesis, EF 30-35% mild   Interventions   Assessment/Plan: Initially zoll showing ST occ PVC, QT appears prolonged Patient responsive with her head down with report of inability to breath. Patient transferred to ICU started on peripheral levo BBS diminished but clear Mentation improved with pressure improvement but still c/o abdominal, diffuse abdominal pain Holosystolic murmur II/VI  Abdomen non distended No peripheral edema or JVD, dry mucous membranes  Latest Reference Range & Units 01/07/24 19:58  Sodium 135 - 145 mmol/L 135  Potassium 3.5 - 5.1 mmol/L 4.5  Chloride 98 - 111 mmol/L 104  CO2 22 - 32 mmol/L 18 (L)  Glucose 70 - 99 mg/dL 409 (H)  BUN 8 - 23 mg/dL 32 (H)  Creatinine 8.11 - 1.00 mg/dL 9.14 (H)  Calcium  8.9 - 10.3 mg/dL 7.9 (L)  Anion gap 5 - 15  13  Phosphorus 2.5 - 4.6 mg/dL 4.3  Magnesium  1.7 - 2.4 mg/dL 2.1  Alkaline Phosphatase 38 - 126 U/L 269 (H)  Albumin 3.5 - 5.0 g/dL 2.4 (L)  Lipase 11 - 51 U/L 31  AST 15 - 41 U/L 677 (H)  ALT 0 - 44 U/L 321 (H)  Total Protein 6.5 - 8.1 g/dL 5.4 (L)  Total Bilirubin 0.0 - 1.2 mg/dL 1.0  GFR, Estimated >78 mL/min 42 (L)  (L): Data is abnormally low (H): Data is abnormally high   Latest Reference Range & Units 01/07/24 19:18  Delivery systems  NON-REBREATHER OXYGEN MASK  O2 Content L/min 15.0  pH, Arterial 7.35 - 7.45  7.37   pCO2 arterial 32 - 48 mmHg 28 (L)  pO2, Arterial 83 - 108 mmHg 413 (H)  Acid-base deficit 0.0 - 2.0 mmol/L 7.6 (H)  Bicarbonate 20.0 - 28.0 mmol/L 16.2 (L)  O2 Saturation % 100  Patient temperature  37.0  Collection site  LEFT RADIAL  Allens test (pass/fail) PASS  PASS  (L): Data is abnormally low (H): Data is abnormally high  Latest Reference Range & Units 01/07/24 19:58  WBC 4.0 - 10.5 K/uL 6.9  RBC 3.87 - 5.11 MIL/uL 3.79 (L)  Hemoglobin 12.0 - 15.0 g/dL 29.5 (L)  HCT 62.1 - 30.8 % 34.3 (L)  MCV 80.0 - 100.0 fL 90.5  MCH 26.0 - 34.0 pg 29.0  MCHC 30.0 - 36.0 g/dL 65.7  RDW 84.6 - 96.2 % 12.6  Platelets 150 - 400 K/uL 237  nRBC 0.0 - 0.2 % 0.0  Neutrophils % 73  Lymphocytes % 19  Monocytes Relative % 7  Eosinophil % 1  Basophil % 0  Immature Granulocytes % 0  NEUT# 1.7 - 7.7 K/uL 4.9  Lymphs Abs 0.7 - 4.0 K/uL 1.3  Monocyte # 0.1 - 1.0 K/uL 0.5  Eosinophils Absolute 0.0 - 0.5 K/uL 0.1  Basophils Absolute 0.0 - 0.1 K/uL 0.0  Abs Immature Granulocytes 0.00 - 0.07 K/uL  0.03  (L): Data is abnormally low X X X

## 2024-01-07 NOTE — Consult Note (Signed)
 NAME:  Penny Gill, MRN:  [id number], DOB:  December 20, 1960, LOS: 2 ADMISSION DATE:  01/05/2024, CONSULTATION DATE:  01/07/24 REFERRING MD:  Rogue Clear, NP, CHIEF COMPLAINT:  Respiratory Distress  History of Present Illness:  63 yo F presenting to [facility] ED from home via EMS on 01/05/24 for evaluation of respiratory distress.  History obtained per chart review and patient bedside report. Patient reports 2 days of progressive shortness of breath/ wheezing and cough with correlating increased BLE edema & weight gain. She denies fever, chills, chest pain, palpitations, dizziness or loss of consciousness.   Of note she was recently hospitalized in March 2025 with CHF exacerbation and was supposed to follow up with [facility] cardiology but that did not happen. She was re-admitted in late April 2025 after using cocaine with a CHF exacerbation and underwent a cardiac catheterization on 4/29 which was consistent with a NICM.  Patient ***  EMS administered solumedrol & nebulizer  ED course: Upon arrival patient alert/drowsy and responsive with tachypnea, tachycardia and hypertension also hypoxic on 2 L [place]. Work up included IV lasix , steroids and neb treatments. Labs significant for BNP: 1852, and elevated Troponin: 235 > 380 as well as leukocytosis with a negative PCT. Imaging suggestive of CAP vs pulmonary edema. TRH consulted for admission.  Initial Vitals: 98.1, 24, 120, 170/112 & 88% on 2 L [place]  Hospital Course: See significant hospital events for details.  Most recent Significant labs: (Labs/ Imaging personally reviewed) I, Recardo Canal Rust-Chester, AGACNP-BC, personally viewed and interpreted this ECG. EKG Interpretation: Date: 01/07/24, EKG Time: 20:17, Rate: 86, Rhythm: ectopic atrial rhythm, QRS Axis: RAD, Intervals: prolonged Qtc, ST/T Wave abnormalities: N/A, Narrative Interpretation: ectopic atrial rhythm with prolonged Qtc Chemistry: Na+: 135, K+: 4.5, BUN/Cr.: 32/ 1.41, Serum CO2/ AG: 18/ 13, alk  Phos: 269, AST/ ALT: 677/ 321, albumin: 2.4 Hematology: WBC: 6.9, Hgb: 11,  Troponin: 235 > 380 > 135, BNP: 1852.3, Lactic/ PCT: ***, COVID-19 & Influenza A/B: negative ABG: *** CXR ***: *** CT ***: ***  PCCM consulted for admission due to ***.  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 rate 17, height 5' (1.524 m), weight 33.8 kg, SpO2 100%.        Intake/Output Summary (Last 24 hours) at 01/07/2024 2034 Last data filed at 01/07/2024 2000 Gross per 24 hour  Intake 1246.93 ml  Output --  Net 1246.93 ml   Filed Weights   01/05/24 1249 01/07/24 0500 01/07/24 2000  Weight: 35.5 kg 33 kg 33.8 kg    Examination: General: Adult ***, critically***acutely ill, lying in bed intubated & sedated requiring mechanical ventilation *** NAD HEENT: MM pink/moist, anicteric***, atraumatic, neck supple Neuro: A&O x *** commands, PERRL *** , MAE CV: s1s2 ***RRR, *** on monitor, no r/m/g Pulm: Regular, non labored on *** , breath sounds ***-BUL & ***-BLL GI: soft, ***, non***tender, bs x 4 GU: foley in place *** with clear yellow urine Skin: *** no rashes/lesions noted Extremities: warm/dry, pulses + 2 R/P, *** edema noted  Resolved problem list   Assessment and Plan  Acute Hypoxic/ *** Hypercapnic Respiratory Failure secondary to *** PMHx: *** - Continue BIPAP/***HHFNC overnight, wean FiO2 as tolerated - Supplemental O2 to maintain SpO2 > 90%***88% - Intermittent chest x-ray & ABG PRN*** - Ensure adequate pulmonary hygiene   - F/u cultures, trend PCT - Continue CAP/***Aspiration Pna coverage: cefepime/***unasyn - budesonide inhaler/***nebs BID, bronchodilators PRN  Acute hypoxic respiratory faliure secodary to acute decompensated systolic (HFrEF) heart failure:  Acute on Chronic HFrEF exacerbation PMHx: CHF*** On admission EKG: ***Chest x-ray:*** ECHO:*** Troponins:*** BNP:*** - Echocardiogram ordered - f/u BNP, lipid panel - Continuous cardiac monitoring (a-fib RVR can be major cause for HF) - Strict I/O's: alert provider if UOP < 0.5 mL/kg/hr - Daily BMP, replace electrolytes PRN - Daily weights to assess volume status - Diurese with the use of IV lasix   (if taking lasix  at home or have renal failure double the dose) - Supplemental oxygen as needed, maintain SpO2 > 90% - Consider NPPV with BIPAP thereapy as needed  - Cardiology consulted, appreciate input***   - Cardiac diet with sodium restriction -1500 Fluid restriction to decrease volume overload  -ensure patient on beta-blocker, typically carvedilol  at as it is nonselective - ***also ensure pt is on ACE or ARB ( geneaally lisinopril low dose 2.5 or 5 to start)  - ***Ensure pt is on ASA - continue Statin: *** -antihypertensives to promote adequate blood pressure in order to reduce left ventricular afterload thereby improving cardiac functions and decrease progression of cardiac remodeling. Beta blocker, ACE (or ARB or ARN) and MRA (if indicated) should be used   Uncontrolled***Poorly controlled Type 1***2 Diabetes Mellitus Steroid Induced Hyperglycemia Hemoglobin A1C:*** - Monitor CBG Q 4 hours***AC, HS - SSI sensitive***moderate***resistant dosing - target range while in ICU: 140-180 - follow ICU hyper/hypo-glycemia protocol - consult Diabetes Coordinator***    Best Practice (right click and "Reselect all SmartList Selections" daily)  Diet/type: {diet type:25684} DVT prophylaxis {anticoagulation:25687} Pressure ulcer(s): {pressure  ulcer(s):31683} GI prophylaxis: {ZO:10960} Lines: {Central Venous Access:25771} Foley:  {Central Venous Access:25691} Code Status:  {Code Status:26939} Last date of multidisciplinary goals of care discussion [***]  Labs   CBC: Recent Labs  Lab 01/05/24 1438 01/07/24 0445 01/07/24 1958  WBC 14.9* 16.8* 6.9  NEUTROABS  --   --  4.9  HGB 12.5 12.1 11.0*  HCT 37.8 35.8* 34.3*  MCV 89.2 87.5 90.5  PLT 307 265 237    Basic Metabolic Panel: Recent Labs  Lab 01/05/24 1438 01/06/24 0338 01/07/24 0445  NA 142 142 138  K 3.5 3.2* 3.0*  CL 106 108 104  CO2 22 25 26   GLUCOSE 105* 113* 98  BUN 22 24* 29*  CREATININE 0.91 1.14* 1.04*  CALCIUM  9.2 8.7* 8.0*  MG  --  1.7  --    GFR: Estimated Creatinine Clearance: 29.9 mL/min (A) (by C-G formula based on SCr of 1.04 mg/dL (H)). Recent Labs  Lab 01/05/24 1438 01/07/24 0445 01/07/24 1958  PROCALCITON 0.10  --   --   WBC 14.9* 16.8* 6.9    Liver Function Tests: No results for input(s): "AST", "ALT", "ALKPHOS", "BILITOT", "PROT", "ALBUMIN" in the last 168 hours. No results for input(s): "LIPASE", "AMYLASE" in the last 168 hours. No results for input(s): "AMMONIA" in the last 168 hours.  ABG    Component Value Date/Time   PHART 7.37 01/07/2024 1918   PCO2ART 28 (L) 01/07/2024 1918   PO2ART 413 (H) 01/07/2024 1918   HCO3 16.2 (L) 01/07/2024 1918   TCO2 30 12/22/2023 1102  ACIDBASEDEF 7.6 (H) 01/07/2024 1918   O2SAT 100 01/07/2024 1918     Coagulation Profile: No results for input(s): "INR", "PROTIME" in the last 168 hours.  Cardiac Enzymes: No results for input(s): "CKTOTAL", "CKMB", "CKMBINDEX", "TROPONINI" in the last 168 hours.  HbA1C: Hgb A1c MFr Bld  Date/Time Value Ref Range Status  11/15/2023 04:09 AM 5.8 (H) 4.8 - 5.6 % Final    Comment:    (NOTE)         Prediabetes: 5.7 - 6.4         Diabetes: >6.4         Glycemic control for adults with diabetes: <7.0     CBG: Recent Labs  Lab 01/07/24 1906  01/07/24 1919 01/07/24 2023  GLUCAP 177* 205* 290*    Review of Systems: positives in BOLD***  Gen: Denies fever, chills, weight change, fatigue, night sweats HEENT: Denies blurred vision, double vision, hearing loss, tinnitus, sinus congestion, rhinorrhea, sore throat, neck stiffness, dysphagia PULM: Denies shortness of breath, cough, sputum production, hemoptysis, wheezing CV: Denies chest pain, edema, orthopnea, paroxysmal nocturnal dyspnea, palpitations GI: Denies abdominal pain, nausea, vomiting, diarrhea, hematochezia, melena, constipation, change in bowel habits GU: Denies dysuria, hematuria, polyuria, oliguria, urethral discharge Endocrine: Denies hot or cold intolerance, polyuria, polyphagia or appetite change Derm: Denies rash, dry skin, scaling or peeling skin change Heme: Denies easy bruising, bleeding, bleeding gums Neuro: Denies headache, numbness, weakness, slurred speech, loss of memory or consciousness  Past Medical History:  She,  has a past medical history of COPD (chronic obstructive pulmonary disease) (HCC), Depression, Hyperlipidemia, Hypertension, Kidney stone, Osteoporosis, and Sickle cell trait (HCC).   Surgical History:   Past Surgical History:  Procedure Laterality Date   RIGHT/LEFT HEART CATH AND CORONARY ANGIOGRAPHY N/A 12/22/2023   Procedure: RIGHT/LEFT HEART CATH AND CORONARY ANGIOGRAPHY;  Surgeon: Sammy Crisp, MD;  Location: [facility] INVASIVE CV LAB;  Service: Cardiovascular;  Laterality: N/A;   TUBAL LIGATION       Social History:   reports that she has been smoking cigarettes and e-cigarettes. She has never used smokeless tobacco. She reports that she does not currently use alcohol. She reports that she does not currently use drugs after having used the following drugs: Marijuana and Cocaine.   Family History:  Her family history includes Breast cancer (age of onset: 67) in her maternal aunt.   Allergies No Known Allergies   Home Medications   Prior to Admission medications   Medication Sig Start Date End Date Taking? Authorizing Provider  acetaminophen  (TYLENOL ) 325 MG tablet Take 2 tablets (650 mg total) by mouth every 6 (six) hours as needed for mild pain (pain score 1-3), moderate pain (pain score 4-6), fever or headache (or Fever >/= 101). 12/23/23  Yes Melodi Sprung, DO  aspirin  EC 81 MG tablet Take 1 tablet (81 mg total) by mouth daily. Swallow whole. 11/18/23  Yes Darus Engels A, DO  atorvastatin  (LIPITOR) 40 MG tablet Take 1 tablet (40 mg total) by mouth at bedtime. 11/17/23  Yes Montey Apa, DO  carvedilol  (COREG ) 3.125 MG tablet Take 1 tablet (3.125 mg total) by mouth 2 (two) times daily with a meal. 12/23/23  Yes Melodi Sprung, DO  dapagliflozin  propanediol (FARXIGA ) 10 MG TABS tablet Take 1 tablet (10 mg total) by mouth daily. 12/24/23  Yes Alexander, Natalie, DO  DULoxetine (CYMBALTA) 30 MG capsule Take 1 capsule by mouth daily. 11/02/23 11/01/24 Yes [provider]  folic acid  (FOLVITE ) 1 MG tablet Take 1  tablet (1 mg total) by mouth daily. 12/24/23  Yes Alexander, Natalie, DO  furosemide  (LASIX ) 20 MG tablet Take 1 tablet (20 mg total) by mouth daily as needed for fluid. Weight > 3 lb in a 24 hour period. 12/23/23  Yes Alexander, Natalie, DO  gabapentin  (NEURONTIN ) 300 MG capsule Take 300 mg by mouth 4 (four) times daily.   Yes [provider]  losartan  (COZAAR ) 25 MG tablet Take 0.5 tablets (12.5 mg total) by mouth daily. 12/24/23  Yes Alexander, Natalie, DO  spironolactone  (ALDACTONE ) 25 MG tablet Take 0.5 tablets (12.5 mg total) by mouth daily. 11/18/23  Yes Darus Engels A, DO  thiamine  (VITAMIN B-1) 100 MG tablet Take 1 tablet (100 mg total) by mouth daily. 12/24/23  Yes Alexander, Natalie, DO  tiZANidine (ZANAFLEX) 2 MG tablet Take 2 mg by mouth every 8 (eight) hours as needed for muscle spasms.   Yes [provider]  feeding supplement (ENSURE ENLIVE / ENSURE PLUS) LIQD Take 237 mLs  by mouth 2 (two) times daily between meals. Patient not taking: Reported on 12/28/2023 11/17/23   Darus Engels A, DO  Multiple Vitamin (MULTIVITAMIN WITH MINERALS) TABS tablet Take 1 tablet by mouth daily. Patient not taking: Reported on 12/28/2023 12/24/23   Alexander, Natalie, DO  nicotine  (NICODERM CQ  - DOSED IN MG/24 HR) 7 mg/24hr patch Place 1 patch (7 mg total) onto the skin daily. Patient not taking: Reported on 12/28/2023 12/24/23   Alexander, Natalie, DO     Critical care time: ***        Recardo Canal Rust-Chester, AGACNP-BC Acute Care Nurse Practitioner [facility]   [phone] / [phone] Please see Amion for details.

## 2024-01-07 NOTE — Progress Notes (Signed)
 Pt's blood pressure consistently low. Manual BP of 80/60. Pt reported that she still feels tired. Informed Dr. Achilles Holes via secure chat.  [phone].Bolus of 250mL ordered and given. BP 86/66 after the bolus. Dr. Achilles Holes made aware.  0139H. 2nd bolus of 250mL was ordered and given. Bolus was put on hold because IV access was occluded and left arm is swollen. Another IV access inserted and kept left arm elevated. Midodrine 10mg  tablet given.  0157H. BP: 92/72 after the second bolus.

## 2024-01-07 NOTE — Progress Notes (Signed)
 Rapid Response Event Note   Reason for Call : Hypotension  Initial Focused Assessment: On my arrival pt is minimally responsive. Will respond to sternal rub. Pt's BP 66/50. Primary RN states pt is here for CHF. She had previously been up to bathroom and had diarrhea. Pt started to respond but is saying she could not breathe and sitting straight up in bed. Pt remains hypotensive.   Interventions: CBG WNL. RT placed pt on nonrebreather, O2 sats 100%. NS bolus started per Elisabeth Guild, NP at bedside. Pt remains hypotensive despite bolus. Pt also remains Shob. Pt saying that she is having abdominal pain. ABG ordered. Transfer to ICU ordered. Labs ordered. Chest xray ordered.   Plan of Care: Pt transferred to ICU 19. Levophed started. RT at bedside drawing ABG. 12 lead EKG performed per order. 2nd IV placed. Elisabeth Guild, NP remains at bedside. ICU consulted. 1 amp bicarb given.  Event Summary: 2045- Pt states she is feeling much better. She remains drowsy. BP  97/70 (81). 100% on 2L [place]. HR 85. Levophed 12 mcg, titrating down.   MD Notified: Elisabeth Guild, NP Call Time: [phone] Arrival Time: 1906 End Time: Pt transferred to ICU 19  Orpha Blade, RN

## 2024-01-07 NOTE — Progress Notes (Signed)
 eLink Physician-Brief Progress Note Patient Name: Penny Gill DOB: 1961-07-30 MRN: [id number]   Date of Service  01/07/2024  HPI/Events of Note  Patient admitted with exacerbation of CHF, acute hypoxemic respiratory failure, elevated Troponin likely secondary to demand ischemia. Bilateral interstitial infiltrates also raise the question of pneumonia.  eICU Interventions  New Patient Evaluation.        [name] U Aking Klabunde 01/07/2024, 8:08 PM

## 2024-01-07 NOTE — Progress Notes (Addendum)
 PROGRESS NOTE    Penny Gill  ZOX:[id number] DOB: June 22, 1961 DOA: 01/05/2024 PCP: Pcp, No   Assessment & Plan:   Principal Problem:   Acute on chronic HFrEF (heart failure with reduced ejection fraction) (HCC)  Assessment and Plan:  Acute on chronic combined CHF: continue on IV lasix  & continue on losartan , coreg  farxiga . Monitor I/Os    Acute hypoxic respiratory failure: initial oxygen saturation was 88% on 2 L of oxygen. Weaned off of supplemental oxygen. Resolved  Possible CAP: continue on IV rocephin , azithromycin, bronchodilators & encourage incentive spirometry   Hypokalemia: potassium given    Elevated troponins: likely secondary to demand ischemia & cocaine use. No CP. Right and left heart cath in March 2025 showed normal coronaries.  Cocaine abuse: urine drug screen was positive for cocaine. Received cessation counseling already. Pt stated she quit using cocaine despite her urine drug screen being positive for cocaine    Fever: likely secondary to CAP. Blood cxs NGTD. UA showed large leukocytes but rare bacteria. No fever today so far    COPD: w/o exacerbation. Bronchodilators prn        DVT prophylaxis: lovenox  Code Status: full  Family Communication:  Disposition Plan: likely d/c back to nursing home   Status is: Inpatient Remains inpatient appropriate because: severity of illness    Level of care: Progressive Consultants:    Procedures:    Antimicrobials:   Subjective: Pt c/o fatigue   Objective: Vitals:   01/07/24 0157 01/07/24 0353 01/07/24 0500 01/07/24 0719  BP: 92/72 92/66  92/75  Pulse: 98 84  84  Resp:  20    Temp:  98.5 F (36.9 C)  98.1 F (36.7 C)  TempSrc:  Oral    SpO2:  97%  98%  Weight:   33 kg   Height:       No intake or output data in the 24 hours ending 01/07/24 0839 Filed Weights   01/05/24 1249 01/07/24 0500  Weight: 35.5 kg 33 kg    Examination:  General exam: appears comfortable   Respiratory system:  decreased breath sounds b/l  Cardiovascular system: S1/S2+. No rubs, gallops or clicks. Gastrointestinal system: Abdomen is nondistended, soft and nontender. Normal bowel sounds heard. Central nervous system: lethargic. Moves all extremities  Psychiatry: Judgement and insight appears at baseline. Flat mood and affect   Data Reviewed: I have personally reviewed following labs and imaging studies  CBC: Recent Labs  Lab 01/05/24 1438 01/07/24 0445  WBC 14.9* 16.8*  HGB 12.5 12.1  HCT 37.8 35.8*  MCV 89.2 87.5  PLT 307 265   Basic Metabolic Panel: Recent Labs  Lab 01/05/24 1438 01/06/24 0338 01/07/24 0445  NA 142 142 138  K 3.5 3.2* 3.0*  CL 106 108 104  CO2 22 25 26   GLUCOSE 105* 113* 98  BUN 22 24* 29*  CREATININE 0.91 1.14* 1.04*  CALCIUM  9.2 8.7* 8.0*  MG  --  1.7  --    GFR: Estimated Creatinine Clearance: 29.2 mL/min (A) (by C-G formula based on SCr of 1.04 mg/dL (H)). Liver Function Tests: No results for input(s): "AST", "ALT", "ALKPHOS", "BILITOT", "PROT", "ALBUMIN" in the last 168 hours. No results for input(s): "LIPASE", "AMYLASE" in the last 168 hours. No results for input(s): "AMMONIA" in the last 168 hours. Coagulation Profile: No results for input(s): "INR", "PROTIME" in the last 168 hours. Cardiac Enzymes: No results for input(s): "CKTOTAL", "CKMB", "CKMBINDEX", "TROPONINI" in the last 168 hours. BNP (last 3 results) No  results for input(s): "PROBNP" in the last 8760 hours. HbA1C: No results for input(s): "HGBA1C" in the last 72 hours. CBG: No results for input(s): "GLUCAP" in the last 168 hours. Lipid Profile: No results for input(s): "CHOL", "HDL", "LDLCALC", "TRIG", "CHOLHDL", "LDLDIRECT" in the last 72 hours. Thyroid  Function Tests: No results for input(s): "TSH", "T4TOTAL", "FREET4", "T3FREE", "THYROIDAB" in the last 72 hours. Anemia Panel: No results for input(s): "VITAMINB12", "FOLATE", "FERRITIN", "TIBC", "IRON", "RETICCTPCT" in the last 72  hours. Sepsis Labs: Recent Labs  Lab 01/05/24 1438  PROCALCITON 0.10    Recent Results (from the past 240 hours)  Culture, blood (Routine X 2) w Reflex to ID Panel     Status: None (Preliminary result)   Collection Time: 01/05/24 11:46 PM   Specimen: BLOOD  Result Value Ref Range Status   Specimen Description BLOOD RIGHT FA  Final   Special Requests   Final    BOTTLES DRAWN AEROBIC AND ANAEROBIC Blood Culture results may not be optimal due to an inadequate volume of blood received in culture bottles   Culture   Final    NO GROWTH < 12 HOURS Performed at [facility], [address] [place], [place] [address]    Report Status PENDING  Incomplete  Culture, blood (Routine X 2) w Reflex to ID Panel     Status: None (Preliminary result)   Collection Time: 01/05/24 11:46 PM   Specimen: BLOOD  Result Value Ref Range Status   Specimen Description BLOOD LEFT FA  Final   Special Requests   Final    BOTTLES DRAWN AEROBIC AND ANAEROBIC Blood Culture results may not be optimal due to an inadequate volume of blood received in culture bottles   Culture   Final    NO GROWTH < 12 HOURS Performed at [facility], [address], [place], [place] [address]    Report Status PENDING  Incomplete         Radiology Studies: DG Chest 2 View Result Date: 01/05/2024 CLINICAL DATA:  Shortness of breath EXAM: CHEST - 2 VIEW COMPARISON:  X-ray 12/20/2023 FINDINGS: Stable cardiopericardial silhouette. No pneumothorax or effusion. Prominent central vasculature. Increasing lung base parenchymal interstitial opacities. Acute infiltrate is possible. Overlapping cardiac leads. Degenerative changes of the spine. IMPRESSION: Increasing lung base parenchymal interstitial opacities. Acute process is possible. Recommend follow-up. Electronically Signed   By: Adrianna Horde M.D.   On: 01/05/2024 14:25        Scheduled Meds:  atorvastatin   40 mg Oral QHS   carvedilol   3.125 mg Oral BID WC    dapagliflozin  propanediol  10 mg Oral Daily   DULoxetine  30 mg Oral Daily   enoxaparin  (LOVENOX ) injection  30 mg Subcutaneous Q24H   furosemide   40 mg Intravenous BID   losartan   12.5 mg Oral Daily   midodrine  10 mg Oral TID WC   potassium chloride   40 mEq Oral BID   sodium chloride  flush  3 mL Intravenous Q12H   spironolactone   12.5 mg Oral Daily   Continuous Infusions:  azithromycin 500 mg (01/06/24 2229)   cefTRIAXone  (ROCEPHIN )  IV 1 g (01/06/24 2155)     LOS: 2 days       Alphonsus Jeans, MD [facility] Pager [phone]  If 7PM-7AM, please contact night-coverag 01/07/2024, 8:39 AM

## 2024-01-07 NOTE — Evaluation (Signed)
 Physical Therapy Evaluation Patient Details Name: Penny Gill MRN: [id number] DOB: 17-Sep-1960 Today's Date: 01/07/2024  History of Present Illness  Pt is a 63 y.o. female presenting to hospital 01/05/24 with c/o SOB, leg swelling, and weight gain.  Pt admitted with acute on chronic HFrEF and acute hypoxic respiratory failure.  PMH includes chronic HFrEF, COPD, cocaine use disorder, htn, HLD.  Clinical Impression  Prior to recent medical concerns, pt reports being independent with functional mobility (uses RW occasionally as needed); h/o falls.  No c/o pain or dizziness during session (pre-ambulation in sitting pt's BP 94/67 (MAP 76) with HR 93 bpm and SpO2 sats 98% on room air; post ambulation in sitting pt's BP 94/73 (MAP 81) with HR 94 bpm and SpO2 sats 98% on room air).  Currently pt is modified independent with bed mobility; independent with transfers; and SBA with ambulation (pt appearing more cautious ambulating without UE support compared to with RW although pt reports preferring not to use RW in general).  Pt would currently benefit from skilled PT to address noted impairments and functional limitations (see below for any additional details).  Upon hospital discharge, pt would benefit from ongoing therapy.     If plan is discharge home, recommend the following: A little help with walking and/or transfers;A little help with bathing/dressing/bathroom;Assistance with cooking/housework;Assist for transportation;Help with stairs or ramp for entrance   Can travel by private vehicle    Yes    Equipment Recommendations Rolling walker (2 wheels) (pt has RW at home already)  Recommendations for Other Services       Functional Status Assessment Patient has had a recent decline in their functional status and demonstrates the ability to make significant improvements in function in a reasonable and predictable amount of time.     Precautions / Restrictions Precautions Precautions:  Fall Restrictions Weight Bearing Restrictions Per Provider Order: No      Mobility  Bed Mobility Overal bed mobility: Modified Independent Bed Mobility: Supine to Sit, Sit to Supine     Supine to sit: Modified independent (Device/Increase time), HOB elevated Sit to supine: Modified independent (Device/Increase time), HOB elevated   General bed mobility comments: no difficulties noted    Transfers Overall transfer level: Needs assistance Equipment used: None Transfers: Sit to/from Stand Sit to Stand: Independent           General transfer comment: steady transfer from bed x2 trials    Ambulation/Gait Ambulation/Gait assistance: Supervision Gait Distance (Feet):  (10 feet x2 (to/from bathroom); 120 feet no AD use; 50 feet with RW use) Assistive device: Rolling walker (2 wheels), None   Gait velocity: decreased     General Gait Details: pt appearing more cautious with ambulation without UE support (slower ambulation); pt appearing more confident ambulating with RW use (although pt reports she does not prefer to use RW)  Stairs            Wheelchair Mobility     Tilt Bed    Modified Rankin (Stroke Patients Only)       Balance Overall balance assessment: Needs assistance Sitting-balance support: No upper extremity supported, Feet supported Sitting balance-Leahy Scale: Normal Sitting balance - Comments: steady reaching outside BOS   Standing balance support: No upper extremity supported Standing balance-Leahy Scale: Good Standing balance comment: steady managing underwear for toileting                             Pertinent  Vitals/Pain Pain Assessment Pain Assessment: No/denies pain Pain Intervention(s): Limited activity within patient's tolerance, Monitored during session    Home Living Family/patient expects to be discharged to:: Private residence Living Arrangements: Alone (apt downstairs in care home)   Type of Home: House Home  Access: Ramped entrance     Alternate Level Stairs-Number of Steps: 12 steps with R railing down to apt Home Layout: Two level (Lives in downstairs apt) Home Equipment: Agricultural consultant (2 wheels)      Prior Function Prior Level of Function : History of Falls (last six months)             Mobility Comments: Independent with ambulation; pt reports using RW when lightheaded/dizzy; last fall 2-3 weeks ago.       Extremity/Trunk Assessment   Upper Extremity Assessment Upper Extremity Assessment: Overall WFL for tasks assessed    Lower Extremity Assessment Lower Extremity Assessment: Generalized weakness    Cervical / Trunk Assessment Cervical / Trunk Assessment: Normal  Communication   Communication Communication: No apparent difficulties    Cognition Arousal: Alert Behavior During Therapy: WFL for tasks assessed/performed   PT - Cognitive impairments: No apparent impairments                         Following commands: Intact       Cueing Cueing Techniques: Verbal cues     General Comments  Nursing cleared pt for participation in physical therapy.  Pt agreeable to PT session.    Exercises     Assessment/Plan    PT Assessment Patient needs continued PT services  PT Problem List Decreased strength;Decreased activity tolerance;Decreased balance;Decreased mobility       PT Treatment Interventions DME instruction;Gait training;Stair training;Functional mobility training;Therapeutic activities;Therapeutic exercise;Balance training;Patient/family education    PT Goals (Current goals can be found in the Care Plan section)  Acute Rehab PT Goals Patient Stated Goal: feel fetter PT Goal Formulation: With patient Time For Goal Achievement: 01/21/24 Potential to Achieve Goals: Good    Frequency Min 1X/week     Co-evaluation               AM-PAC PT "6 Clicks" Mobility  Outcome Measure Help needed turning from your back to your side while in a  flat bed without using bedrails?: None Help needed moving from lying on your back to sitting on the side of a flat bed without using bedrails?: None Help needed moving to and from a bed to a chair (including a wheelchair)?: A Little Help needed standing up from a chair using your arms (e.g., wheelchair or bedside chair)?: A Little Help needed to walk in hospital room?: A Little Help needed climbing 3-5 steps with a railing? : A Little 6 Click Score: 20    End of Session Equipment Utilized During Treatment: Gait belt Activity Tolerance: Patient tolerated treatment well Patient left: in bed;with call bell/phone within reach Nurse Communication: Mobility status;Precautions;Other (comment) (pt declining bed alarm (nurse reports pt has been going to bathroom independently)) PT Visit Diagnosis: Unsteadiness on feet (R26.81);History of falling (Z91.81);Other abnormalities of gait and mobility (R26.89);Muscle weakness (generalized) (M62.81)    Time: 0981-1914 PT Time Calculation (min) (ACUTE ONLY): 24 min   Charges:   PT Evaluation $PT Eval Low Complexity: 1 Low PT Treatments $Gait Training: 8-22 mins PT General Charges $$ ACUTE PT VISIT: 1 Visit        Amador Junes, PT 01/07/24, 10:31 AM

## 2024-01-07 NOTE — Progress Notes (Signed)
 Arrived in pt's room at shift change. Day shift RN Margretta Shi at the bedside. Pt noted to be hypotensive and complaining that she could not breathe. Noted to be dry heaving. Gave Zofran . Rapid response called.Pt placed on NRB mask. Provider Elisabeth Guild, NP to the bedside.Pt immediately transferred to ICU.

## 2024-01-07 NOTE — Significant Event (Signed)
 CROSS COVER NOTE  NAME: Penny Gill MRN: [id number] DOB : 07/08/1961 ATTENDING PHYSICIAN: Alphonsus Jeans, MD    Date of Service   01/07/2024   HPI/Events of Note   Rapid response called due to altered mental status and inability to get blood pressure  HPI Patient admitted 5/13 for CHF exacerbation as well as pneumonia with hypoxic resp failure since which she was weaned from 2 L ro room air.  Last dose of lasix  1730 reports of good urine output but no intalke or output documented. Large explosive diarrhea episode prior to event Last echo 10/2023  systolic failure global hypokinesis, EF 30-35%; Cath 12/22/2023 non ischemic cardiomyopathy with normal filling pressure;upper normal pulmonary artery pressure with elevated vascular resistance  Interventions   Assessment/Plan: Initially zoll showing ST occ PVC, QT appears prolonged; bedside monitor in ICU measures 512 for QTc Patient responsive with her head down with report of inability to breath. Patient transferred to ICU started on peripheral levo BBS diminished but clear Mentation continued to  improve with levophed on board and bp improvement but still c/o abdominal, diffuse abdominal pain, non distended Holosystolic murmur II/VI  No peripheral edema or JVD, dry mucous membranes  Latest Reference Range & Units 01/07/24 19:58  Sodium 135 - 145 mmol/L 135  Potassium 3.5 - 5.1 mmol/L 4.5  Chloride 98 - 111 mmol/L 104  CO2 22 - 32 mmol/L 18 (L)  Glucose 70 - 99 mg/dL 952 (H)  BUN 8 - 23 mg/dL 32 (H)  Creatinine 8.41 - 1.00 mg/dL 3.24 (H)  Calcium  8.9 - 10.3 mg/dL 7.9 (L)  Anion gap 5 - 15  13  Phosphorus 2.5 - 4.6 mg/dL 4.3  Magnesium  1.7 - 2.4 mg/dL 2.1  Alkaline Phosphatase 38 - 126 U/L 269 (H)  Albumin 3.5 - 5.0 g/dL 2.4 (L)  Lipase 11 - 51 U/L 31  AST 15 - 41 U/L 677 (H)  ALT 0 - 44 U/L 321 (H)  Total Protein 6.5 - 8.1 g/dL 5.4 (L)  Total Bilirubin 0.0 - 1.2 mg/dL 1.0  GFR, Estimated >40 mL/min 42 (L)  (L):  Data is abnormally low (H): Data is abnormally high   Latest Reference Range & Units 01/07/24 19:18  Delivery systems  NON-REBREATHER OXYGEN MASK  O2 Content L/min 15.0  pH, Arterial 7.35 - 7.45  7.37  pCO2 arterial 32 - 48 mmHg 28 (L)  pO2, Arterial 83 - 108 mmHg 413 (H)  Acid-base deficit 0.0 - 2.0 mmol/L 7.6 (H)  Bicarbonate 20.0 - 28.0 mmol/L 16.2 (L)  O2 Saturation % 100  Patient temperature  37.0  Collection site  LEFT RADIAL  Allens test (pass/fail) PASS  PASS  (L): Data is abnormally low (H): Data is abnormally high  Latest Reference Range & Units 01/07/24 19:58  WBC 4.0 - 10.5 K/uL 6.9  RBC 3.87 - 5.11 MIL/uL 3.79 (L)  Hemoglobin 12.0 - 15.0 g/dL 10.2 (L)  HCT 72.5 - 36.6 % 34.3 (L)  MCV 80.0 - 100.0 fL 90.5  MCH 26.0 - 34.0 pg 29.0  MCHC 30.0 - 36.0 g/dL 44.0  RDW 34.7 - 42.5 % 12.6  Platelets 150 - 400 K/uL 237  nRBC 0.0 - 0.2 % 0.0  Neutrophils % 73  Lymphocytes % 19  Monocytes Relative % 7  Eosinophil % 1  Basophil % 0  Immature Granulocytes % 0  NEUT# 1.7 - 7.7 K/uL 4.9  Lymphs Abs 0.7 - 4.0 K/uL 1.3  Monocyte #  0.1 - 1.0 K/uL 0.5  Eosinophils Absolute 0.0 - 0.5 K/uL 0.1  Basophils Absolute 0.0 - 0.1 K/uL 0.0  Abs Immature Granulocytes 0.00 - 0.07 K/uL 0.03  (L): Data is abnormally low   Latest Reference Range & Units 01/07/24 19:58  D-Dimer, Quant 0.00 - 0.50 ug/mL-FEU 1.79 (H)  (H): Data is abnormally high   Latest Reference Range & Units 01/07/24 19:58 01/07/24 22:07  Troponin I (High Sensitivity) <18 ng/L 135 (HH) 1,015 (HH)  (HH): Data is critically high  CT angio r/o PE and CT abdomen/pelvis with contrast IMPRESSION: 1. Negative for acute pulmonary embolus or aortic dissection. Advanced emphysema. 2. Cardiomegaly. 3. Patchy hypoenhancement of the kidneys on delayed images, question pyelonephritis. Nonobstructing right kidney stones. Cortical scarring in the right kidney. 4. Mild gallbladder wall thickening or small volume  pericholecystic fluid. No calcified stones. Correlate with US  if indicated. 5. Small free fluid in the pelvis. 6. Stable 8.9 cm posterior pelvic mass, probably uterine fibroids given lack of interval change.   Aortic Atherosclerosis (ICD10-I70.0) and Emphysema (ICD10-J43.9).   Shock state -Hypotension with intravascular volume loss -cannot rule out worsening cardiomyopathy -critical care consulted for for central line with vasopressor therapy -TSH normal -cortisol level pending -hold lasix   -s/p 1 L NS given  Systolic murmur - new -repeat echo -consider cardiology consult - continue monitor on tele  Elevated troponin - -no chest pain and no ischemic change on EKG - likely demand -continue to trend.   Abdominal pain with watery diarrhea -GI PCR panel - consider c diff testing if watery diarrhea continues -enteric precautions  Prolonged Qtc - -512 initially on monitor -EKG borderline 497 -Azithromycin changed to doxycycline -Avoid other QT prolonging med -Monitor on tele -Monitor and replete K/mag goal above 4/2 respectively  Acute on chronic resp failure -continue to wean supplemental oxygen -continue CAP treatment with rocephin  and doxycycline  Severe emphysema -prn bronchodilators already ordered  Severe protein calorie malnutrition -ensure plus BID -RD consult  CRITICAL CARE Performed by: Elisabeth Guild   Total critical care time: 60 minutes  Critical care time was exclusive of separately billable procedures and treating other patients.  Critical care was necessary to treat or prevent imminent or life-threatening deterioration.  Critical care was time spent personally by me on the following activities: development of treatment plan with patient and/or surrogate as well as nursing, discussions with consultants, evaluation of patient's response to treatment, examination of patient, obtaining history from patient or surrogate, ordering and performing  treatments and interventions, ordering and review of laboratory studies, ordering and review of radiographic studies, pulse oximetry and re-evaluation of patient's condition.      Kip Peon NP [facility] Cross Cover 7pm-7am - check amion for availability Pager [phone]

## 2024-01-08 DIAGNOSIS — I5023 Acute on chronic systolic (congestive) heart failure: Secondary | ICD-10-CM | POA: Diagnosis not present

## 2024-01-08 DIAGNOSIS — R579 Shock, unspecified: Secondary | ICD-10-CM

## 2024-01-08 DIAGNOSIS — R7989 Other specified abnormal findings of blood chemistry: Secondary | ICD-10-CM

## 2024-01-08 DIAGNOSIS — J189 Pneumonia, unspecified organism: Secondary | ICD-10-CM | POA: Diagnosis not present

## 2024-01-08 LAB — BLOOD GAS, VENOUS
Acid-Base Excess: 1.8 mmol/L (ref 0.0–2.0)
Bicarbonate: 28.6 mmol/L — ABNORMAL HIGH (ref 20.0–28.0)
O2 Saturation: 74.1 %
Patient temperature: 37
pCO2, Ven: 53 mmHg (ref 44–60)
pH, Ven: 7.34 (ref 7.25–7.43)
pO2, Ven: 44 mmHg (ref 32–45)

## 2024-01-08 LAB — GLUCOSE, CAPILLARY
Glucose-Capillary: 104 mg/dL — ABNORMAL HIGH (ref 70–99)
Glucose-Capillary: 107 mg/dL — ABNORMAL HIGH (ref 70–99)
Glucose-Capillary: 114 mg/dL — ABNORMAL HIGH (ref 70–99)
Glucose-Capillary: 126 mg/dL — ABNORMAL HIGH (ref 70–99)
Glucose-Capillary: 157 mg/dL — ABNORMAL HIGH (ref 70–99)
Glucose-Capillary: 189 mg/dL — ABNORMAL HIGH (ref 70–99)
Glucose-Capillary: 96 mg/dL (ref 70–99)

## 2024-01-08 LAB — CBC
HCT: 34.2 % — ABNORMAL LOW (ref 36.0–46.0)
Hemoglobin: 11.2 g/dL — ABNORMAL LOW (ref 12.0–15.0)
MCH: 30 pg (ref 26.0–34.0)
MCHC: 32.7 g/dL (ref 30.0–36.0)
MCV: 91.7 fL (ref 80.0–100.0)
Platelets: 279 10*3/uL (ref 150–400)
RBC: 3.73 MIL/uL — ABNORMAL LOW (ref 3.87–5.11)
RDW: 12.6 % (ref 11.5–15.5)
WBC: 13.4 10*3/uL — ABNORMAL HIGH (ref 4.0–10.5)
nRBC: 0 % (ref 0.0–0.2)

## 2024-01-08 LAB — BASIC METABOLIC PANEL WITH GFR
Anion gap: 10 (ref 5–15)
BUN: 31 mg/dL — ABNORMAL HIGH (ref 8–23)
CO2: 25 mmol/L (ref 22–32)
Calcium: 8.3 mg/dL — ABNORMAL LOW (ref 8.9–10.3)
Chloride: 105 mmol/L (ref 98–111)
Creatinine, Ser: 1.09 mg/dL — ABNORMAL HIGH (ref 0.44–1.00)
GFR, Estimated: 57 mL/min — ABNORMAL LOW (ref >60)
Glucose, Bld: 122 mg/dL — ABNORMAL HIGH (ref 70–99)
Potassium: 4.2 mmol/L (ref 3.5–5.1)
Sodium: 140 mmol/L (ref 135–145)

## 2024-01-08 LAB — GASTROINTESTINAL PANEL BY PCR, STOOL (REPLACES STOOL CULTURE)

## 2024-01-08 LAB — HEPATIC FUNCTION PANEL
ALT: 306 U/L — ABNORMAL HIGH (ref 0–44)
AST: 289 U/L — ABNORMAL HIGH (ref 15–41)
Albumin: 2.6 g/dL — ABNORMAL LOW (ref 3.5–5.0)
Alkaline Phosphatase: 261 U/L — ABNORMAL HIGH (ref 38–126)
Bilirubin, Direct: 0.1 mg/dL (ref 0.0–0.2)
Indirect Bilirubin: 0.6 mg/dL (ref 0.3–0.9)
Total Bilirubin: 0.7 mg/dL (ref 0.0–1.2)
Total Protein: 5.9 g/dL — ABNORMAL LOW (ref 6.5–8.1)

## 2024-01-08 LAB — TROPONIN I (HIGH SENSITIVITY)
Troponin I (High Sensitivity): 4247 ng/L (ref <18)
Troponin I (High Sensitivity): 3994 ng/L (ref <18)

## 2024-01-08 LAB — LACTIC ACID, PLASMA: Lactic Acid, Venous: 0.8 mmol/L (ref 0.5–1.9)

## 2024-01-08 LAB — TSH: TSH: 1.529 u[IU]/mL (ref 0.350–4.500)

## 2024-01-08 LAB — CORTISOL: Cortisol, Plasma: 23.2 ug/dL

## 2024-01-08 MED ORDER — ENSURE ENLIVE PO LIQD
237.0000 mL | Freq: Three times a day (TID) | ORAL | Status: DC
  Administered 2024-01-08 – 2024-01-12 (×10): 237 mL via ORAL

## 2024-01-08 MED ORDER — ADULT MULTIVITAMIN W/MINERALS CH
1.0000 | ORAL_TABLET | Freq: Every day | ORAL | Status: DC
  Administered 2024-01-09 – 2024-01-12 (×4): 1 via ORAL
  Filled 2024-01-08 (×4): qty 1

## 2024-01-08 MED ORDER — FOLIC ACID 1 MG PO TABS
1.0000 mg | ORAL_TABLET | Freq: Every day | ORAL | Status: DC
  Administered 2024-01-09 – 2024-01-12 (×4): 1 mg via ORAL
  Filled 2024-01-08 (×4): qty 1

## 2024-01-08 MED ORDER — ENSURE ENLIVE PO LIQD
237.0000 mL | Freq: Two times a day (BID) | ORAL | Status: DC
  Administered 2024-01-08 (×2): 237 mL via ORAL

## 2024-01-08 MED ORDER — LACTATED RINGERS IV BOLUS
500.0000 mL | Freq: Once | INTRAVENOUS | Status: AC
  Administered 2024-01-08: 500 mL via INTRAVENOUS

## 2024-01-08 MED ORDER — THIAMINE HCL 100 MG PO TABS
100.0000 mg | ORAL_TABLET | Freq: Every day | ORAL | Status: DC
  Administered 2024-01-09 – 2024-01-12 (×4): 100 mg via ORAL
  Filled 2024-01-08 (×8): qty 1

## 2024-01-08 NOTE — Progress Notes (Signed)
 PROGRESS NOTE    Penny Gill  MVH:[id number] DOB: [birth year] DOA: 01/05/2024 PCP: Pcp, No   Assessment & Plan:   Principal Problem:   Acute on chronic HFrEF (heart failure with reduced ejection fraction) (HCC) Active Problems:   Shock (HCC)  Assessment and Plan:  Acute on chronic combined CHF: holding lasix , losartan , coreg ,  farxiga  secondary to hypotension. Monitor I/Os    Acute hypoxic respiratory failure: initial oxygen saturation was 88% on 2 L of oxygen. Weaned off of supplemental oxygen. Resolved  Possible CAP: continue on IV rocephin , d/c azithromycin & start doxy as per ICU. Continue on bronchodilators & encourage incentive spirometry   Hypotension: likely secondary to circulatory shock. See NP [place] on how pt met circulatory shock criteria. Weaned off of pressors today. Keep MAP >65   Hypokalemia:  WNL today    Elevated troponins: likely secondary to demand ischemia & cocaine use. No CP. Right and left heart cath in March 2025 showed normal coronaries. Cardio consulted   Cocaine abuse: urine drug screen was positive for cocaine. Received cessation counseling already. Pt stated she quit using cocaine despite her urine drug screen being positive for cocaine   Transaminitis: etiology unclear, likely secondary to cocaine abuse. Still elevated but trending down   Diarrhea: started on 01/07/24. GI PCR panel was neg    Fever: likely secondary to CAP. Blood cxs NGTD. UA showed large leukocytes but rare bacteria. No fever today so far    COPD: w/o exacerbation. Bronchodilators prn        DVT prophylaxis: lovenox  Code Status: full  Family Communication: discussed pt's care w/ pt's son at bedside and answered his questions Disposition Plan: likely d/c back to nursing home   Status is: Inpatient Remains inpatient appropriate because: severity of illness    Level of care: ICU Consultants:  ICU cardio  Procedures:    Antimicrobials:    Subjective: Pt c/o malaise  Objective: Vitals:   01/08/24 0700 01/08/24 0715 01/08/24 0730 01/08/24 0745  BP: (!) 113/92 108/85 106/83 108/88  Pulse: 80 78 74 78  Resp: 19 16 17 15   Temp:    97.7 F (36.5 C)  TempSrc:    Oral  SpO2: 100% 100% 99% 100%  Weight:      Height:        Intake/Output Summary (Last 24 hours) at 01/08/2024 0815 Last data filed at 01/08/2024 0600 Gross per 24 hour  Intake 2931.92 ml  Output 1600 ml  Net 1331.92 ml   Filed Weights   01/07/24 0500 01/07/24 2000 01/08/24 0341  Weight: 33 kg 33.8 kg 33.8 kg    Examination:  General exam: appears lethargic   Respiratory system: diminished breath sounds b/l Cardiovascular system: S1 & S2+. No rubs or gallops Gastrointestinal system: abd is soft, NT, ND & hyperactive bowel sounds Central nervous system: lethargic. Moves all extremities  Psychiatry: Judgement and insight appears poor. Flat mood and affect   Data Reviewed: I have personally reviewed following labs and imaging studies  CBC: Recent Labs  Lab 01/05/24 1438 01/07/24 0445 01/07/24 1958 01/08/24 0635  WBC 14.9* 16.8* 6.9 13.4*  NEUTROABS  --   --  4.9  --   HGB 12.5 12.1 11.0* 11.2*  HCT 37.8 35.8* 34.3* 34.2*  MCV 89.2 87.5 90.5 91.7  PLT 307 265 237 279   Basic Metabolic Panel: Recent Labs  Lab 01/05/24 1438 01/06/24 0338 01/07/24 0445 01/07/24 1958 01/08/24 0635  NA 142 142 138 135 140  K  3.5 3.2* 3.0* 4.5 4.2  CL 106 108 104 104 105  CO2 22 25 26  18* 25  GLUCOSE 105* 113* 98 292* 122*  BUN 22 24* 29* 32* 31*  CREATININE 0.91 1.14* 1.04* 1.41* 1.09*  CALCIUM  9.2 8.7* 8.0* 7.9* 8.3*  MG  --  1.7  --  2.1  --   PHOS  --   --   --  4.3  --    GFR: Estimated Creatinine Clearance: 28.6 mL/min (A) (by C-G formula based on SCr of 1.09 mg/dL (H)). Liver Function Tests: Recent Labs  Lab 01/07/24 1958 01/08/24 0635  AST 677* 289*  ALT 321* 306*  ALKPHOS 269* 261*  BILITOT 1.0 0.7  PROT 5.4* 5.9*  ALBUMIN 2.4*  2.6*   Recent Labs  Lab 01/07/24 1958  LIPASE 31   No results for input(s): "AMMONIA" in the last 168 hours. Coagulation Profile: No results for input(s): "INR", "PROTIME" in the last 168 hours. Cardiac Enzymes: No results for input(s): "CKTOTAL", "CKMB", "CKMBINDEX", "TROPONINI" in the last 168 hours. BNP (last 3 results) No results for input(s): "PROBNP" in the last 8760 hours. HbA1C: No results for input(s): "HGBA1C" in the last 72 hours. CBG: Recent Labs  Lab 01/07/24 1919 01/07/24 2023 01/08/24 0007 01/08/24 0336 01/08/24 0739  GLUCAP 205* 290* 189* 104* 107*   Lipid Profile: No results for input(s): "CHOL", "HDL", "LDLCALC", "TRIG", "CHOLHDL", "LDLDIRECT" in the last 72 hours. Thyroid  Function Tests: Recent Labs    01/07/24 2207  TSH 1.529   Anemia Panel: No results for input(s): "VITAMINB12", "FOLATE", "FERRITIN", "TIBC", "IRON", "RETICCTPCT" in the last 72 hours. Sepsis Labs: Recent Labs  Lab 01/05/24 1438 01/07/24 2207 01/08/24 0016  PROCALCITON 0.10  --   --   LATICACIDVEN  --  1.1 0.8    Recent Results (from the past 240 hours)  Culture, blood (Routine X 2) w Reflex to ID Panel     Status: None (Preliminary result)   Collection Time: 01/05/24 11:46 PM   Specimen: BLOOD  Result Value Ref Range Status   Specimen Description BLOOD RIGHT FA  Final   Special Requests   Final    BOTTLES DRAWN AEROBIC AND ANAEROBIC Blood Culture results may not be optimal due to an inadequate volume of blood received in culture bottles   Culture   Final    NO GROWTH 2 DAYS Performed at [facility], [address] [place], [place] [address]    Report Status PENDING  Incomplete  Culture, blood (Routine X 2) w Reflex to ID Panel     Status: None (Preliminary result)   Collection Time: 01/05/24 11:46 PM   Specimen: BLOOD  Result Value Ref Range Status   Specimen Description BLOOD LEFT FA  Final   Special Requests   Final    BOTTLES DRAWN AEROBIC AND  ANAEROBIC Blood Culture results may not be optimal due to an inadequate volume of blood received in culture bottles   Culture   Final    NO GROWTH 2 DAYS Performed at [facility], [address] [place], [place] [address]    Report Status PENDING  Incomplete  MRSA Next Gen by PCR, Nasal     Status: None   Collection Time: 01/07/24  8:20 PM   Specimen: Nasal Mucosa; Nasal Swab  Result Value Ref Range Status   MRSA by PCR Next Gen NOT DETECTED NOT DETECTED Final    Comment: (NOTE) The GeneXpert MRSA Assay (FDA approved for NASAL specimens  only), is one component of a comprehensive MRSA colonization surveillance program. It is not intended to diagnose MRSA infection nor to guide or monitor treatment for MRSA infections. Test performance is not FDA approved in patients less than 58 years old. Performed at [facility], [address] [place], [place] [address]          Radiology Studies: CT Angio Chest Pulmonary Embolism (PE) W or WO Contrast Result Date: 01/07/2024 CLINICAL DATA:  Positive D-dimer abdomen pain EXAM: CT ANGIOGRAPHY CHEST CT ABDOMEN AND PELVIS WITH CONTRAST TECHNIQUE: Multidetector CT imaging of the chest was performed using the standard protocol during bolus administration of intravenous contrast. Multiplanar CT image reconstructions and MIPs were obtained to evaluate the vascular anatomy. Multidetector CT imaging of the abdomen and pelvis was performed using the standard protocol during bolus administration of intravenous contrast. RADIATION DOSE REDUCTION: This exam was performed according to the departmental dose-optimization program which includes automated exposure control, adjustment of the mA and/or kV according to patient size and/or use of iterative reconstruction technique. CONTRAST:  75mL OMNIPAQUE  IOHEXOL  350 MG/ML SOLN COMPARISON:  Chest x-ray 01/07/2024, CT chest abdomen pelvis 11/14/2023 FINDINGS: CTA CHEST FINDINGS Cardiovascular:  Satisfactory opacification of the pulmonary arteries to the segmental level. No evidence of pulmonary embolism. Nonaneurysmal aorta. Cardiomegaly. No pericardial effusion Mediastinum/Nodes: Patent trachea. No suspicious thyroid  mass. No suspicious adenopathy. Esophagus within normal limits. Lungs/Pleura: Advanced emphysema. Calcified granuloma in the left lung base. No acute airspace disease, pleural effusion or pneumothorax Musculoskeletal: No acute osseous abnormality. Review of the MIP images confirms the above findings. CT ABDOMEN and PELVIS FINDINGS Hepatobiliary: No gallstone. Mild pericholecystic fluid or wall thickening. No bilary dilatation. Pancreas: Unremarkable. No pancreatic ductal dilatation or surrounding inflammatory changes. Spleen: Normal in size without focal abnormality. Adrenals/Urinary Tract: Bilateral adrenal glands are normal. Cortical scarring of the rightkidney. Nonobstructing right kidney stones. Patchy hypoenhancement of the kidneys on delayed images. Bladder is distended. Small amount of air in the bladder with catheter. Stomach/Bowel: Stomach is within normal limits. No evidence of bowel wall thickening, distention, or inflammatory changes. Vascular/Lymphatic: Aortic atherosclerosis. No enlarged abdominal or pelvic lymph nodes. Reproductive: 8.9 by 6 cm posterior pelvic mass, stable and probably uterine fibroids given lack of interval change. Other: No free air.Small free fluid in the pelvis. Musculoskeletal: No acute or significant osseous findings. Review of the MIP images confirms the above findings. IMPRESSION: 1. Negative for acute pulmonary embolus or aortic dissection. Advanced emphysema. 2. Cardiomegaly. 3. Patchy hypoenhancement of the kidneys on delayed images, question pyelonephritis. Nonobstructing right kidney stones. Cortical scarring in the right kidney. 4. Mild gallbladder wall thickening or small volume pericholecystic fluid. No calcified stones. Correlate with US  if  indicated. 5. Small free fluid in the pelvis. 6. Stable 8.9 cm posterior pelvic mass, probably uterine fibroids given lack of interval change. Aortic Atherosclerosis (ICD10-I70.0) and Emphysema (ICD10-J43.9). Electronically Signed   By: Esmeralda Hedge M.D.   On: 01/07/2024 23:37   CT ABDOMEN PELVIS W CONTRAST Result Date: 01/07/2024 CLINICAL DATA:  Positive D-dimer abdomen pain EXAM: CT ANGIOGRAPHY CHEST CT ABDOMEN AND PELVIS WITH CONTRAST TECHNIQUE: Multidetector CT imaging of the chest was performed using the standard protocol during bolus administration of intravenous contrast. Multiplanar CT image reconstructions and MIPs were obtained to evaluate the vascular anatomy. Multidetector CT imaging of the abdomen and pelvis was performed using the standard protocol during bolus administration of intravenous contrast. RADIATION DOSE REDUCTION: This exam was performed according to the departmental dose-optimization program which includes  automated exposure control, adjustment of the mA and/or kV according to patient size and/or use of iterative reconstruction technique. CONTRAST:  75mL OMNIPAQUE  IOHEXOL  350 MG/ML SOLN COMPARISON:  Chest x-ray 01/07/2024, CT chest abdomen pelvis 11/14/2023 FINDINGS: CTA CHEST FINDINGS Cardiovascular: Satisfactory opacification of the pulmonary arteries to the segmental level. No evidence of pulmonary embolism. Nonaneurysmal aorta. Cardiomegaly. No pericardial effusion Mediastinum/Nodes: Patent trachea. No suspicious thyroid  mass. No suspicious adenopathy. Esophagus within normal limits. Lungs/Pleura: Advanced emphysema. Calcified granuloma in the left lung base. No acute airspace disease, pleural effusion or pneumothorax Musculoskeletal: No acute osseous abnormality. Review of the MIP images confirms the above findings. CT ABDOMEN and PELVIS FINDINGS Hepatobiliary: No gallstone. Mild pericholecystic fluid or wall thickening. No bilary dilatation. Pancreas: Unremarkable. No pancreatic  ductal dilatation or surrounding inflammatory changes. Spleen: Normal in size without focal abnormality. Adrenals/Urinary Tract: Bilateral adrenal glands are normal. Cortical scarring of the rightkidney. Nonobstructing right kidney stones. Patchy hypoenhancement of the kidneys on delayed images. Bladder is distended. Small amount of air in the bladder with catheter. Stomach/Bowel: Stomach is within normal limits. No evidence of bowel wall thickening, distention, or inflammatory changes. Vascular/Lymphatic: Aortic atherosclerosis. No enlarged abdominal or pelvic lymph nodes. Reproductive: 8.9 by 6 cm posterior pelvic mass, stable and probably uterine fibroids given lack of interval change. Other: No free air.Small free fluid in the pelvis. Musculoskeletal: No acute or significant osseous findings. Review of the MIP images confirms the above findings. IMPRESSION: 1. Negative for acute pulmonary embolus or aortic dissection. Advanced emphysema. 2. Cardiomegaly. 3. Patchy hypoenhancement of the kidneys on delayed images, question pyelonephritis. Nonobstructing right kidney stones. Cortical scarring in the right kidney. 4. Mild gallbladder wall thickening or small volume pericholecystic fluid. No calcified stones. Correlate with US  if indicated. 5. Small free fluid in the pelvis. 6. Stable 8.9 cm posterior pelvic mass, probably uterine fibroids given lack of interval change. Aortic Atherosclerosis (ICD10-I70.0) and Emphysema (ICD10-J43.9). Electronically Signed   By: Esmeralda Hedge M.D.   On: 01/07/2024 23:37   DG Chest Port 1 View Result Date: 01/07/2024 CLINICAL DATA:  Central line EXAM: PORTABLE CHEST 1 VIEW COMPARISON:  01/07/2024, 01/05/2024 FINDINGS: Right IJ central venous catheter tip over the cavoatrial region. No visible right pneumothorax. Stable cardiomediastinal silhouette with mild airspace opacity in the left infrahilar lung. IMPRESSION: Right IJ central venous catheter tip over the cavoatrial region. No  visible pneumothorax. Mild left infrahilar atelectasis or infiltrate Electronically Signed   By: Esmeralda Hedge M.D.   On: 01/07/2024 22:49   DG Chest Port 1 View Result Date: 01/07/2024 CLINICAL DATA:  Respiratory distress EXAM: PORTABLE CHEST 1 VIEW COMPARISON:  01/05/2024 FINDINGS: Perihilar and bibasilar pulmonary infiltrates have significantly improved in the interval. Background coarsening of the pulmonary interstitium is again noted. No confluent pulmonary infiltrate. No pneumothorax or pleural effusion. Cardiac size is within normal limits. Pulmonary vascularity is normal. No acute bone abnormality. IMPRESSION: 1. Significant interval improvement in perihilar and bibasilar pulmonary infiltrates. Electronically Signed   By: Worthy Heads M.D.   On: 01/07/2024 20:25        Scheduled Meds:  atorvastatin   40 mg Oral QHS   Chlorhexidine Gluconate Cloth  6 each Topical Daily   DULoxetine  30 mg Oral Daily   enoxaparin  (LOVENOX ) injection  30 mg Subcutaneous Q24H   feeding supplement  237 mL Oral BID BM   fentaNYL        insulin  aspart  0-15 Units Subcutaneous Q4H   midodrine  10 mg Oral TID  WC   sodium bicarbonate       sodium chloride  flush  3 mL Intravenous Q12H   Continuous Infusions:  sodium chloride  250 mL (01/07/24 2111)   cefTRIAXone  (ROCEPHIN )  IV Stopped (01/08/24 0045)   doxycycline (VIBRAMYCIN) IV Stopped (01/08/24 0217)   lactated ringers      norepinephrine (LEVOPHED) Adult infusion 6 mcg/min (01/08/24 0600)     LOS: 3 days       Alphonsus Jeans, MD [facility] Pager [phone]  If 7PM-7AM, please contact night-coverag 01/08/2024, 8:15 AM

## 2024-01-08 NOTE — Consult Note (Signed)
 Cardiology Consultation   Patient ID: TACY BREDESEN MRN: [id number]; DOB: July 25, 1961  Admit date: 01/05/2024 Date of Consult: 01/08/2024  PCP:  Vicente Graham, No   [facility] Providers Cardiologist:  Antionette Kirks, MD        Patient Profile:   Penny Gill is a 63 y.o. female with a hx of cocaine and tobacco abuse, hypertension, hyperlipidemia, diabetes, COPD not on O2, depression, chronic pain, sickle cell trait, chronic HFrEF who is being seen 01/08/2024 for the evaluation of acute on chronic HFrEF and elevated high-sensitivity troponins at the request of Dr. Broadus Canes.  History of Present Illness:   [name] was previously hospitalized [phone] for acute on chronic combined systolic and diastolic heart failure.  She presented after an [facility] and was noted to have increasing shortness of breath and weight gain.  Echo revealed an LVEF of 30-25%, G1 DD, mild MR, mild LVH.  BNP was elevated at 940.  UDS positive for cocaine.  His CHS was presumed to be secondary to cocaine abuse.  Troponins were mildly elevated and peaked at 84.  EKG with lateral T wave inversions.  No ischemic evaluation was completed.  She was treated with IV Lasix .  Cardiology was not consulted during this admission.  She was discharged on aspirin  81 mg daily, atorvastatin  40 mg daily, furosemide  20 mg daily and spironolactone  12.5 mg daily with a plan to refer to [facility] cardiology for follow-up.  Unfortunately she was unable to get scheduled with [facility] cardiology.  Again hospitalized at [facility] 4/27-4/30/25 with acute respiratory failure with hypoxia and acute on chronic HFrEF with COPD exacerbation.  Patient reported increased work of breathing for 2 to 3 days prior to arrival.  She was positive for cough and wheezing as well as orthopnea and PND.  She was noted to have been admitted in March 2025 for similar issues with a new diagnosis of HFrEF EF associated with cocaine abuse.  Patient reported compliance with diuretic  regimen at home.  She states that she had smoked crack roughly 2 days ago and developed acute onset of shortness of breath.  She underwent right and left heart catheterization with normal coronaries consistent with nonischemic cardiomyopathy.  She was diuresed and was changed back to oral diuretic therapy.  She was considered stable for discharge 09/24/2023.  She was discharged on carvedilol , Farxiga , spironolactone , and losartan .  She presented to [facility] emergency room via EMS complaining of shortness of breath and was given 1 dose of Solu-Medrol , 1 albuterol  and 2 DuoNeb's in route.  She had sudden shortness of breath, leg swelling, weight gain.  She denies swelling or leg swelling 2 to 3 days prior to admission and also noticed weight gain.  She states that she was unable to see her ankles.  She stated that she unfortunately she did use crack cocaine approximately 2 days prior to symptom onset.  She states that she just could not breathe so she had to come to the hospital for further management.  She denies any chest pain, palpitations, lightheadedness or dizziness.  On 01/05/2024 she was admitted to inpatient service with acute hypoxic respiratory failure suspected An acute CHF exacerbation in setting of COPD and cocaine abuse.  On 01/07/2024 RRT was called the patient was hypotensive and minimally responsive.  She started having diarrhea that day.  Patient also complained of shortness of breath, IV fluid bolus administered patient transferred to ICU for vasopressor support.  Suspect circulatory shock related to hypovolemia from diuresis and  diarrhea.   Initial vital signs revealed blood pressure 170/112, pulse of 120, respirations of 24, temperature of 98.1.  Pertinent labs: Blood glucose 105, WBCs 14.9, BNP 1852.3, high-sensitivity troponin 235, 380, 135, 1015, 4247, at 3994  Imaging: Chest x-ray revealed increasing lung base parenchymal interstitial opacities, acute process is possible, recommending  follow-up  Medications administered in the emergency department furosemide  40 mg IVP, DuoNebs  Cardiology was consulted for elevated high-sensitivity troponin.  Past Medical History:  Diagnosis Date   COPD (chronic obstructive pulmonary disease) (HCC)    emphysema   Depression    Hyperlipidemia    Hypertension    Kidney stone    Osteoporosis    Sickle cell trait (HCC)     Past Surgical History:  Procedure Laterality Date   RIGHT/LEFT HEART CATH AND CORONARY ANGIOGRAPHY N/A 12/22/2023   Procedure: RIGHT/LEFT HEART CATH AND CORONARY ANGIOGRAPHY;  Surgeon: Sammy Crisp, MD;  Location: [facility] INVASIVE CV LAB;  Service: Cardiovascular;  Laterality: N/A;   TUBAL LIGATION       Home Medications:  Prior to Admission medications   Medication Sig Start Date End Date Taking? Authorizing Provider  acetaminophen  (TYLENOL ) 325 MG tablet Take 2 tablets (650 mg total) by mouth every 6 (six) hours as needed for mild pain (pain score 1-3), moderate pain (pain score 4-6), fever or headache (or Fever >/= 101). 12/23/23  Yes Alexander, Natalie, DO  aspirin  EC 81 MG tablet Take 1 tablet (81 mg total) by mouth daily. Swallow whole. 11/18/23  Yes Montey Apa, DO  atorvastatin  (LIPITOR) 40 MG tablet Take 1 tablet (40 mg total) by mouth at bedtime. 11/17/23  Yes Darus Engels A, DO  carvedilol  (COREG ) 3.125 MG tablet Take 1 tablet (3.125 mg total) by mouth 2 (two) times daily with a meal. 12/23/23  Yes Melodi Sprung, DO  dapagliflozin  propanediol (FARXIGA ) 10 MG TABS tablet Take 1 tablet (10 mg total) by mouth daily. 12/24/23  Yes Alexander, Natalie, DO  DULoxetine (CYMBALTA) 30 MG capsule Take 1 capsule by mouth daily. 11/02/23 11/01/24 Yes [provider]  folic acid  (FOLVITE ) 1 MG tablet Take 1 tablet (1 mg total) by mouth daily. 12/24/23  Yes Alexander, Natalie, DO  furosemide  (LASIX ) 20 MG tablet Take 1 tablet (20 mg total) by mouth daily as needed for fluid. Weight > 3 lb in a 24 hour  period. 12/23/23  Yes Alexander, Natalie, DO  gabapentin  (NEURONTIN ) 300 MG capsule Take 300 mg by mouth 4 (four) times daily.   Yes [provider]  losartan  (COZAAR ) 25 MG tablet Take 0.5 tablets (12.5 mg total) by mouth daily. 12/24/23  Yes Alexander, Natalie, DO  spironolactone  (ALDACTONE ) 25 MG tablet Take 0.5 tablets (12.5 mg total) by mouth daily. 11/18/23  Yes Darus Engels A, DO  thiamine  (VITAMIN B-1) 100 MG tablet Take 1 tablet (100 mg total) by mouth daily. 12/24/23  Yes Alexander, Natalie, DO  tiZANidine (ZANAFLEX) 2 MG tablet Take 2 mg by mouth every 8 (eight) hours as needed for muscle spasms.   Yes [provider]  feeding supplement (ENSURE ENLIVE / ENSURE PLUS) LIQD Take 237 mLs by mouth 2 (two) times daily between meals. Patient not taking: Reported on 12/28/2023 11/17/23   Darus Engels A, DO  Multiple Vitamin (MULTIVITAMIN WITH MINERALS) TABS tablet Take 1 tablet by mouth daily. Patient not taking: Reported on 12/28/2023 12/24/23   Alexander, Natalie, DO  nicotine  (NICODERM CQ  - DOSED IN MG/24 HR) 7 mg/24hr patch Place 1 patch (7  mg total) onto the skin daily. Patient not taking: Reported on 12/28/2023 12/24/23   Alexander, Natalie, DO    Inpatient Medications: Scheduled Meds:  atorvastatin   40 mg Oral QHS   Chlorhexidine Gluconate Cloth  6 each Topical Daily   DULoxetine  30 mg Oral Daily   enoxaparin  (LOVENOX ) injection  30 mg Subcutaneous Q24H   feeding supplement  237 mL Oral BID BM   insulin  aspart  0-15 Units Subcutaneous Q4H   midodrine  10 mg Oral TID WC   sodium chloride  flush  3 mL Intravenous Q12H   Continuous Infusions:  sodium chloride  250 mL (01/07/24 2111)   cefTRIAXone  (ROCEPHIN )  IV Stopped (01/08/24 0045)   doxycycline (VIBRAMYCIN) IV 125 mL/hr at 01/08/24 1330   norepinephrine (LEVOPHED) Adult infusion Stopped (01/08/24 0903)   PRN Meds: acetaminophen , ipratropium-albuterol , ondansetron  (ZOFRAN ) IV, sodium chloride  flush  Allergies:   No  Known Allergies  Social History:   Social History   Socioeconomic History   Marital status: Married    Spouse name: Not on file   Number of children: 2   Years of education: Not on file   Highest education level: Some college, no degree  Occupational History   Not on file  Tobacco Use   Smoking status: Every Day    Current packs/day: 0.20    Types: Cigarettes, E-cigarettes   Smokeless tobacco: Never   Tobacco comments:    I vape every now and then  Vaping Use   Vaping status: Never Used  Substance and Sexual Activity   Alcohol use: Not Currently    Comment: rare   Drug use: Not Currently    Types: Marijuana, Cocaine    Comment: History of cocaine abuse   Sexual activity: Not on file  Other Topics Concern   Not on file  Social History Narrative   Not on file   Social Drivers of Health   Financial Resource Strain: Medium Risk (12/22/2023)   Overall Financial Resource Strain (CARDIA)    Difficulty of Paying Living Expenses: Somewhat hard  Food Insecurity: No Food Insecurity (01/05/2024)   Hunger Vital Sign    Worried About Running Out of Food in the Last Year: Never true    Ran Out of Food in the Last Year: Never true  Transportation Needs: No Transportation Needs (01/05/2024)   PRAPARE - Administrator, Civil Service (Medical): No    Lack of Transportation (Non-Medical): No  Physical Activity: Not on File (09/10/2021)   Received from [place], [place]   Physical Activity    Physical Activity: 0  Stress: Not on File (09/10/2021)   Received from [facility], [place]   Stress    Stress: 0  Social Connections: Unknown (01/05/2024)   Social Connection and Isolation Panel [NHANES]    Frequency of Communication with Friends and Family: Not on file    Frequency of Social Gatherings with Friends and Family: Not on file    Attends Religious Services: Not on file    Active Member of Clubs or Organizations: Not on file    Attends Banker Meetings: Not on file     Marital Status: Separated  Intimate Partner Violence: Not At Risk (01/05/2024)   Humiliation, Afraid, Rape, and Kick questionnaire    Fear of Current or Ex-Partner: No    Emotionally Abused: No    Physically Abused: No    Sexually Abused: No    Family History:    Family History  Problem Relation Age of  Onset   Breast cancer Maternal Aunt 40     ROS:  Please see the history of present illness.  Review of Systems  Respiratory:  Positive for cough and shortness of breath.   Cardiovascular:  Positive for leg swelling.  Neurological:  Positive for weakness.    All other ROS reviewed and negative.     Physical Exam/Data:   Vitals:   01/08/24 1300 01/08/24 1315 01/08/24 1330 01/08/24 1400  BP: 94/70 97/84 101/70 98/68  Pulse: 79 95 73 80  Resp: 15 17 17 15   Temp:      TempSrc:      SpO2: 98% 97% 94% 94%  Weight:      Height:        Intake/Output Summary (Last 24 hours) at 01/08/2024 1425 Last data filed at 01/08/2024 1330 Gross per 24 hour  Intake 3596.65 ml  Output 1600 ml  Net 1996.65 ml      01/08/2024    3:41 AM 01/07/2024    8:00 PM 01/07/2024    5:00 AM  Last 3 Weights  Weight (lbs) 74 lb 8.3 oz 74 lb 8.3 oz 72 lb 12 oz  Weight (kg) 33.8 kg 33.8 kg 33 kg     Body mass index is 14.55 kg/m.  General:  Lying in bed in no acute distress HEENT: normal Neck: no JVD Vascular: No carotid bruits; Distal pulses 2+ bilaterally Cardiac:  normal S1, S2; RRR; no murmur  Lungs:  clear to auscultation bilaterally, no wheezing, rhonchi or rales  Abd: soft, nontender, no hepatomegaly  Ext: no edema Musculoskeletal:  No deformities, BUE and BLE strength normal and equal Skin: warm and dry  Neuro:  CNs 2-12 intact, no focal abnormalities noted Psych:  Normal affect   EKG:  The EKG was personally reviewed and demonstrates: Sinus rhythm rate of 82, inverted P waves, left atrial enlargement, LVH, questionable lateral infarct Telemetry:  Telemetry was personally reviewed and  demonstrates: Sinus rhythm with occasional unifocal PVCs and short runs of nonsustained V. tach  Relevant CV Studies: [facility] 12/22/2023 Conclusions: No angiographically significant coronary artery disease.  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 Normal left and right heart filling pressures (RA 3, RV 26/3, PCWP 8, LVEDP 10 mmHg). Upper normal pulmonary artery pressure with elevated vascular resistance (PA 27/14, mean 18 mmHg; PVR 4 WU). Mildly reduced Fick cardiac index (CO 2.5 L/min, CI 2.1 L/min/m^2).   Recommendations: Maintain net even to slightly positive fluid balance over next 24 hours. Escalate goal-directed medical therapy as tolerated; will add losartan  12.5 mg daily.    2D echo 11/15/2023 1. Left ventricular ejection fraction, by estimation, is 30 to 35%. The  left ventricle has moderate to severely decreased function. The left  ventricle demonstrates global hypokinesis. The left ventricular internal  cavity size was mildly dilated. There  is mild left ventricular hypertrophy. Left ventricular diastolic  parameters are consistent with Grade I diastolic dysfunction (impaired  relaxation).   2. Right ventricular systolic function is normal. The right ventricular  size is normal.   3. The mitral valve is normal in structure. Mild mitral valve  regurgitation.   4. The aortic valve is tricuspid. Aortic valve regurgitation is not  visualized.   5. The inferior vena cava is normal in size with greater than 50%  respiratory variability, suggesting right atrial pressure of 3 mmHg.    Laboratory Data:  High Sensitivity Troponin:   Recent Labs  Lab 01/05/24 1818 01/07/24 1958 01/07/24 2207 01/08/24 9528  01/08/24 0930  TROPONINIHS 380* 135* 1,015* 4,247* 3,994*     Chemistry Recent Labs  Lab 01/06/24 0338 01/07/24 0445 01/07/24 1958 01/08/24 0635  NA 142 138 135 140  K 3.2* 3.0* 4.5 4.2  CL 108 104 104 105  CO2 25 26 18* 25  GLUCOSE 113* 98 292*  122*  BUN 24* 29* 32* 31*  CREATININE 1.14* 1.04* 1.41* 1.09*  CALCIUM  8.7* 8.0* 7.9* 8.3*  MG 1.7  --  2.1  --   GFRNONAA 54* >60 42* 57*  ANIONGAP 9 8 13 10     Recent Labs  Lab 01/07/24 1958 01/08/24 0635  PROT 5.4* 5.9*  ALBUMIN 2.4* 2.6*  AST 677* 289*  ALT 321* 306*  ALKPHOS 269* 261*  BILITOT 1.0 0.7   Lipids No results for input(s): "CHOL", "TRIG", "HDL", "LABVLDL", "LDLCALC", "CHOLHDL" in the last 168 hours.  Hematology Recent Labs  Lab 01/07/24 0445 01/07/24 1958 01/08/24 0635  WBC 16.8* 6.9 13.4*  RBC 4.09 3.79* 3.73*  HGB 12.1 11.0* 11.2*  HCT 35.8* 34.3* 34.2*  MCV 87.5 90.5 91.7  MCH 29.6 29.0 30.0  MCHC 33.8 32.1 32.7  RDW 12.5 12.6 12.6  PLT 265 237 279   Thyroid   Recent Labs  Lab 01/07/24 2207  TSH 1.529    BNP Recent Labs  Lab 01/05/24 1438  BNP 1,852.3*    DDimer  Recent Labs  Lab 01/07/24 1958  DDIMER 1.79*     Radiology/Studies:  CT Angio Chest Pulmonary Embolism (PE) W or WO Contrast Result Date: 01/07/2024 CLINICAL DATA:  Positive D-dimer abdomen pain EXAM: CT ANGIOGRAPHY CHEST CT ABDOMEN AND PELVIS WITH CONTRAST TECHNIQUE: Multidetector CT imaging of the chest was performed using the standard protocol during bolus administration of intravenous contrast. Multiplanar CT image reconstructions and MIPs were obtained to evaluate the vascular anatomy. Multidetector CT imaging of the abdomen and pelvis was performed using the standard protocol during bolus administration of intravenous contrast. RADIATION DOSE REDUCTION: This exam was performed according to the departmental dose-optimization program which includes automated exposure control, adjustment of the mA and/or kV according to patient size and/or use of iterative reconstruction technique. CONTRAST:  75mL OMNIPAQUE  IOHEXOL  350 MG/ML SOLN COMPARISON:  Chest x-ray 01/07/2024, CT chest abdomen pelvis 11/14/2023 FINDINGS: CTA CHEST FINDINGS Cardiovascular: Satisfactory opacification of the  pulmonary arteries to the segmental level. No evidence of pulmonary embolism. Nonaneurysmal aorta. Cardiomegaly. No pericardial effusion Mediastinum/Nodes: Patent trachea. No suspicious thyroid  mass. No suspicious adenopathy. Esophagus within normal limits. Lungs/Pleura: Advanced emphysema. Calcified granuloma in the left lung base. No acute airspace disease, pleural effusion or pneumothorax Musculoskeletal: No acute osseous abnormality. Review of the MIP images confirms the above findings. CT ABDOMEN and PELVIS FINDINGS Hepatobiliary: No gallstone. Mild pericholecystic fluid or wall thickening. No bilary dilatation. Pancreas: Unremarkable. No pancreatic ductal dilatation or surrounding inflammatory changes. Spleen: Normal in size without focal abnormality. Adrenals/Urinary Tract: Bilateral adrenal glands are normal. Cortical scarring of the rightkidney. Nonobstructing right kidney stones. Patchy hypoenhancement of the kidneys on delayed images. Bladder is distended. Small amount of air in the bladder with catheter. Stomach/Bowel: Stomach is within normal limits. No evidence of bowel wall thickening, distention, or inflammatory changes. Vascular/Lymphatic: Aortic atherosclerosis. No enlarged abdominal or pelvic lymph nodes. Reproductive: 8.9 by 6 cm posterior pelvic mass, stable and probably uterine fibroids given lack of interval change. Other: No free air.Small free fluid in the pelvis. Musculoskeletal: No acute or significant osseous findings. Review of the MIP images confirms the above findings. IMPRESSION:  1. Negative for acute pulmonary embolus or aortic dissection. Advanced emphysema. 2. Cardiomegaly. 3. Patchy hypoenhancement of the kidneys on delayed images, question pyelonephritis. Nonobstructing right kidney stones. Cortical scarring in the right kidney. 4. Mild gallbladder wall thickening or small volume pericholecystic fluid. No calcified stones. Correlate with US  if indicated. 5. Small free fluid in  the pelvis. 6. Stable 8.9 cm posterior pelvic mass, probably uterine fibroids given lack of interval change. Aortic Atherosclerosis (ICD10-I70.0) and Emphysema (ICD10-J43.9). Electronically Signed   By: Esmeralda Hedge M.D.   On: 01/07/2024 23:37   CT ABDOMEN PELVIS W CONTRAST Result Date: 01/07/2024 CLINICAL DATA:  Positive D-dimer abdomen pain EXAM: CT ANGIOGRAPHY CHEST CT ABDOMEN AND PELVIS WITH CONTRAST TECHNIQUE: Multidetector CT imaging of the chest was performed using the standard protocol during bolus administration of intravenous contrast. Multiplanar CT image reconstructions and MIPs were obtained to evaluate the vascular anatomy. Multidetector CT imaging of the abdomen and pelvis was performed using the standard protocol during bolus administration of intravenous contrast. RADIATION DOSE REDUCTION: This exam was performed according to the departmental dose-optimization program which includes automated exposure control, adjustment of the mA and/or kV according to patient size and/or use of iterative reconstruction technique. CONTRAST:  75mL OMNIPAQUE  IOHEXOL  350 MG/ML SOLN COMPARISON:  Chest x-ray 01/07/2024, CT chest abdomen pelvis 11/14/2023 FINDINGS: CTA CHEST FINDINGS Cardiovascular: Satisfactory opacification of the pulmonary arteries to the segmental level. No evidence of pulmonary embolism. Nonaneurysmal aorta. Cardiomegaly. No pericardial effusion Mediastinum/Nodes: Patent trachea. No suspicious thyroid  mass. No suspicious adenopathy. Esophagus within normal limits. Lungs/Pleura: Advanced emphysema. Calcified granuloma in the left lung base. No acute airspace disease, pleural effusion or pneumothorax Musculoskeletal: No acute osseous abnormality. Review of the MIP images confirms the above findings. CT ABDOMEN and PELVIS FINDINGS Hepatobiliary: No gallstone. Mild pericholecystic fluid or wall thickening. No bilary dilatation. Pancreas: Unremarkable. No pancreatic ductal dilatation or surrounding  inflammatory changes. Spleen: Normal in size without focal abnormality. Adrenals/Urinary Tract: Bilateral adrenal glands are normal. Cortical scarring of the rightkidney. Nonobstructing right kidney stones. Patchy hypoenhancement of the kidneys on delayed images. Bladder is distended. Small amount of air in the bladder with catheter. Stomach/Bowel: Stomach is within normal limits. No evidence of bowel wall thickening, distention, or inflammatory changes. Vascular/Lymphatic: Aortic atherosclerosis. No enlarged abdominal or pelvic lymph nodes. Reproductive: 8.9 by 6 cm posterior pelvic mass, stable and probably uterine fibroids given lack of interval change. Other: No free air.Small free fluid in the pelvis. Musculoskeletal: No acute or significant osseous findings. Review of the MIP images confirms the above findings. IMPRESSION: 1. Negative for acute pulmonary embolus or aortic dissection. Advanced emphysema. 2. Cardiomegaly. 3. Patchy hypoenhancement of the kidneys on delayed images, question pyelonephritis. Nonobstructing right kidney stones. Cortical scarring in the right kidney. 4. Mild gallbladder wall thickening or small volume pericholecystic fluid. No calcified stones. Correlate with US  if indicated. 5. Small free fluid in the pelvis. 6. Stable 8.9 cm posterior pelvic mass, probably uterine fibroids given lack of interval change. Aortic Atherosclerosis (ICD10-I70.0) and Emphysema (ICD10-J43.9). Electronically Signed   By: Esmeralda Hedge M.D.   On: 01/07/2024 23:37   DG Chest Port 1 View Result Date: 01/07/2024 CLINICAL DATA:  Central line EXAM: PORTABLE CHEST 1 VIEW COMPARISON:  01/07/2024, 01/05/2024 FINDINGS: Right IJ central venous catheter tip over the cavoatrial region. No visible right pneumothorax. Stable cardiomediastinal silhouette with mild airspace opacity in the left infrahilar lung. IMPRESSION: Right IJ central venous catheter tip over the cavoatrial region. No visible pneumothorax. Mild left  infrahilar atelectasis or infiltrate Electronically Signed   By: Esmeralda Hedge M.D.   On: 01/07/2024 22:49   DG Chest Port 1 View Result Date: 01/07/2024 CLINICAL DATA:  Respiratory distress EXAM: PORTABLE CHEST 1 VIEW COMPARISON:  01/05/2024 FINDINGS: Perihilar and bibasilar pulmonary infiltrates have significantly improved in the interval. Background coarsening of the pulmonary interstitium is again noted. No confluent pulmonary infiltrate. No pneumothorax or pleural effusion. Cardiac size is within normal limits. Pulmonary vascularity is normal. No acute bone abnormality. IMPRESSION: 1. Significant interval improvement in perihilar and bibasilar pulmonary infiltrates. Electronically Signed   By: Worthy Heads M.D.   On: 01/07/2024 20:25   DG Chest 2 View Result Date: 01/05/2024 CLINICAL DATA:  Shortness of breath EXAM: CHEST - 2 VIEW COMPARISON:  X-ray 12/20/2023 FINDINGS: Stable cardiopericardial silhouette. No pneumothorax or effusion. Prominent central vasculature. Increasing lung base parenchymal interstitial opacities. Acute infiltrate is possible. Overlapping cardiac leads. Degenerative changes of the spine. IMPRESSION: Increasing lung base parenchymal interstitial opacities. Acute process is possible. Recommend follow-up. Electronically Signed   By: Adrianna Horde M.D.   On: 01/05/2024 14:25     Assessment and Plan:   Elevated high-sensitivity troponin -High-sensitivity troponins peaked 4247 - Currently remains chest pain-free -Recent right and left heart catheterization showed angiographically normal coronaries - Likely related to heart failure, hypertension, circulatory shock, and recent cocaine use - Continued on statin therapy -No plans for further ischemic workup - EKG as needed for pain or changes  Acute on chronic HFrEF -Recently hospitalized last in 10/2023 and again in 11/2023 with heart failure exacerbation presumed NICM secondary to cocaine abuse was, underwent IV  diuresis -Right and left heart catheterization completed in admission in 11/2023 with no angiographically significant coronary artery disease - Echocardiogram revealed an LVEF of 30-35% -Patient presented with sudden onset shortness of breath, weight gain, and peripheral edema - BNP 1852.3 -GDMT placed on hold due to hypotension requiring vasopressors -Losartan , spironolactone , carvedilol  and diuresis placed on hold due to shock -Daily weights and I's and O's  Acute respiratory failure with hypoxia -Improved -Maintaining oxygen saturation on room air -Continued on bronchodilators as needed -Ongoing management per PCCM  Circulatory shock and hypotension -Previously required vasopressor support -Levophed was stopped at 9 AM today -Blood pressure 90/65 -Continued on midodrine 10 mg 3 times daily - IV antibiotics -Continue to trend labs monitor WBCs with daily CBC - Ongoing management per PCCM  Mixed hyperlipidemia -Continued on atorvastatin  40 mg daily  Type 2 diabetes -Hemoglobin A1c 5.8 -Continued on insulin  -Farxiga  on hold due to AKI metabolic acidosis -Ongoing management per PCCM  COPD - Not in acute exacerbation - Continue as needed bronchodilators - Titrate FiO2 to maintain oxygen saturations greater than equal to 92% currently able to be weaned to room air  Cocaine abuse/tobacco abuse -Continue to recommend total cessation support   Risk Assessment/Risk Scores:        [place]  Heart Association (NYHA) Functional Class NYHA Class III        For questions or updates, please contact [facility] Please consult [email or web address] for contact info under    Signed, Koleton Duchemin, NP  01/08/2024 2:25 PM

## 2024-01-08 NOTE — Plan of Care (Signed)

## 2024-01-08 NOTE — Progress Notes (Signed)
 Patient arrived with a bag of clothes, a cell phone, and briefs. Placed in room.

## 2024-01-08 NOTE — Progress Notes (Signed)
 Initial Nutrition Assessment  DOCUMENTATION CODES:   Severe malnutrition in context of chronic illness  INTERVENTION:   Ensure Enlive po TID, each supplement provides 350 kcal and 20 grams of protein.  MVI, folic acid  and thiamine  po daily   Liberalize diet   Pt at high refeed risk; recommend monitor potassium, magnesium  and phosphorus labs daily until stable  Daily weights   NUTRITION DIAGNOSIS:   Severe Malnutrition related to chronic illness as evidenced by severe fat depletion, severe muscle depletion, 10 percent weight loss in 2 months.  GOAL:   Patient will meet greater than or equal to 90% of their needs  MONITOR:   PO intake, Supplement acceptance, Labs, Weight trends, I & O's, Skin  REASON FOR ASSESSMENT:   Consult Assessment of nutrition requirement/status  ASSESSMENT:   63 y.o. female with a h/o cocaine and tobacco abuse, neuropathy, hypertension, hyperlipidemia, diabetes, COPD not on O2, depression, anxiety, chronic pain, kidney stones, sickle cell trait and recent admission for CHF who is admitted with diarrhea, NSTEMI, shock and AKI.  Met with pt in room today. Pt is well known to this RD from a recent previous admission. Pt with decreased appetite and oral intake at baseline. Pt reports that she has lost down from 100lbs over the past 3-4 years secondary to her drug use. Per chart, pt is down ~8lbs(10%) over the past 2 months; this is significant weight loss. Pt is down 4lbs(5%) from her last admission; RD unsure if this is r/t fluid changes. Pt drinks vanilla Ensure at home. Pt ate 100% of a small breakfast this morning and drank an Ensure. Recommend continue Ensure. Pt is at high refeed risk.   Medications reviewed and include: lovenox , insulin , midodrine, ceftriaxone , doxycycline  Labs reviewed: K 4.2 wnl, BUN 31(H), creat 1.09(H) P 4.3 wnl, Mg 2.1 wnl- 5/15 B12- 452- 4/29 BNP- 1852.3(H)- 5/13 Wbc- 13.4(H) Cbgs- 126, 107, 104, 189 x 24 hrs  AIC  5.8(H)- 3/23  NUTRITION - FOCUSED PHYSICAL EXAM:  Flowsheet Row Most Recent Value  Orbital Region Severe depletion  Upper Arm Region Severe depletion  Thoracic and Lumbar Region Severe depletion  Buccal Region Severe depletion  Temple Region Severe depletion  Clavicle Bone Region Severe depletion  Clavicle and Acromion Bone Region Severe depletion  Scapular Bone Region Severe depletion  Dorsal Hand Severe depletion  Patellar Region Severe depletion  Anterior Thigh Region Severe depletion  Posterior Calf Region Severe depletion  Edema (RD Assessment) Mild  Hair Reviewed  Eyes Reviewed  Mouth Reviewed  Skin Reviewed  Nails Reviewed   Diet Order:   Diet Order             Diet Heart Fluid consistency: Thin  Diet effective now                  EDUCATION NEEDS:   Education needs have been addressed  Skin:  Skin Assessment: Reviewed RN Assessment  Last BM:  5/16- TYPE 6  Height:   Ht Readings from Last 1 Encounters:  01/07/24 5' (1.524 m)    Weight:   Wt Readings from Last 1 Encounters:  01/08/24 33.8 kg    Ideal Body Weight:  45.4 kg  BMI:  Body mass index is 14.55 kg/m.  Estimated Nutritional Needs:   Kcal:  1200-1400kal/day  Protein:  60-70g/day  Fluid:  1.0-1.2L/day  Torrance Freestone MS, RD, LDN If unable to be reached, please send secure chat to "RD inpatient" available from 8:00a-4:00p daily

## 2024-01-08 NOTE — TOC Progression Note (Signed)
 Transition of Care [facility]) - Progression Note    Patient Details  Name: Penny Gill MRN: [id number] Date of Birth: 1960/11/11  [facility] CM/SW Contact  Baird Bombard, RN Phone Number: 01/08/2024, 12:31 PM  Clinical Narrative:    Attempt to speak with patient at bedside regarding her discharge plan. Patient was sleepy and requested a visit at another time.          Expected Discharge Plan and Services                                               Social Determinants of Health (SDOH) Interventions SDOH Screenings   Food Insecurity: No Food Insecurity (01/05/2024)  Housing: Low Risk  (01/05/2024)  Transportation Needs: No Transportation Needs (01/05/2024)  Utilities: Not At Risk (01/05/2024)  Depression (PHQ2-9): Low Risk  (01/11/2020)  Financial Resource Strain: Medium Risk (12/22/2023)  Physical Activity: Not on File (09/10/2021)   Received from [place], [place]  Social Connections: Unknown (01/05/2024)  Stress: Not on File (09/10/2021)   Received from [facility], [place]  Tobacco Use: High Risk (01/05/2024)    Readmission Risk Interventions     No data to display

## 2024-01-08 NOTE — Plan of Care (Signed)
 Vital signs reviewed, ICU needs resolved  Will sign off at this time. No further recommendations at this time.  Off pressors, NSTEMI cardiology to be consulted  Lady Pier, M.D.  [facility]  Medical Director [facility] Medical Director [facility] Cardio-Pulmonary Department

## 2024-01-08 NOTE — Progress Notes (Signed)
 PHARMACY CONSULT NOTE - FOLLOW UP  Pharmacy Consult for Electrolyte Monitoring and Replacement   Recent Labs: Potassium (mmol/L)  Date Value  01/08/2024 4.2  07/05/2014 3.8   Magnesium  (mg/dL)  Date Value  27/25/3664 2.1   Calcium  (mg/dL)  Date Value  40/34/7425 8.3 (L)   Calcium , Total (mg/dL)  Date Value  95/63/8756 8.6   Albumin (g/dL)  Date Value  43/32/9518 2.6 (L)  07/05/2014 3.7   Phosphorus (mg/dL)  Date Value  84/16/6063 4.3   Sodium (mmol/L)  Date Value  01/08/2024 140  07/05/2014 141     Assessment: 63 y.o. female with cocaine and tobacco abuse, HTN, HLD, DM, COPD not on O2, depression, chronic pain, sickle cell trait who presented with shortness of breath worsening over several days. Pharmacy is asked to follow and replace electrolytes while in CCU  Goal of Therapy:  Electrolytes WNL  Plan:  ---no electrolyte replacement warranted for today ---recheck electrolytes in am  Adalberto Acton ,PharmD Clinical Pharmacist 01/08/2024 11:26 AM

## 2024-01-08 NOTE — Plan of Care (Signed)
  Problem: Education: Goal: Knowledge of General Education information will improve Description: Including pain rating scale, medication(s)/side effects and non-pharmacologic comfort measures Outcome: Progressing   Problem: Health Behavior/Discharge Planning: Goal: Ability to manage health-related needs will improve Outcome: Progressing   Problem: Clinical Measurements: Goal: Ability to maintain clinical measurements within normal limits will improve Outcome: Progressing Goal: Will remain free from infection Outcome: Progressing Goal: Diagnostic test results will improve Outcome: Progressing Goal: Respiratory complications will improve Outcome: Progressing Goal: Cardiovascular complication will be avoided Outcome: Progressing   Problem: Nutrition: Goal: Adequate nutrition will be maintained Outcome: Progressing   Problem: Activity: Goal: Risk for activity intolerance will decrease Outcome: Progressing   Problem: Coping: Goal: Level of anxiety will decrease Outcome: Progressing   Problem: Elimination: Goal: Will not experience complications related to bowel motility Outcome: Progressing   Problem: Pain Managment: Goal: General experience of comfort will improve and/or be controlled Outcome: Progressing

## 2024-01-09 ENCOUNTER — Inpatient Hospital Stay
Admit: 2024-01-09 | Discharge: 2024-01-09 | Disposition: A | Discharge status: Home / Self Care | Payer: MEDICAID | Attending: Acute Care

## 2024-01-09 DIAGNOSIS — R011 Cardiac murmur, unspecified: Secondary | ICD-10-CM

## 2024-01-09 DIAGNOSIS — I5043 Acute on chronic combined systolic (congestive) and diastolic (congestive) heart failure: Secondary | ICD-10-CM

## 2024-01-09 LAB — ECHOCARDIOGRAM COMPLETE BUBBLE STUDY
AR max vel: 1.88 cm2
AV Peak grad: 4.4 mmHg
Ao pk vel: 1.05 m/s
Area-P 1/2: 4.6 cm2
Calc EF: 32.4 %
MV M vel: 5.53 m/s
MV Peak grad: 122.3 mmHg
MV VTI: 0.84 cm2
Radius: 0.65 cm
S' Lateral: 5.2 cm
Single Plane A2C EF: 32.5 %
Single Plane A4C EF: 33.8 %

## 2024-01-09 LAB — CBC
HCT: 33.6 % — ABNORMAL LOW (ref 36.0–46.0)
Hemoglobin: 11 g/dL — ABNORMAL LOW (ref 12.0–15.0)
MCH: 29.7 pg (ref 26.0–34.0)
MCHC: 32.7 g/dL (ref 30.0–36.0)
MCV: 90.8 fL (ref 80.0–100.0)
Platelets: 253 10*3/uL (ref 150–400)
RBC: 3.7 MIL/uL — ABNORMAL LOW (ref 3.87–5.11)
RDW: 12.4 % (ref 11.5–15.5)
WBC: 7.7 10*3/uL (ref 4.0–10.5)
nRBC: 0 % (ref 0.0–0.2)

## 2024-01-09 LAB — PHOSPHORUS: Phosphorus: 2 mg/dL — ABNORMAL LOW (ref 2.5–4.6)

## 2024-01-09 LAB — BASIC METABOLIC PANEL WITH GFR
Anion gap: 7 (ref 5–15)
BUN: 19 mg/dL (ref 8–23)
CO2: 24 mmol/L (ref 22–32)
Calcium: 8.4 mg/dL — ABNORMAL LOW (ref 8.9–10.3)
Chloride: 108 mmol/L (ref 98–111)
Creatinine, Ser: 0.81 mg/dL (ref 0.44–1.00)
GFR, Estimated: >60 mL/min (ref >60)
Glucose, Bld: 132 mg/dL — ABNORMAL HIGH (ref 70–99)
Potassium: 3.8 mmol/L (ref 3.5–5.1)
Sodium: 139 mmol/L (ref 135–145)

## 2024-01-09 LAB — MAGNESIUM: Magnesium: 1.6 mg/dL — ABNORMAL LOW (ref 1.7–2.4)

## 2024-01-09 LAB — GLUCOSE, CAPILLARY
Glucose-Capillary: 109 mg/dL — ABNORMAL HIGH (ref 70–99)
Glucose-Capillary: 128 mg/dL — ABNORMAL HIGH (ref 70–99)
Glucose-Capillary: 156 mg/dL — ABNORMAL HIGH (ref 70–99)
Glucose-Capillary: 183 mg/dL — ABNORMAL HIGH (ref 70–99)
Glucose-Capillary: 87 mg/dL (ref 70–99)
Glucose-Capillary: 89 mg/dL (ref 70–99)

## 2024-01-09 MED ORDER — DIGOXIN 125 MCG PO TABS
0.1250 mg | ORAL_TABLET | Freq: Every day | ORAL | Status: DC
  Administered 2024-01-09 – 2024-01-11 (×3): 0.125 mg via ORAL
  Filled 2024-01-09 (×4): qty 1

## 2024-01-09 MED ORDER — MAGNESIUM SULFATE 4 GM/100ML IV SOLN
4.0000 g | Freq: Once | INTRAVENOUS | Status: AC
  Administered 2024-01-09: 4 g via INTRAVENOUS
  Filled 2024-01-09: qty 100

## 2024-01-09 MED ORDER — ASPIRIN 81 MG PO TBEC
81.0000 mg | DELAYED_RELEASE_TABLET | Freq: Every day | ORAL | Status: DC
  Administered 2024-01-09 – 2024-01-12 (×4): 81 mg via ORAL
  Filled 2024-01-09 (×4): qty 1

## 2024-01-09 MED ORDER — K PHOS MONO-SOD PHOS DI & MONO 155-852-130 MG PO TABS
500.0000 mg | ORAL_TABLET | Freq: Two times a day (BID) | ORAL | Status: AC
  Administered 2024-01-09 (×2): 500 mg via ORAL
  Filled 2024-01-09 (×2): qty 2

## 2024-01-09 MED ORDER — LOSARTAN POTASSIUM 25 MG PO TABS
12.5000 mg | ORAL_TABLET | Freq: Every day | ORAL | Status: DC
  Filled 2024-01-09: qty 0.5

## 2024-01-09 NOTE — Progress Notes (Addendum)
 PROGRESS NOTE    Penny Gill  WUJ:[id number] DOB: [birth year] DOA: 01/05/2024 PCP: Pcp, No   Assessment & Plan:   Principal Problem:   Acute on chronic HFrEF (heart failure with reduced ejection fraction) (HCC) Active Problems:   Shock (HCC)  Assessment and Plan:  Acute on chronic combined CHF: holding lasix , coreg , losarta, farxiga  as per cardio. D/c midodrine  as per cardio. Monitor I/Os   Acute hypoxic respiratory failure: initial oxygen saturation was 88% on 2 L of oxygen. Weaned off of supplemental oxygen. Resolved  Possible CAP: continue on IV rocephin , doxy, bronchodilators & encourage incentive spirometry   Hypomagnesemia: mg sulfate given  Hypophosphatemia: phos repleated   Hypotension: likely secondary to circulatory shock. See NP Rust[place]Chester on how pt met circulatory shock criteria. Weaned off of pressors. Keep MAP >65. Resolved  Hypokalemia:  WNL today     Elevated troponins: likely secondary to demand ischemia & cocaine use. No chest pain currently. Right and left heart cath in March 2025 showed normal coronaries. Cardio following and recs apprec   Cocaine abuse: urine drug screen was positive for cocaine. Received cessation counseling already.  Transaminitis: etiology unclear, likely secondary to cocaine abuse. Will continue to monitor   Diarrhea: started on 01/07/24. GI PCR panel was neg. Resolved    Fever: likely secondary to CAP. Blood cxs NGTD. UA showed large leukocytes but rare bacteria. No fever > 48 hrs  COPD: w/o exacerbation.  Bronchodilators prn        DVT prophylaxis: lovenox  Code Status: full  Family Communication:  Disposition Plan: likely d/c back to nursing home   Status is: Inpatient Remains inpatient appropriate because: severity of illness    Level of care: Progressive Consultants:  ICU cardio  Procedures:    Antimicrobials:   Subjective: Pt c/o fatigue   Objective: Vitals:   01/09/24 0530 01/09/24 0600 01/09/24  0630 01/09/24 0800  BP: 121/79 115/74 118/82 128/82  Pulse: (!) 50 92 93 (!) 108  Resp: 18   (!) 24  Temp:    98.4 F (36.9 C)  TempSrc:    Oral  SpO2: 95% 96% 96% 100%  Weight:      Height:        Intake/Output Summary (Last 24 hours) at 01/09/2024 0916 Last data filed at 01/09/2024 0831 Gross per 24 hour  Intake 1105.05 ml  Output 1950 ml  Net -844.95 ml   Filed Weights   01/07/24 2000 01/08/24 0341 01/09/24 0350  Weight: 33.8 kg 33.8 kg 33.8 kg    Examination:  General exam: appears calm & comfortable  Respiratory system: decreased breath sounds b/l  Cardiovascular system: S1 & S2+. No rubs or clicks  Gastrointestinal system: abd is soft, NT, ND & hypoactive bowel sounds  Central nervous system: alert & awake.  Moves all extremities  Psychiatry: Judgement and insight appears improved. Flat mood and affect   Data Reviewed: I have personally reviewed following labs and imaging studies  CBC: Recent Labs  Lab 01/05/24 1438 01/07/24 0445 01/07/24 1958 01/08/24 0635 01/09/24 0431  WBC 14.9* 16.8* 6.9 13.4* 7.7  NEUTROABS  --   --  4.9  --   --   HGB 12.5 12.1 11.0* 11.2* 11.0*  HCT 37.8 35.8* 34.3* 34.2* 33.6*  MCV 89.2 87.5 90.5 91.7 90.8  PLT 307 265 237 279 253   Basic Metabolic Panel: Recent Labs  Lab 01/06/24 0338 01/07/24 0445 01/07/24 1958 01/08/24 0635 01/09/24 0431  NA 142 138 135 140 139  K 3.2* 3.0* 4.5 4.2 3.8  CL 108 104 104 105 108  CO2 25 26 18* 25 24  GLUCOSE 113* 98 292* 122* 132*  BUN 24* 29* 32* 31* 19  CREATININE 1.14* 1.04* 1.41* 1.09* 0.81  CALCIUM  8.7* 8.0* 7.9* 8.3* 8.4*  MG 1.7  --  2.1  --  1.6*  PHOS  --   --  4.3  --  2.0*   GFR: Estimated Creatinine Clearance: 38.4 mL/min (by C-G formula based on SCr of 0.81 mg/dL). Liver Function Tests: Recent Labs  Lab 01/07/24 1958 01/08/24 0635  AST 677* 289*  ALT 321* 306*  ALKPHOS 269* 261*  BILITOT 1.0 0.7  PROT 5.4* 5.9*  ALBUMIN 2.4* 2.6*   Recent Labs  Lab  01/07/24 1958  LIPASE 31   No results for input(s): "AMMONIA" in the last 168 hours. Coagulation Profile: No results for input(s): "INR", "PROTIME" in the last 168 hours. Cardiac Enzymes: No results for input(s): "CKTOTAL", "CKMB", "CKMBINDEX", "TROPONINI" in the last 168 hours. BNP (last 3 results) No results for input(s): "PROBNP" in the last 8760 hours. HbA1C: No results for input(s): "HGBA1C" in the last 72 hours. CBG: Recent Labs  Lab 01/08/24 1548 01/08/24 1933 01/08/24 2340 01/09/24 0429 01/09/24 0744  GLUCAP 157* 114* 96 128* 89   Lipid Profile: No results for input(s): "CHOL", "HDL", "LDLCALC", "TRIG", "CHOLHDL", "LDLDIRECT" in the last 72 hours. Thyroid  Function Tests: Recent Labs    01/07/24 2207  TSH 1.529   Anemia Panel: No results for input(s): "VITAMINB12", "FOLATE", "FERRITIN", "TIBC", "IRON", "RETICCTPCT" in the last 72 hours. Sepsis Labs: Recent Labs  Lab 01/05/24 1438 01/07/24 2207 01/08/24 0016  PROCALCITON 0.10  --   --   LATICACIDVEN  --  1.1 0.8    Recent Results (from the past 240 hours)  Culture, blood (Routine X 2) w Reflex to ID Panel     Status: None (Preliminary result)   Collection Time: 01/05/24 11:46 PM   Specimen: BLOOD  Result Value Ref Range Status   Specimen Description BLOOD RIGHT FA  Final   Special Requests   Final    BOTTLES DRAWN AEROBIC AND ANAEROBIC Blood Culture results may not be optimal due to an inadequate volume of blood received in culture bottles   Culture   Final    NO GROWTH 3 DAYS Performed at [facility], [address] [place], [place] [address]    Report Status PENDING  Incomplete  Culture, blood (Routine X 2) w Reflex to ID Panel     Status: None (Preliminary result)   Collection Time: 01/05/24 11:46 PM   Specimen: BLOOD  Result Value Ref Range Status   Specimen Description BLOOD LEFT FA  Final   Special Requests   Final    BOTTLES DRAWN AEROBIC AND ANAEROBIC Blood Culture results may  not be optimal due to an inadequate volume of blood received in culture bottles   Culture   Final    NO GROWTH 3 DAYS Performed at [facility], [address] [place], [place] [address]    Report Status PENDING  Incomplete  MRSA Next Gen by PCR, Nasal     Status: None   Collection Time: 01/07/24  8:20 PM   Specimen: Nasal Mucosa; Nasal Swab  Result Value Ref Range Status   MRSA by PCR Next Gen NOT DETECTED NOT DETECTED Final    Comment: (NOTE) The GeneXpert MRSA Assay (FDA approved for NASAL specimens only), is one component of  a comprehensive MRSA colonization surveillance program. It is not intended to diagnose MRSA infection nor to guide or monitor treatment for MRSA infections. Test performance is not FDA approved in patients less than 55 years old. Performed at [facility], [address] [place], [place] [address]   Gastrointestinal Panel by PCR , Stool     Status: None   Collection Time: 01/08/24 11:12 AM   Specimen: Stool  Result Value Ref Range Status   Campylobacter species NOT DETECTED NOT DETECTED Final   Plesimonas shigelloides NOT DETECTED NOT DETECTED Final   Salmonella species NOT DETECTED NOT DETECTED Final   Yersinia enterocolitica NOT DETECTED NOT DETECTED Final   Vibrio species NOT DETECTED NOT DETECTED Final   Vibrio cholerae NOT DETECTED NOT DETECTED Final   Enteroaggregative E coli (EAEC) NOT DETECTED NOT DETECTED Final   Enteropathogenic E coli (EPEC) NOT DETECTED NOT DETECTED Final   Enterotoxigenic E coli (ETEC) NOT DETECTED NOT DETECTED Final   Shiga like toxin producing E coli (STEC) NOT DETECTED NOT DETECTED Final   Shigella/Enteroinvasive E coli (EIEC) NOT DETECTED NOT DETECTED Final   Cryptosporidium NOT DETECTED NOT DETECTED Final   Cyclospora cayetanensis NOT DETECTED NOT DETECTED Final   Entamoeba histolytica NOT DETECTED NOT DETECTED Final   Giardia lamblia NOT DETECTED NOT DETECTED Final   Adenovirus F40/41 NOT  DETECTED NOT DETECTED Final   Astrovirus NOT DETECTED NOT DETECTED Final   Norovirus GI/GII NOT DETECTED NOT DETECTED Final   Rotavirus A NOT DETECTED NOT DETECTED Final   Sapovirus (I, II, IV, and V) NOT DETECTED NOT DETECTED Final    Comment: Performed at [facility], [address] [place], [place] [address]         Radiology Studies: CT Angio Chest Pulmonary Embolism (PE) W or WO Contrast Result Date: 01/07/2024 CLINICAL DATA:  Positive D-dimer abdomen pain EXAM: CT ANGIOGRAPHY CHEST CT ABDOMEN AND PELVIS WITH CONTRAST TECHNIQUE: Multidetector CT imaging of the chest was performed using the standard protocol during bolus administration of intravenous contrast. Multiplanar CT image reconstructions and MIPs were obtained to evaluate the vascular anatomy. Multidetector CT imaging of the abdomen and pelvis was performed using the standard protocol during bolus administration of intravenous contrast. RADIATION DOSE REDUCTION: This exam was performed according to the departmental dose-optimization program which includes automated exposure control, adjustment of the mA and/or kV according to patient size and/or use of iterative reconstruction technique. CONTRAST:  75mL OMNIPAQUE  IOHEXOL  350 MG/ML SOLN COMPARISON:  Chest x-ray 01/07/2024, CT chest abdomen pelvis 11/14/2023 FINDINGS: CTA CHEST FINDINGS Cardiovascular: Satisfactory opacification of the pulmonary arteries to the segmental level. No evidence of pulmonary embolism. Nonaneurysmal aorta. Cardiomegaly. No pericardial effusion Mediastinum/Nodes: Patent trachea. No suspicious thyroid  mass. No suspicious adenopathy. Esophagus within normal limits. Lungs/Pleura: Advanced emphysema. Calcified granuloma in the left lung base. No acute airspace disease, pleural effusion or pneumothorax Musculoskeletal: No acute osseous abnormality. Review of the MIP images confirms the above findings. CT ABDOMEN and PELVIS FINDINGS Hepatobiliary: No  gallstone. Mild pericholecystic fluid or wall thickening. No bilary dilatation. Pancreas: Unremarkable. No pancreatic ductal dilatation or surrounding inflammatory changes. Spleen: Normal in size without focal abnormality. Adrenals/Urinary Tract: Bilateral adrenal glands are normal. Cortical scarring of the rightkidney. Nonobstructing right Gill stones. Patchy hypoenhancement of the kidneys on delayed images. Bladder is distended. Small amount of air in the bladder with catheter. Stomach/Bowel: Stomach is within normal limits. No evidence of bowel wall thickening, distention, or inflammatory changes. Vascular/Lymphatic: Aortic atherosclerosis. No enlarged abdominal  or pelvic lymph nodes. Reproductive: 8.9 by 6 cm posterior pelvic mass, stable and probably uterine fibroids given lack of interval change. Other: No free air.Small free fluid in the pelvis. Musculoskeletal: No acute or significant osseous findings. Review of the MIP images confirms the above findings. IMPRESSION: 1. Negative for acute pulmonary embolus or aortic dissection. Advanced emphysema. 2. Cardiomegaly. 3. Patchy hypoenhancement of the kidneys on delayed images, question pyelonephritis. Nonobstructing right Gill stones. Cortical scarring in the right Gill. 4. Mild gallbladder wall thickening or small volume pericholecystic fluid. No calcified stones. Correlate with US  if indicated. 5. Small free fluid in the pelvis. 6. Stable 8.9 cm posterior pelvic mass, probably uterine fibroids given lack of interval change. Aortic Atherosclerosis (ICD10-I70.0) and Emphysema (ICD10-J43.9). Electronically Signed   By: Esmeralda Hedge M.D.   On: 01/07/2024 23:37   CT ABDOMEN PELVIS W CONTRAST Result Date: 01/07/2024 CLINICAL DATA:  Positive D-dimer abdomen pain EXAM: CT ANGIOGRAPHY CHEST CT ABDOMEN AND PELVIS WITH CONTRAST TECHNIQUE: Multidetector CT imaging of the chest was performed using the standard protocol during bolus administration of intravenous  contrast. Multiplanar CT image reconstructions and MIPs were obtained to evaluate the vascular anatomy. Multidetector CT imaging of the abdomen and pelvis was performed using the standard protocol during bolus administration of intravenous contrast. RADIATION DOSE REDUCTION: This exam was performed according to the departmental dose-optimization program which includes automated exposure control, adjustment of the mA and/or kV according to patient size and/or use of iterative reconstruction technique. CONTRAST:  75mL OMNIPAQUE  IOHEXOL  350 MG/ML SOLN COMPARISON:  Chest x-ray 01/07/2024, CT chest abdomen pelvis 11/14/2023 FINDINGS: CTA CHEST FINDINGS Cardiovascular: Satisfactory opacification of the pulmonary arteries to the segmental level. No evidence of pulmonary embolism. Nonaneurysmal aorta. Cardiomegaly. No pericardial effusion Mediastinum/Nodes: Patent trachea. No suspicious thyroid  mass. No suspicious adenopathy. Esophagus within normal limits. Lungs/Pleura: Advanced emphysema. Calcified granuloma in the left lung base. No acute airspace disease, pleural effusion or pneumothorax Musculoskeletal: No acute osseous abnormality. Review of the MIP images confirms the above findings. CT ABDOMEN and PELVIS FINDINGS Hepatobiliary: No gallstone. Mild pericholecystic fluid or wall thickening. No bilary dilatation. Pancreas: Unremarkable. No pancreatic ductal dilatation or surrounding inflammatory changes. Spleen: Normal in size without focal abnormality. Adrenals/Urinary Tract: Bilateral adrenal glands are normal. Cortical scarring of the rightkidney. Nonobstructing right Gill stones. Patchy hypoenhancement of the kidneys on delayed images. Bladder is distended. Small amount of air in the bladder with catheter. Stomach/Bowel: Stomach is within normal limits. No evidence of bowel wall thickening, distention, or inflammatory changes. Vascular/Lymphatic: Aortic atherosclerosis. No enlarged abdominal or pelvic lymph nodes.  Reproductive: 8.9 by 6 cm posterior pelvic mass, stable and probably uterine fibroids given lack of interval change. Other: No free air.Small free fluid in the pelvis. Musculoskeletal: No acute or significant osseous findings. Review of the MIP images confirms the above findings. IMPRESSION: 1. Negative for acute pulmonary embolus or aortic dissection. Advanced emphysema. 2. Cardiomegaly. 3. Patchy hypoenhancement of the kidneys on delayed images, question pyelonephritis. Nonobstructing right Gill stones. Cortical scarring in the right Gill. 4. Mild gallbladder wall thickening or small volume pericholecystic fluid. No calcified stones. Correlate with US  if indicated. 5. Small free fluid in the pelvis. 6. Stable 8.9 cm posterior pelvic mass, probably uterine fibroids given lack of interval change. Aortic Atherosclerosis (ICD10-I70.0) and Emphysema (ICD10-J43.9). Electronically Signed   By: Esmeralda Hedge M.D.   On: 01/07/2024 23:37   DG Chest Port 1 View Result Date: 01/07/2024 CLINICAL DATA:  Central line EXAM: PORTABLE CHEST 1  VIEW COMPARISON:  01/07/2024, 01/05/2024 FINDINGS: Right IJ central venous catheter tip over the cavoatrial region. No visible right pneumothorax. Stable cardiomediastinal silhouette with mild airspace opacity in the left infrahilar lung. IMPRESSION: Right IJ central venous catheter tip over the cavoatrial region. No visible pneumothorax. Mild left infrahilar atelectasis or infiltrate Electronically Signed   By: Esmeralda Hedge M.D.   On: 01/07/2024 22:49   DG Chest Port 1 View Result Date: 01/07/2024 CLINICAL DATA:  Respiratory distress EXAM: PORTABLE CHEST 1 VIEW COMPARISON:  01/05/2024 FINDINGS: Perihilar and bibasilar pulmonary infiltrates have significantly improved in the interval. Background coarsening of the pulmonary interstitium is again noted. No confluent pulmonary infiltrate. No pneumothorax or pleural effusion. Cardiac size is within normal limits. Pulmonary vascularity  is normal. No acute bone abnormality. IMPRESSION: 1. Significant interval improvement in perihilar and bibasilar pulmonary infiltrates. Electronically Signed   By: Worthy Heads M.D.   On: 01/07/2024 20:25        Scheduled Meds:  atorvastatin   40 mg Oral QHS   Chlorhexidine  Gluconate Cloth  6 each Topical Daily   DULoxetine   30 mg Oral Daily   enoxaparin  (LOVENOX ) injection  30 mg Subcutaneous Q24H   feeding supplement  237 mL Oral TID BM   folic acid   1 mg Oral Daily   insulin  aspart  0-15 Units Subcutaneous Q4H   midodrine   10 mg Oral TID WC   multivitamin with minerals  1 tablet Oral Daily   phosphorus  500 mg Oral BID   sodium chloride  flush  3 mL Intravenous Q12H   thiamine   100 mg Oral Daily   Continuous Infusions:  cefTRIAXone  (ROCEPHIN )  IV Stopped (01/09/24 0012)   doxycycline  (VIBRAMYCIN ) IV Stopped (01/09/24 0217)   norepinephrine  (LEVOPHED ) Adult infusion Stopped (01/08/24 0903)     LOS: 4 days       Penny Jeans, MD [facility] Pager [phone]  If 7PM-7AM, please contact night-coverag 01/09/2024, 9:16 AM

## 2024-01-09 NOTE — Progress Notes (Signed)
 Rounding Note    Patient Name: Penny Gill Date of Encounter: 01/09/2024  [facility] Cardiologist: Antionette Kirks, MD   Subjective   Patient seen on AM rounds. Denies any chest pain or shortness of breath. -702 mls output in the last 24 hours. No longer on pressor support. Blood pressure improving on midodrine .   Inpatient Medications    Scheduled Meds:  atorvastatin   40 mg Oral QHS   Chlorhexidine  Gluconate Cloth  6 each Topical Daily   DULoxetine   30 mg Oral Daily   enoxaparin  (LOVENOX ) injection  30 mg Subcutaneous Q24H   feeding supplement  237 mL Oral TID BM   folic acid   1 mg Oral Daily   insulin  aspart  0-15 Units Subcutaneous Q4H   midodrine   10 mg Oral TID WC   multivitamin with minerals  1 tablet Oral Daily   phosphorus  500 mg Oral BID   sodium chloride  flush  3 mL Intravenous Q12H   thiamine   100 mg Oral Daily   Continuous Infusions:  cefTRIAXone  (ROCEPHIN )  IV Stopped (01/09/24 0012)   doxycycline  (VIBRAMYCIN ) IV Stopped (01/09/24 0217)   magnesium  sulfate bolus IVPB 50 mL/hr at 01/09/24 0600   norepinephrine  (LEVOPHED ) Adult infusion Stopped (01/08/24 0903)   PRN Meds: acetaminophen , ipratropium-albuterol , ondansetron  (ZOFRAN ) IV, sodium chloride  flush   Vital Signs    Vitals:   01/09/24 0500 01/09/24 0530 01/09/24 0600 01/09/24 0630  BP: 112/84 121/79 115/74 118/82  Pulse: 97 (!) 50 92 93  Resp: 18 18    Temp:      TempSrc:      SpO2: 96% 95% 96% 96%  Weight:      Height:        Intake/Output Summary (Last 24 hours) at 01/09/2024 0744 Last data filed at 01/09/2024 0600 Gross per 24 hour  Intake 1247.92 ml  Output 1950 ml  Net -702.08 ml      01/09/2024    3:50 AM 01/08/2024    3:41 AM 01/07/2024    8:00 PM  Last 3 Weights  Weight (lbs) 74 lb 8.3 oz 74 lb 8.3 oz 74 lb 8.3 oz  Weight (kg) 33.8 kg 33.8 kg 33.8 kg      Telemetry    Sinus rhythm with sinus tach with rate of 80-100, short bursts of NSVT and occasional  unifocal PVCs noted overnight- Personally Reviewed  ECG    No new tracings- Personally Reviewed  Physical Exam   GEN: No acute distress.   Neck: No JVD Cardiac: RRR, no murmurs, rubs, or gallops.  Respiratory: Clear with mildly diminished bases to auscultation bilaterally.  Respirations are unlabored at rest on room air GI: Soft, nontender, non-distended  MS: No edema; No deformity. Neuro:  Nonfocal  Psych: Normal affect   Labs    High Sensitivity Troponin:   Recent Labs  Lab 01/05/24 1818 01/07/24 1958 01/07/24 2207 01/08/24 0635 01/08/24 0930  TROPONINIHS 380* 135* 1,015* 4,247* 3,994*     Chemistry Recent Labs  Lab 01/06/24 6213 01/07/24 0445 01/07/24 1958 01/08/24 0635 01/09/24 0431  NA 142   < > 135 140 139  K 3.2*   < > 4.5 4.2 3.8  CL 108   < > 104 105 108  CO2 25   < > 18* 25 24  GLUCOSE 113*   < > 292* 122* 132*  BUN 24*   < > 32* 31* 19  CREATININE 1.14*   < > 1.41* 1.09* 0.81  CALCIUM   8.7*   < > 7.9* 8.3* 8.4*  MG 1.7  --  2.1  --  1.6*  PROT  --   --  5.4* 5.9*  --   ALBUMIN  --   --  2.4* 2.6*  --   AST  --   --  677* 289*  --   ALT  --   --  321* 306*  --   ALKPHOS  --   --  269* 261*  --   BILITOT  --   --  1.0 0.7  --   GFRNONAA 54*   < > 42* 57* >60  ANIONGAP 9   < > 13 10 7    < > = values in this interval not displayed.    Lipids No results for input(s): "CHOL", "TRIG", "HDL", "LABVLDL", "LDLCALC", "CHOLHDL" in the last 168 hours.  Hematology Recent Labs  Lab 01/07/24 1958 01/08/24 0635 01/09/24 0431  WBC 6.9 13.4* 7.7  RBC 3.79* 3.73* 3.70*  HGB 11.0* 11.2* 11.0*  HCT 34.3* 34.2* 33.6*  MCV 90.5 91.7 90.8  MCH 29.0 30.0 29.7  MCHC 32.1 32.7 32.7  RDW 12.6 12.6 12.4  PLT 237 279 253   Thyroid   Recent Labs  Lab 01/07/24 2207  TSH 1.529    BNP Recent Labs  Lab 01/05/24 1438  BNP 1,852.3*    DDimer  Recent Labs  Lab 01/07/24 1958  DDIMER 1.79*     Radiology    CT Angio Chest Pulmonary Embolism (PE) W or WO  Contrast Result Date: 01/07/2024 CLINICAL DATA:  Positive D-dimer abdomen pain EXAM: CT ANGIOGRAPHY CHEST CT ABDOMEN AND PELVIS WITH CONTRAST TECHNIQUE: Multidetector CT imaging of the chest was performed using the standard protocol during bolus administration of intravenous contrast. Multiplanar CT image reconstructions and MIPs were obtained to evaluate the vascular anatomy. Multidetector CT imaging of the abdomen and pelvis was performed using the standard protocol during bolus administration of intravenous contrast. RADIATION DOSE REDUCTION: This exam was performed according to the departmental dose-optimization program which includes automated exposure control, adjustment of the mA and/or kV according to patient size and/or use of iterative reconstruction technique. CONTRAST:  75mL OMNIPAQUE  IOHEXOL  350 MG/ML SOLN COMPARISON:  Chest x-ray 01/07/2024, CT chest abdomen pelvis 11/14/2023 FINDINGS: CTA CHEST FINDINGS Cardiovascular: Satisfactory opacification of the pulmonary arteries to the segmental level. No evidence of pulmonary embolism. Nonaneurysmal aorta. Cardiomegaly. No pericardial effusion Mediastinum/Nodes: Patent trachea. No suspicious thyroid  mass. No suspicious adenopathy. Esophagus within normal limits. Lungs/Pleura: Advanced emphysema. Calcified granuloma in the left lung base. No acute airspace disease, pleural effusion or pneumothorax Musculoskeletal: No acute osseous abnormality. Review of the MIP images confirms the above findings. CT ABDOMEN and PELVIS FINDINGS Hepatobiliary: No gallstone. Mild pericholecystic fluid or wall thickening. No bilary dilatation. Pancreas: Unremarkable. No pancreatic ductal dilatation or surrounding inflammatory changes. Spleen: Normal in size without focal abnormality. Adrenals/Urinary Tract: Bilateral adrenal glands are normal. Cortical scarring of the rightkidney. Nonobstructing right kidney stones. Patchy hypoenhancement of the kidneys on delayed images.  Bladder is distended. Small amount of air in the bladder with catheter. Stomach/Bowel: Stomach is within normal limits. No evidence of bowel wall thickening, distention, or inflammatory changes. Vascular/Lymphatic: Aortic atherosclerosis. No enlarged abdominal or pelvic lymph nodes. Reproductive: 8.9 by 6 cm posterior pelvic mass, stable and probably uterine fibroids given lack of interval change. Other: No free air.Small free fluid in the pelvis. Musculoskeletal: No acute or significant osseous findings. Review of the MIP images confirms the above findings. IMPRESSION:  1. Negative for acute pulmonary embolus or aortic dissection. Advanced emphysema. 2. Cardiomegaly. 3. Patchy hypoenhancement of the kidneys on delayed images, question pyelonephritis. Nonobstructing right kidney stones. Cortical scarring in the right kidney. 4. Mild gallbladder wall thickening or small volume pericholecystic fluid. No calcified stones. Correlate with US  if indicated. 5. Small free fluid in the pelvis. 6. Stable 8.9 cm posterior pelvic mass, probably uterine fibroids given lack of interval change. Aortic Atherosclerosis (ICD10-I70.0) and Emphysema (ICD10-J43.9). Electronically Signed   By: Esmeralda Hedge M.D.   On: 01/07/2024 23:37   CT ABDOMEN PELVIS W CONTRAST Result Date: 01/07/2024 CLINICAL DATA:  Positive D-dimer abdomen pain EXAM: CT ANGIOGRAPHY CHEST CT ABDOMEN AND PELVIS WITH CONTRAST TECHNIQUE: Multidetector CT imaging of the chest was performed using the standard protocol during bolus administration of intravenous contrast. Multiplanar CT image reconstructions and MIPs were obtained to evaluate the vascular anatomy. Multidetector CT imaging of the abdomen and pelvis was performed using the standard protocol during bolus administration of intravenous contrast. RADIATION DOSE REDUCTION: This exam was performed according to the departmental dose-optimization program which includes automated exposure control, adjustment of the  mA and/or kV according to patient size and/or use of iterative reconstruction technique. CONTRAST:  75mL OMNIPAQUE  IOHEXOL  350 MG/ML SOLN COMPARISON:  Chest x-ray 01/07/2024, CT chest abdomen pelvis 11/14/2023 FINDINGS: CTA CHEST FINDINGS Cardiovascular: Satisfactory opacification of the pulmonary arteries to the segmental level. No evidence of pulmonary embolism. Nonaneurysmal aorta. Cardiomegaly. No pericardial effusion Mediastinum/Nodes: Patent trachea. No suspicious thyroid  mass. No suspicious adenopathy. Esophagus within normal limits. Lungs/Pleura: Advanced emphysema. Calcified granuloma in the left lung base. No acute airspace disease, pleural effusion or pneumothorax Musculoskeletal: No acute osseous abnormality. Review of the MIP images confirms the above findings. CT ABDOMEN and PELVIS FINDINGS Hepatobiliary: No gallstone. Mild pericholecystic fluid or wall thickening. No bilary dilatation. Pancreas: Unremarkable. No pancreatic ductal dilatation or surrounding inflammatory changes. Spleen: Normal in size without focal abnormality. Adrenals/Urinary Tract: Bilateral adrenal glands are normal. Cortical scarring of the rightkidney. Nonobstructing right kidney stones. Patchy hypoenhancement of the kidneys on delayed images. Bladder is distended. Small amount of air in the bladder with catheter. Stomach/Bowel: Stomach is within normal limits. No evidence of bowel wall thickening, distention, or inflammatory changes. Vascular/Lymphatic: Aortic atherosclerosis. No enlarged abdominal or pelvic lymph nodes. Reproductive: 8.9 by 6 cm posterior pelvic mass, stable and probably uterine fibroids given lack of interval change. Other: No free air.Small free fluid in the pelvis. Musculoskeletal: No acute or significant osseous findings. Review of the MIP images confirms the above findings. IMPRESSION: 1. Negative for acute pulmonary embolus or aortic dissection. Advanced emphysema. 2. Cardiomegaly. 3. Patchy  hypoenhancement of the kidneys on delayed images, question pyelonephritis. Nonobstructing right kidney stones. Cortical scarring in the right kidney. 4. Mild gallbladder wall thickening or small volume pericholecystic fluid. No calcified stones. Correlate with US  if indicated. 5. Small free fluid in the pelvis. 6. Stable 8.9 cm posterior pelvic mass, probably uterine fibroids given lack of interval change. Aortic Atherosclerosis (ICD10-I70.0) and Emphysema (ICD10-J43.9). Electronically Signed   By: Esmeralda Hedge M.D.   On: 01/07/2024 23:37   DG Chest Port 1 View Result Date: 01/07/2024 CLINICAL DATA:  Central line EXAM: PORTABLE CHEST 1 VIEW COMPARISON:  01/07/2024, 01/05/2024 FINDINGS: Right IJ central venous catheter tip over the cavoatrial region. No visible right pneumothorax. Stable cardiomediastinal silhouette with mild airspace opacity in the left infrahilar lung. IMPRESSION: Right IJ central venous catheter tip over the cavoatrial region. No visible pneumothorax. Mild left  infrahilar atelectasis or infiltrate Electronically Signed   By: Esmeralda Hedge M.D.   On: 01/07/2024 22:49   DG Chest Port 1 View Result Date: 01/07/2024 CLINICAL DATA:  Respiratory distress EXAM: PORTABLE CHEST 1 VIEW COMPARISON:  01/05/2024 FINDINGS: Perihilar and bibasilar pulmonary infiltrates have significantly improved in the interval. Background coarsening of the pulmonary interstitium is again noted. No confluent pulmonary infiltrate. No pneumothorax or pleural effusion. Cardiac size is within normal limits. Pulmonary vascularity is normal. No acute bone abnormality. IMPRESSION: 1. Significant interval improvement in perihilar and bibasilar pulmonary infiltrates. Electronically Signed   By: Worthy Heads M.D.   On: 01/07/2024 20:25    Cardiac Studies   [facility] 12/22/2023 Conclusions: No angiographically significant coronary artery disease.  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 Normal left and right  heart filling pressures (RA 3, RV 26/3, PCWP 8, LVEDP 10 mmHg). Upper normal pulmonary artery pressure with elevated vascular resistance (PA 27/14, mean 18 mmHg; PVR 4 WU). Mildly reduced Fick cardiac index (CO 2.5 L/min, CI 2.1 L/min/m^2).   Recommendations: Maintain net even to slightly positive fluid balance over next 24 hours. Escalate goal-directed medical therapy as tolerated; will add losartan  12.5 mg daily.    2D echo 11/15/2023 1. Left ventricular ejection fraction, by estimation, is 30 to 35%. The  left ventricle has moderate to severely decreased function. The left  ventricle demonstrates global hypokinesis. The left ventricular internal  cavity size was mildly dilated. There  is mild left ventricular hypertrophy. Left ventricular diastolic  parameters are consistent with Grade I diastolic dysfunction (impaired  relaxation).   2. Right ventricular systolic function is normal. The right ventricular  size is normal.   3. The mitral valve is normal in structure. Mild mitral valve  regurgitation.   4. The aortic valve is tricuspid. Aortic valve regurgitation is not  visualized.   5. The inferior vena cava is normal in size with greater than 50%  respiratory variability, suggesting right atrial pressure of 3 mmHg.    Patient Profile     63 y.o. female with past medical history of chronic HFrEF due to nonischemic cardiomyopathy, hypertension, hyperlipidemia, type 2 diabetes, polysubstance abuse including ongoing tobacco and cocaine use, COPD, sickle cell trait, chronic pain, depression, who we are seeing and evaluated for elevated troponin in the setting of acute respiratory failure with hypoxia and shock.  Assessment & Plan    Acute on chronic HFrEF -Complaints with shortness of breath that is now improved -Last echocardiogram revealed LVEF of 30-25% -BNP 1852.3 -GDMT placed on hold due to hypotension requiring vasopressors -Restarting low-dose losartan  12.5 mg daily,  spironolactone , carvedilol , and diureses remain on hold - Try to wean off midodrine  as blood pressure allows -Appears to be euvolemic on exam -NYHA class II symptoms -Daily weights and I's and O's -Recommend maintaining a net even fluid balance  Elevated high-sensitivity troponin -High-sensitivity troponin peaked at 40-47 -Continues to remain chest pain-free -Recent right and left heart catheterization showed angiographically normal coronaries -Likely demand ischemia related to heart failure, hypotension, circulatory shock, and recent cocaine use - Continued on statin therapy -Started on aspirin  81 mg daily -No plans for further ischemic workup at this time -EKG as needed for pain or changes  Acute respiratory failure with hypoxia -Improved -Maintaining oxygen saturations on room air -Continue bronchodilators as needed -Ongoing management per PCCM  Circulatory shock with hypotension -Previously with hypotension requiring vasopressor support -Has been off of Levophed  for 24 hours -Continued on midodrine  10 mg 3  times daily - Blood pressure 128/82 -Vital signs per unit protocol -Continued on IV antibiotics -Continue to trend labs monitor WBCs to daily CBC and fever curve -Ongoing management per PCCM  Mixed hyperlipidemia -Continued on atorvastatin  40 mg daily  Type 2 diabetes -Hemoglobin A1c 5.8 -Continued on insulin  therapy -Farxiga  remains on hold due to previous AKI and metabolic acidosis - Ongoing management per PCCM  COPD -Not in acute exacerbation -Continue on as needed bronchodilators -Currently maintaining oxygen saturations on room air -Supportive care  Cocaine abuse/tobacco abuse -Total cessation continues to be recommended     For questions or updates, please contact [facility] Please consult [email or web address] for contact info under        Signed, Cathlene Gardella, NP  01/09/2024, 7:44 AM

## 2024-01-09 NOTE — Progress Notes (Signed)
 PHARMACY CONSULT NOTE - FOLLOW UP  Pharmacy Consult for Electrolyte Monitoring and Replacement   Recent Labs: Potassium (mmol/L)  Date Value  01/09/2024 3.8  07/05/2014 3.8   Magnesium  (mg/dL)  Date Value  95/62/1308 1.6 (L)   Calcium  (mg/dL)  Date Value  65/78/4696 8.4 (L)   Calcium , Total (mg/dL)  Date Value  29/52/8413 8.6   Albumin (g/dL)  Date Value  24/40/1027 2.6 (L)  07/05/2014 3.7   Phosphorus (mg/dL)  Date Value  25/36/6440 2.0 (L)   Sodium (mmol/L)  Date Value  01/09/2024 139  07/05/2014 141     Assessment: 63 y.o. female with cocaine and tobacco abuse, HTN, HLD, DM, COPD not on O2, depression, chronic pain, sickle cell trait who presented with shortness of breath worsening over several days. Pharmacy is asked to follow and replace electrolytes while in CCU. On 5/17, 5 beat run of Vtach. Pt has hx of HFrEF and appears to be euvolemic per MD notes.   Goal of Therapy:  Electrolytes WNL  Plan:  Medical team ordered Mg 4 g IV x 1 and phos 2 tabs BID x 2 doses.  F/u with AM labs.   Trinidad Funk ,PharmD Clinical Pharmacist 01/09/2024 7:29 AM

## 2024-01-09 NOTE — Significant Event (Signed)
       CROSS COVER NOTE  NAME: Penny Gill MRN: [id number] DOB : 25-Jul-1961 ATTENDING PHYSICIAN: Alphonsus Jeans, MD    Date of Service   01/09/2024   HPI/Events of Note   Medssage from rn This patient has had a 5 beat run of VTach. Patient was sleeping and no other symptoms. Mg was 1.6 and Phos was 2.0 this AM on labs   Interventions   Assessment/Plan: 4 gm mag IV  K phos 500 mg tabs bid x 4  doses        Penny Peon NP [facility] Cross Cover 7pm-7am - check amion for availability Pager [phone]

## 2024-01-09 NOTE — Progress Notes (Signed)
  Echocardiogram 2D Echocardiogram has been performed. A Saline Microcavitation (Bubble Study) was requested and performed.  Penny Gill 01/09/2024, 10:42 AM

## 2024-01-10 DIAGNOSIS — I5043 Acute on chronic combined systolic (congestive) and diastolic (congestive) heart failure: Secondary | ICD-10-CM | POA: Diagnosis not present

## 2024-01-10 LAB — GLUCOSE, CAPILLARY
Glucose-Capillary: 104 mg/dL — ABNORMAL HIGH (ref 70–99)
Glucose-Capillary: 106 mg/dL — ABNORMAL HIGH (ref 70–99)
Glucose-Capillary: 222 mg/dL — ABNORMAL HIGH (ref 70–99)
Glucose-Capillary: 153 mg/dL — ABNORMAL HIGH (ref 70–99)
Glucose-Capillary: 137 mg/dL — ABNORMAL HIGH (ref 70–99)

## 2024-01-10 LAB — HEPATIC FUNCTION PANEL
ALT: 120 U/L — ABNORMAL HIGH (ref 0–44)
AST: 40 U/L (ref 15–41)
Albumin: 2.4 g/dL — ABNORMAL LOW (ref 3.5–5.0)
Alkaline Phosphatase: 179 U/L — ABNORMAL HIGH (ref 38–126)
Bilirubin, Direct: <0.1 mg/dL (ref 0.0–0.2)
Total Bilirubin: 0.6 mg/dL (ref 0.0–1.2)
Total Protein: 5.9 g/dL — ABNORMAL LOW (ref 6.5–8.1)

## 2024-01-10 LAB — MAGNESIUM: Magnesium: 2 mg/dL (ref 1.7–2.4)

## 2024-01-10 LAB — BASIC METABOLIC PANEL WITH GFR
Anion gap: 9 (ref 5–15)
BUN: 21 mg/dL (ref 8–23)
CO2: 24 mmol/L (ref 22–32)
Calcium: 8.5 mg/dL — ABNORMAL LOW (ref 8.9–10.3)
Chloride: 108 mmol/L (ref 98–111)
Creatinine, Ser: 0.69 mg/dL (ref 0.44–1.00)
GFR, Estimated: >60 mL/min (ref >60)
Glucose, Bld: 98 mg/dL (ref 70–99)
Potassium: 4 mmol/L (ref 3.5–5.1)
Sodium: 141 mmol/L (ref 135–145)

## 2024-01-10 LAB — CBC
HCT: 34.8 % — ABNORMAL LOW (ref 36.0–46.0)
Hemoglobin: 11.6 g/dL — ABNORMAL LOW (ref 12.0–15.0)
MCH: 29.5 pg (ref 26.0–34.0)
MCHC: 33.3 g/dL (ref 30.0–36.0)
MCV: 88.5 fL (ref 80.0–100.0)
Platelets: 299 10*3/uL (ref 150–400)
RBC: 3.93 MIL/uL (ref 3.87–5.11)
RDW: 12.2 % (ref 11.5–15.5)
WBC: 7.4 10*3/uL (ref 4.0–10.5)
nRBC: 0 % (ref 0.0–0.2)

## 2024-01-10 LAB — PHOSPHORUS: Phosphorus: 3.4 mg/dL (ref 2.5–4.6)

## 2024-01-10 MED ORDER — DAPAGLIFLOZIN PROPANEDIOL 10 MG PO TABS
10.0000 mg | ORAL_TABLET | Freq: Every day | ORAL | Status: DC
  Administered 2024-01-10 – 2024-01-11 (×2): 10 mg via ORAL
  Filled 2024-01-10 (×3): qty 1

## 2024-01-10 MED ORDER — SPIRONOLACTONE 25 MG PO TABS
25.0000 mg | ORAL_TABLET | Freq: Every day | ORAL | Status: DC
  Administered 2024-01-10 – 2024-01-11 (×2): 25 mg via ORAL
  Filled 2024-01-10 (×3): qty 1

## 2024-01-10 MED ORDER — TRAZODONE HCL 50 MG PO TABS
25.0000 mg | ORAL_TABLET | Freq: Every evening | ORAL | Status: DC | PRN
  Administered 2024-01-10: 25 mg via ORAL
  Filled 2024-01-10: qty 1

## 2024-01-10 NOTE — Progress Notes (Signed)
 Cardiology Progress Note  Patient ID: Penny Gill MRN: [id number] DOB: 05/13/1961 Date of Encounter: 01/10/2024 Primary Cardiologist: Antionette Kirks, MD  Subjective   Chief Complaint: SOB  HPI: Out of ICU.  Blood pressure is stable.  Reports she is a little short of breath.  ROS:  All other ROS reviewed and negative. Pertinent positives noted in the HPI.     Telemetry  Overnight telemetry shows sinus tachycardia heart rate 100s, which I personally reviewed.   Physical Exam   Vitals:   01/10/24 0000 01/10/24 0100 01/10/24 0437 01/10/24 0841  BP:   (!) 135/107 (!) 150/112  Pulse:   (!) 114 (!) 52  Resp: 19 17 18 14   Temp:   98.3 F (36.8 C) 98.2 F (36.8 C)  TempSrc:    Oral  SpO2:   93% 100%  Weight:      Height:        Intake/Output Summary (Last 24 hours) at 01/10/2024 1151 Last data filed at 01/10/2024 0600 Gross per 24 hour  Intake 360 ml  Output --  Net 360 ml       01/09/2024    3:50 AM 01/08/2024    3:41 AM 01/07/2024    8:00 PM  Last 3 Weights  Weight (lbs) 74 lb 8.3 oz 74 lb 8.3 oz 74 lb 8.3 oz  Weight (kg) 33.8 kg 33.8 kg 33.8 kg    Body mass index is 14.55 kg/m.  General: Well nourished, well developed, in no acute distress Head: Atraumatic, normal size  Eyes: PEERLA, EOMI  Neck: Supple, no JVD Endocrine: No thryomegaly Cardiac: Normal S1, S2; RRR; no murmurs, rubs, or gallops Lungs: Clear to auscultation bilaterally, no wheezing, rhonchi or rales  Abd: Soft, nontender, no hepatomegaly  Ext: No edema, pulses 2+ Musculoskeletal: No deformities, BUE and BLE strength normal and equal Skin: Warm and dry, no rashes   Neuro: Alert and oriented to person, place, time, and situation, CNII-XII grossly intact, no focal deficits  Psych: Normal mood and affect   Cardiac Studies  TTE 01/09/2024  1. Left ventricular ejection fraction, by estimation, is 25 to 30%. The  left ventricle has severely decreased function. The left ventricle  demonstrates global  hypokinesis. The left ventricular internal cavity size  was mildly dilated. Left ventricular  diastolic function could not be evaluated.   2. Right ventricular systolic function is mildly reduced. The right  ventricular size is normal. There is mildly elevated pulmonary artery  systolic pressure. The estimated right ventricular systolic pressure is  41.4 mmHg.   3. Left atrial size was severely dilated.   4. PFO is present by color doppler but bubble is negative. Suspect  elevated LAP is reason fro negative bubble. Agitated saline contrast  bubble study was negative, with no evidence of any interatrial shunt.  There is a small patent foramen ovale with  predominantly left to right shunting across the atrial septum.   5. Right atrial size was mildly dilated.   6. Severe MR is present due to restricted PMVL from cardiomyopathy. The  mitral valve is abnormal. Severe mitral valve regurgitation. No evidence  of mitral stenosis.   7. The aortic valve is tricuspid. Aortic valve regurgitation is not  visualized. No aortic stenosis is present.   8. The inferior vena cava is normal in size with greater than 50%  respiratory variability, suggesting right atrial pressure of 3 mmHg.   [facility] 12/22/2023 Conclusions: No angiographically significant coronary artery disease.  Findings are consistent  with nonischemic cardiomyopathy. Normal left and right heart filling pressures (RA 3, RV 26/3, PCWP 8, LVEDP 10 mmHg). Upper normal pulmonary artery pressure with elevated vascular resistance (PA 27/14, mean 18 mmHg; PVR 4 WU). Mildly reduced Fick cardiac index (CO 2.5 L/min, CI 2.1 L/min/m^2).  Patient Profile  Penny Gill is a 63 y.o. female with cocaine abuse, systolic heart failure, diabetes, hypertension, COPD admitted on 01/05/2024 with acute on chronic systolic heart failure.  Course complicated by hypotension and shock secondary to hypovolemia and diarrhea.  Cardiology was consulted for further  management.  Assessment & Plan   # Acute on chronic systolic heart failure, EF 25-30% # Severe mitral valve regurgitation # Hypovolemic shock, resolved - Initially admitted with acute heart failure.  Underwent diuresis and had diarrhea.  Suffered from shock which was attributed to hypovolemia.  She was briefly on pressors in the ICU and her overall condition has improved.  She had evidence of an acute kidney injury as well as acute liver injury.  All of this has resolved. - Blood pressure is improved.  No signs of cardiogenic shock. -Nonischemic cardiomyopathy.  Left heart cath with normal coronaries.  Likely related to cocaine abuse. - Echo now with EF 25 to 30%.  Severe mitral valve regurgitation.  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  The posterior mitral valve leaflet is restricted.  Would really recommend medical management.  She has ongoing cocaine abuse and is not a candidate for advanced therapies including MitraClip.  Best option is aggressive medical therapy.  I did stress the importance of cocaine cessation. - Pulmonary pressures are mildly elevated on echo related to severe MR.  Does not appear volume overloaded.  No need for further aggressive diuresis. - Recent right heart cath showed marginal hemodynamics.  I have added digoxin 0.125 mg daily.  We can try to get on a beta-blocker but I would be cautious with this.  I have added Aldactone  25 mg daily. - Okay to continue home Farxiga . - We will add Entresto or ARB likely tomorrow.  We can see how she does. - She does have sinus tachycardia low 100s.  This is concerning for low output.  However kidney function and liver enzymes are improving.  I think the best course is digoxin at this time.  She is really not a great candidate for aggressive or advanced cardiovascular care.  # PNA? - Antibiotics per primary team.  # AKI # Acute liver injury - All resolving.  Does not appear to be ongoing  cardiogenic shock.  # Elevated troponin, demand ischemia - Left heart catheterization with normal coronaries.  Was admitted to the ICU with shock.  Suspect troponin elevation is secondary to this.  Does not represent acute coronary syndrome.  # Cocaine abuse - Cessation advised      For questions or updates, please contact [facility] Please consult [email or web address] for contact info under      Signed, Melodee Spruce T. Rolm Clos, MD, [facility] [place] [facility]  [facility]  01/10/2024 11:51 AM

## 2024-01-10 NOTE — Plan of Care (Signed)
  Problem: Education: Goal: Knowledge of General Education information will improve Description: Including pain rating scale, medication(s)/side effects and non-pharmacologic comfort measures Outcome: Progressing   Problem: Clinical Measurements: Goal: Ability to maintain clinical measurements within normal limits will improve Outcome: Progressing   Problem: Clinical Measurements: Goal: Will remain free from infection Outcome: Progressing   Problem: Clinical Measurements: Goal: Diagnostic test results will improve Outcome: Progressing   Problem: Nutrition: Goal: Adequate nutrition will be maintained Outcome: Progressing   Problem: Coping: Goal: Level of anxiety will decrease Outcome: Progressing   Problem: Safety: Goal: Ability to remain free from injury will improve Outcome: Progressing   Problem: Fluid Volume: Goal: Ability to maintain a balanced intake and output will improve Outcome: Progressing   Problem: Nutritional: Goal: Maintenance of adequate nutrition will improve Outcome: Progressing  Plan of care ongoing, see MAR see flowsheet

## 2024-01-10 NOTE — Progress Notes (Signed)
 PHARMACY CONSULT NOTE - FOLLOW UP  Pharmacy Consult for Electrolyte Monitoring and Replacement   Recent Labs: Potassium (mmol/L)  Date Value  01/10/2024 4.0  07/05/2014 3.8   Magnesium  (mg/dL)  Date Value  40/98/1191 2.0   Calcium  (mg/dL)  Date Value  47/82/9562 8.5 (L)   Calcium , Total (mg/dL)  Date Value  13/03/6577 8.6   Albumin (g/dL)  Date Value  46/96/2952 2.6 (L)  07/05/2014 3.7   Phosphorus (mg/dL)  Date Value  84/13/2440 3.4   Sodium (mmol/L)  Date Value  01/10/2024 141  07/05/2014 141     Assessment: 63 y.o. female with cocaine and tobacco abuse, HTN, HLD, DM, COPD not on O2, depression, chronic pain, sickle cell trait who presented with shortness of breath worsening over several days. Pharmacy is asked to follow and replace electrolytes while in CCU. On 5/17, 5 beat run of Vtach. Pt has hx of HFrEF and appears to be euvolemic per MD notes.   Goal of Therapy:  Electrolytes WNL  Plan:  No replacement needed F/u with AM labs.   Penny Gill ,PharmD Clinical Pharmacist 01/10/2024 6:44 AM

## 2024-01-10 NOTE — Progress Notes (Signed)
 PROGRESS NOTE    Penny Gill  ZOX:[id number] DOB: 05-10-1961 DOA: 01/05/2024 PCP: Pcp, No   Assessment & Plan:   Principal Problem:   Acute on chronic HFrEF (heart failure with reduced ejection fraction) (HCC) Active Problems:   Shock (HCC)  Assessment and Plan:  Acute on chronic combined CHF: holding coreg , losartan , lasix  as per cardio. Restart home dose of farxiga . D/c midodrine  as per cardio. 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 Continue on digoxin.  Severe MR: management as per cario   Acute hypoxic respiratory failure: initial oxygen saturation was 88% on 2 L of oxygen. Weaned off of supplemental oxygen. Resolved  Possible CAP: continue on IV rocephin  & doxy x 5 days. Continue on bronchodilators & encourage incentive spirometry   Hypomagnesemia: WNL today   Hypophosphatemia: WNL today   Hypotension: likely secondary to circulatory shock. See NP Rust-Chester on how pt met circulatory shock criteria. Weaned off of pressors. Keep MAP >65. Resolved  Hypokalemia:  WNL today    Elevated troponins: likely secondary to demand ischemia & cocaine use. No chest pain currently. Right and left heart cath in March 2025 showed normal coronaries. Cardio following and recs apprec   Cocaine abuse: urine drug screen was positive for cocaine. Received cessation counseling already.  Transaminitis: etiology unclear, likely secondary to cocaine abuse. AST is WNL and ALT is still elevated but trending down   Diarrhea: started on 01/07/24. GI PCR panel was neg. Resolved    Fever: likely secondary to CAP. Blood cxs NGTD. UA showed large leukocytes but rare bacteria. No fever > 72 hrs. Resolved  COPD: w/o exacerbation.  Bronchodilators prn        DVT prophylaxis: lovenox  Code Status: full  Family Communication:  Disposition Plan: likely d/c back to nursing home   Status is: Inpatient Remains inpatient appropriate because: severity of  illness    Level of care: Progressive Consultants:  ICU cardio  Procedures:    Antimicrobials:   Subjective: Pt c/o malaise   Objective: Vitals:   01/10/24 0000 01/10/24 0100 01/10/24 0437 01/10/24 0841  BP:   (!) 135/107 (!) 150/112  Pulse:   (!) 114 (!) 52  Resp: 19 17 18 14   Temp:   98.3 F (36.8 C) 98.2 F (36.8 C)  TempSrc:    Oral  SpO2:   93% 100%  Weight:      Height:        Intake/Output Summary (Last 24 hours) at 01/10/2024 0844 Last data filed at 01/10/2024 0600 Gross per 24 hour  Intake 360 ml  Output --  Net 360 ml   Filed Weights   01/07/24 2000 01/08/24 0341 01/09/24 0350  Weight: 33.8 kg 33.8 kg 33.8 kg    Examination:  General exam: appears comfortable  Respiratory system: diminished breath sounds b/l  Cardiovascular system: S1/S2+. No rubs or clicks  Gastrointestinal system: abd is soft, NT, ND & hypoactive bowel sounds Central nervous system: alert & awake. Moves all extremities  Psychiatry: Judgement and insight appears improved. Flat mood and affect    Data Reviewed: I have personally reviewed following labs and imaging studies  CBC: Recent Labs  Lab 01/07/24 0445 01/07/24 1958 01/08/24 0635 01/09/24 0431 01/10/24 0434  WBC 16.8* 6.9 13.4* 7.7 7.4  NEUTROABS  --  4.9  --   --   --   HGB 12.1 11.0* 11.2* 11.0* 11.6*  HCT 35.8* 34.3* 34.2* 33.6* 34.8*  MCV 87.5 90.5 91.7 90.8 88.5  PLT  265 237 279 253 299   Basic Metabolic Panel: Recent Labs  Lab 01/06/24 0338 01/07/24 0445 01/07/24 1958 01/08/24 0635 01/09/24 0431 01/10/24 0434  NA 142 138 135 140 139 141  K 3.2* 3.0* 4.5 4.2 3.8 4.0  CL 108 104 104 105 108 108  CO2 25 26 18* 25 24 24   GLUCOSE 113* 98 292* 122* 132* 98  BUN 24* 29* 32* 31* 19 21  CREATININE 1.14* 1.04* 1.41* 1.09* 0.81 0.69  CALCIUM  8.7* 8.0* 7.9* 8.3* 8.4* 8.5*  MG 1.7  --  2.1  --  1.6* 2.0  PHOS  --   --  4.3  --  2.0* 3.4   GFR: Estimated Creatinine Clearance: 38.9 mL/min (by C-G formula  based on SCr of 0.69 mg/dL). Liver Function Tests: Recent Labs  Lab 01/07/24 1958 01/08/24 0635  AST 677* 289*  ALT 321* 306*  ALKPHOS 269* 261*  BILITOT 1.0 0.7  PROT 5.4* 5.9*  ALBUMIN 2.4* 2.6*   Recent Labs  Lab 01/07/24 1958  LIPASE 31   No results for input(s): "AMMONIA" in the last 168 hours. Coagulation Profile: No results for input(s): "INR", "PROTIME" in the last 168 hours. Cardiac Enzymes: No results for input(s): "CKTOTAL", "CKMB", "CKMBINDEX", "TROPONINI" in the last 168 hours. BNP (last 3 results) No results for input(s): "PROBNP" in the last 8760 hours. HbA1C: No results for input(s): "HGBA1C" in the last 72 hours. CBG: Recent Labs  Lab 01/09/24 1212 01/09/24 1706 01/09/24 2009 01/09/24 2357 01/10/24 0433  GLUCAP 183* 156* 87 109* 104*   Lipid Profile: No results for input(s): "CHOL", "HDL", "LDLCALC", "TRIG", "CHOLHDL", "LDLDIRECT" in the last 72 hours. Thyroid  Function Tests: Recent Labs    01/07/24 2207  TSH 1.529   Anemia Panel: No results for input(s): "VITAMINB12", "FOLATE", "FERRITIN", "TIBC", "IRON", "RETICCTPCT" in the last 72 hours. Sepsis Labs: Recent Labs  Lab 01/05/24 1438 01/07/24 2207 01/08/24 0016  PROCALCITON 0.10  --   --   LATICACIDVEN  --  1.1 0.8    Recent Results (from the past 240 hours)  Culture, blood (Routine X 2) w Reflex to ID Panel     Status: None (Preliminary result)   Collection Time: 01/05/24 11:46 PM   Specimen: BLOOD  Result Value Ref Range Status   Specimen Description BLOOD RIGHT FA  Final   Special Requests   Final    BOTTLES DRAWN AEROBIC AND ANAEROBIC Blood Culture results may not be optimal due to an inadequate volume of blood received in culture bottles   Culture   Final    NO GROWTH 4 DAYS Performed at [facility], [address] [place], [place] [address]    Report Status PENDING  Incomplete  Culture, blood (Routine X 2) w Reflex to ID Panel     Status: None (Preliminary  result)   Collection Time: 01/05/24 11:46 PM   Specimen: BLOOD  Result Value Ref Range Status   Specimen Description BLOOD LEFT FA  Final   Special Requests   Final    BOTTLES DRAWN AEROBIC AND ANAEROBIC Blood Culture results may not be optimal due to an inadequate volume of blood received in culture bottles   Culture   Final    NO GROWTH 4 DAYS Performed at [facility], [address] [place], [place] [address]    Report Status PENDING  Incomplete  MRSA Next Gen by PCR, Nasal     Status: None   Collection Time: 01/07/24  8:20 PM  Specimen: Nasal Mucosa; Nasal Swab  Result Value Ref Range Status   MRSA by PCR Next Gen NOT DETECTED NOT DETECTED Final    Comment: (NOTE) The GeneXpert MRSA Assay (FDA approved for NASAL specimens only), is one component of a comprehensive MRSA colonization surveillance program. It is not intended to diagnose MRSA infection nor to guide or monitor treatment for MRSA infections. Test performance is not FDA approved in patients less than 13 years old. Performed at [facility], [address] [place], [place] [address]   Gastrointestinal Panel by PCR , Stool     Status: None   Collection Time: 01/08/24 11:12 AM   Specimen: Stool  Result Value Ref Range Status   Campylobacter species NOT DETECTED NOT DETECTED Final   Plesimonas shigelloides NOT DETECTED NOT DETECTED Final   Salmonella species NOT DETECTED NOT DETECTED Final   Yersinia enterocolitica NOT DETECTED NOT DETECTED Final   Vibrio species NOT DETECTED NOT DETECTED Final   Vibrio cholerae NOT DETECTED NOT DETECTED Final   Enteroaggregative E coli (EAEC) NOT DETECTED NOT DETECTED Final   Enteropathogenic E coli (EPEC) NOT DETECTED NOT DETECTED Final   Enterotoxigenic E coli (ETEC) NOT DETECTED NOT DETECTED Final   Shiga like toxin producing E coli (STEC) NOT DETECTED NOT DETECTED Final   Shigella/Enteroinvasive E coli (EIEC) NOT DETECTED NOT DETECTED Final    Cryptosporidium NOT DETECTED NOT DETECTED Final   Cyclospora cayetanensis NOT DETECTED NOT DETECTED Final   Entamoeba histolytica NOT DETECTED NOT DETECTED Final   Giardia lamblia NOT DETECTED NOT DETECTED Final   Adenovirus F40/41 NOT DETECTED NOT DETECTED Final   Astrovirus NOT DETECTED NOT DETECTED Final   Norovirus GI/GII NOT DETECTED NOT DETECTED Final   Rotavirus A NOT DETECTED NOT DETECTED Final   Sapovirus (I, II, IV, and V) NOT DETECTED NOT DETECTED Final    Comment: Performed at [facility], [address] [place], [place] [address]         Radiology Studies: ECHOCARDIOGRAM COMPLETE BUBBLE STUDY Result Date: 01/09/2024    ECHOCARDIOGRAM REPORT   Patient Name:   Penny Gill Date of Exam: 01/09/2024 Medical Rec #:  [id number]       Height:       60.0 in Accession #:    [id number]      Weight:       74.5 lb Date of Birth:  [birth year]       BSA:          1.227 m Patient Age:    63 years        BP:           118/82 mmHg Patient Gender: F               HR:           90 bpm. Exam Location:  [facility] Procedure: 2D Echo, Cardiac Doppler, Color Doppler, Strain Analysis and Saline            Contrast Bubble Study (Both Spectral and Color Flow Doppler were            utilized during procedure). Indications:     Murmur R01.1  History:         Patient has prior history of Echocardiogram examinations, most                  recent 11/15/2023.  Sonographer:     Clenton Czech DCS, FASE Referring Phys:  [id number] BRENDA MORRISON Diagnosing Phys:  Jackquelyn Mass MD  Sonographer Comments: Global longitudinal strain was attempted. IMPRESSIONS  1. Left ventricular ejection fraction, by estimation, is 25 to 30%. The left ventricle has severely decreased function. The left ventricle demonstrates global hypokinesis. The left ventricular internal cavity size was mildly dilated. Left ventricular diastolic function could not be evaluated.  2. Right ventricular systolic function is mildly reduced. The right  ventricular size is normal. There is mildly elevated pulmonary artery systolic pressure. The estimated right ventricular systolic pressure is 41.4 mmHg.  3. Left atrial size was severely dilated.  4. PFO is present by color doppler but bubble is negative. Suspect elevated LAP is reason fro negative bubble. Agitated saline contrast bubble study was negative, with no evidence of any interatrial shunt. There is a small patent foramen ovale with predominantly left to right shunting across the atrial septum.  5. Right atrial size was mildly dilated.  6. Severe MR is present due to restricted PMVL from cardiomyopathy. The mitral valve is abnormal. Severe mitral valve regurgitation. No evidence of mitral stenosis.  7. The aortic valve is tricuspid. Aortic valve regurgitation is not visualized. No aortic stenosis is present.  8. The inferior vena cava is normal in size with greater than 50% respiratory variability, suggesting right atrial pressure of 3 mmHg. Comparison(s): Changes from prior study are noted. LVEF 25-30%. Severe MR now present, appears functional due to restricted PMVL. FINDINGS  Left Ventricle: Left ventricular ejection fraction, by estimation, is 25 to 30%. The left ventricle has severely decreased function. The left ventricle demonstrates global hypokinesis. The left ventricular internal cavity size was mildly dilated. There is no left ventricular hypertrophy. Left ventricular diastolic function could not be evaluated due to mitral regurgitation (moderate or greater). Left ventricular diastolic function could not be evaluated. Right Ventricle: The right ventricular size is normal. No increase in right ventricular wall thickness. Right ventricular systolic function is mildly reduced. There is mildly elevated pulmonary artery systolic pressure. The tricuspid regurgitant velocity  is 3.10 m/s, and with an assumed right atrial pressure of 3 mmHg, the estimated right ventricular systolic pressure is 41.4 mmHg.  Left Atrium: Left atrial size was severely dilated. Right Atrium: Right atrial size was mildly dilated. Pericardium: There is no evidence of pericardial effusion. Mitral Valve: Severe MR is present due to restricted PMVL from cardiomyopathy. The mitral valve is abnormal. Severe mitral valve regurgitation. No evidence of mitral valve stenosis. MV peak gradient, 9.4 mmHg. The mean mitral valve gradient is 4.0 mmHg. Tricuspid Valve: The tricuspid valve is grossly normal. Tricuspid valve regurgitation is mild . No evidence of tricuspid stenosis. Aortic Valve: The aortic valve is tricuspid. Aortic valve regurgitation is not visualized. No aortic stenosis is present. Aortic valve peak gradient measures 4.4 mmHg. Pulmonic Valve: The pulmonic valve was grossly normal. Pulmonic valve regurgitation is mild. No evidence of pulmonic stenosis. Aorta: The aortic root and ascending aorta are structurally normal, with no evidence of dilitation. Venous: The inferior vena cava is normal in size with greater than 50% respiratory variability, suggesting right atrial pressure of 3 mmHg. IAS/Shunts: The interatrial septum is aneurysmal. The atrial septum is grossly normal. Agitated saline contrast was given intravenously to evaluate for intracardiac shunting. Agitated saline contrast bubble study was negative, with no evidence of any interatrial shunt. A small patent foramen ovale is detected with predominantly left to right shunting across the atrial septum.  LEFT VENTRICLE PLAX 2D LVIDd:         6.10 cm  Diastology LVIDs:         5.20 cm      LV e' medial:    7.40 cm/s LV PW:         1.00 cm      LV E/e' medial:  13.3 LV IVS:        0.70 cm      LV e' lateral:   11.10 cm/s LVOT diam:     1.80 cm      LV E/e' lateral: 8.9 LV SV:         29 LV SV Index:   23 LVOT Area:     2.54 cm  LV Volumes (MOD) LV vol d, MOD A2C: 114.0 ml LV vol d, MOD A4C: 145.0 ml LV vol s, MOD A2C: 77.0 ml LV vol s, MOD A4C: 96.0 ml LV SV MOD A2C:     37.0 ml  LV SV MOD A4C:     145.0 ml LV SV MOD BP:      42.4 ml RIGHT VENTRICLE RV Basal diam:  2.40 cm RV S prime:     10.80 cm/s TAPSE (M-mode): 1.3 cm LEFT ATRIUM           Index        RIGHT ATRIUM           Index LA diam:      4.10 cm 3.34 cm/m   RA Area:     11.50 cm LA Vol (A2C): 66.2 ml 53.96 ml/m  RA Volume:   28.70 ml  23.39 ml/m LA Vol (A4C): 44.1 ml 35.94 ml/m  AORTIC VALVE                 PULMONIC VALVE AV Area (Vmax): 1.88 cm     PV Vmax:          0.91 m/s AV Vmax:        105.00 cm/s  PV Peak grad:     3.3 mmHg AV Peak Grad:   4.4 mmHg     PR End Diast Vel: 12.96 msec LVOT Vmax:      77.40 cm/s LVOT Vmean:     49.100 cm/s LVOT VTI:       0.112 m  AORTA Ao Root diam: 3.30 cm Ao Asc diam:  3.00 cm MITRAL VALVE                  TRICUSPID VALVE MV Area (PHT): 4.60 cm       TR Peak grad:   38.4 mmHg MV Area VTI:   0.84 cm       TR Vmax:        310.00 cm/s MV Peak grad:  9.4 mmHg MV Mean grad:  4.0 mmHg       SHUNTS MV Vmax:       1.53 m/s       Systemic VTI:  0.11 m MV Vmean:      91.4 cm/s      Systemic Diam: 1.80 cm MV Decel Time: 165 msec MR Peak grad:    122.3 mmHg MR Mean grad:    76.0 mmHg MR Vmax:         553.00 cm/s MR Vmean:        402.0 cm/s MR PISA:         2.65 cm MR PISA Eff ROA: 15 mm MR PISA Radius:  0.65 cm MV E velocity: 98.50 cm/s MV A velocity: 65.50 cm/s MV E/A ratio:  1.50  Jackquelyn Mass MD Electronically signed by Jackquelyn Mass MD Signature Date/Time: 01/09/2024/2:23:54 PM    Final         Scheduled Meds:  aspirin  EC  81 mg Oral Daily   atorvastatin   40 mg Oral QHS   Chlorhexidine  Gluconate Cloth  6 each Topical Daily   digoxin  0.125 mg Oral Daily   DULoxetine   30 mg Oral Daily   enoxaparin  (LOVENOX ) injection  30 mg Subcutaneous Q24H   feeding supplement  237 mL Oral TID BM   folic acid   1 mg Oral Daily   insulin  aspart  0-15 Units Subcutaneous Q4H   multivitamin with minerals  1 tablet Oral Daily   sodium chloride  flush  3 mL Intravenous Q12H   thiamine   100 mg  Oral Daily   Continuous Infusions:  cefTRIAXone  (ROCEPHIN )  IV 1 g (01/10/24 0021)   doxycycline  (VIBRAMYCIN ) IV 100 mg (01/10/24 0012)     LOS: 5 days       Alphonsus Jeans, MD [facility] Pager [phone]  If 7PM-7AM, please contact night-coverag 01/10/2024, 8:44 AM

## 2024-01-11 DIAGNOSIS — I5043 Acute on chronic combined systolic (congestive) and diastolic (congestive) heart failure: Secondary | ICD-10-CM | POA: Diagnosis not present

## 2024-01-11 LAB — CULTURE, BLOOD (ROUTINE X 2)
Culture: NO GROWTH
Culture: NO GROWTH

## 2024-01-11 LAB — PHOSPHORUS: Phosphorus: 3.6 mg/dL (ref 2.5–4.6)

## 2024-01-11 LAB — CBC
HCT: 35.4 % — ABNORMAL LOW (ref 36.0–46.0)
Hemoglobin: 11.6 g/dL — ABNORMAL LOW (ref 12.0–15.0)
MCH: 29.2 pg (ref 26.0–34.0)
MCHC: 32.8 g/dL (ref 30.0–36.0)
MCV: 89.2 fL (ref 80.0–100.0)
Platelets: 316 10*3/uL (ref 150–400)
RBC: 3.97 MIL/uL (ref 3.87–5.11)
RDW: 12.3 % (ref 11.5–15.5)
WBC: 6.9 10*3/uL (ref 4.0–10.5)
nRBC: 0 % (ref 0.0–0.2)

## 2024-01-11 LAB — GLUCOSE, CAPILLARY
Glucose-Capillary: 173 mg/dL — ABNORMAL HIGH (ref 70–99)
Glucose-Capillary: 132 mg/dL — ABNORMAL HIGH (ref 70–99)
Glucose-Capillary: 94 mg/dL (ref 70–99)
Glucose-Capillary: 123 mg/dL — ABNORMAL HIGH (ref 70–99)
Glucose-Capillary: 105 mg/dL — ABNORMAL HIGH (ref 70–99)

## 2024-01-11 LAB — BASIC METABOLIC PANEL WITH GFR
Anion gap: 8 (ref 5–15)
BUN: 19 mg/dL (ref 8–23)
CO2: 24 mmol/L (ref 22–32)
Calcium: 8.8 mg/dL — ABNORMAL LOW (ref 8.9–10.3)
Chloride: 107 mmol/L (ref 98–111)
Creatinine, Ser: 0.68 mg/dL (ref 0.44–1.00)
GFR, Estimated: >60 mL/min (ref >60)
Glucose, Bld: 119 mg/dL — ABNORMAL HIGH (ref 70–99)
Potassium: 4.4 mmol/L (ref 3.5–5.1)
Sodium: 139 mmol/L (ref 135–145)

## 2024-01-11 LAB — MAGNESIUM: Magnesium: 1.8 mg/dL (ref 1.7–2.4)

## 2024-01-11 MED ORDER — CARVEDILOL 3.125 MG PO TABS
3.1250 mg | ORAL_TABLET | Freq: Two times a day (BID) | ORAL | Status: DC
  Administered 2024-01-11 – 2024-01-12 (×2): 3.125 mg via ORAL
  Filled 2024-01-11 (×2): qty 1

## 2024-01-11 MED ORDER — MAGNESIUM SULFATE 2 GM/50ML IV SOLN
2.0000 g | Freq: Once | INTRAVENOUS | Status: AC
  Administered 2024-01-11: 2 g via INTRAVENOUS
  Filled 2024-01-11: qty 50

## 2024-01-11 MED ORDER — DOXYCYCLINE HYCLATE 100 MG PO TABS
100.0000 mg | ORAL_TABLET | Freq: Two times a day (BID) | ORAL | Status: DC
  Administered 2024-01-11 – 2024-01-12 (×3): 100 mg via ORAL
  Filled 2024-01-11 (×3): qty 1

## 2024-01-11 NOTE — Progress Notes (Addendum)
 Pt had 14 beat run of Vtach. Dr. Deena Farrier notified. Will continue to monitor pt.

## 2024-01-11 NOTE — Progress Notes (Signed)
 PHARMACY CONSULT NOTE - FOLLOW UP  Pharmacy Consult for Electrolyte Monitoring and Replacement   Recent Labs: Potassium (mmol/L)  Date Value  01/11/2024 4.4  07/05/2014 3.8   Magnesium  (mg/dL)  Date Value  82/95/6213 1.8   Calcium  (mg/dL)  Date Value  08/65/7846 8.8 (L)   Calcium , Total (mg/dL)  Date Value  96/29/5284 8.6   Albumin (g/dL)  Date Value  13/24/4010 2.4 (L)  07/05/2014 3.7   Phosphorus (mg/dL)  Date Value  27/25/3664 3.6   Sodium (mmol/L)  Date Value  01/11/2024 139  07/05/2014 141     Assessment: 63 y.o. female with cocaine and tobacco abuse, HTN, HLD, DM, COPD not on O2, depression, chronic pain, sickle cell trait who presented with shortness of breath worsening over several days. Pharmacy is asked to follow and replace electrolytes while in CCU. On 5/17, 5 beat run of Vtach. Pt has hx of HFrEF and appears to be euvolemic per MD notes. Pt started on digoxin .   Goal of Therapy:  Electrolytes WNL  Plan:  No replacement needed. Patient transferred from ICU. Electrolytes seem to be stable. Pharmacy will sign off. Please re-consult if needed.   Trinidad Funk ,PharmD Clinical Pharmacist 01/11/2024 7:32 AM

## 2024-01-11 NOTE — TOC Progression Note (Signed)
 Transition of Care [facility]) - Progression Note    Patient Details  Name: Penny Gill MRN: [id number] Date of Birth: 12-Jun-1961  [facility] CM/SW Contact  Gor Vestal C Jazmyne Beauchesne, RN Phone Number: 01/11/2024, 3:13 PM  Clinical Narrative:    Spoke with patient regarding therapy's recommendation. She would like outpatient therapy via [facility] main. Her son will transport her home at discharge. Patient delined RW. She stated she received a RW at her last discharge.          Expected Discharge Plan and Services                                               Social Determinants of Health (SDOH) Interventions SDOH Screenings   Food Insecurity: No Food Insecurity (01/05/2024)  Housing: Low Risk  (01/05/2024)  Transportation Needs: No Transportation Needs (01/05/2024)  Utilities: Not At Risk (01/05/2024)  Depression (PHQ2-9): Low Risk  (01/11/2020)  Financial Resource Strain: Medium Risk (12/22/2023)  Physical Activity: Not on File (09/10/2021)   Received from [place], [place]  Social Connections: Unknown (01/05/2024)  Stress: Not on File (09/10/2021)   Received from [facility], [place]  Tobacco Use: High Risk (01/05/2024)    Readmission Risk Interventions     No data to display

## 2024-01-11 NOTE — Plan of Care (Signed)

## 2024-01-11 NOTE — Progress Notes (Addendum)
 Heart Failure Stewardship Pharmacy Note  PCP: Pcp, No PCP-Cardiologist: Antionette Kirks, MD  HPI: Penny Gill is a 63 y.o. female with cocaine and tobacco abuse, HTN, HLD, DM, COPD not on O2, depression, chronic pain, sickle cell trait who presented with shortness of breath worsening over several days. On admission, BNP was 1852.3, HS-troponin was 235 > 380, and USD was positive for cocaine. Patient has been febrile up to 103.53F with WBC of 14.9. Urinalysis was not a clean catch given squamous epithelial cells present. Blood cultures pending. Chest x-ray noted increasing lung base parenchymal interstitial opacities.   Pertinent cardiac history: Echocardiogram 10/2023 noted LVEF reduced to 30-35% with mild LVH, global hypokinesis, grade I diastolic dysfunction, mild MR. RHC on 12/22/23 showed RA 3, RV 26/3, PCWP 8, LVEDP 10 mmHg, CI 2.1 L/min/m2. LHC 12/22/23 was negative for coronary disease.     Pertinent Lab Values: Creatinine  Date Value Ref Range Status  07/05/2014 1.11 0.60 - 1.30 mg/dL Final   Creatinine, Ser  Date Value Ref Range Status  01/11/2024 0.68 0.44 - 1.00 mg/dL Final   BUN  Date Value Ref Range Status  01/11/2024 19 8 - 23 mg/dL Final  40/98/1191 18 7 - 18 mg/dL Final   Potassium  Date Value Ref Range Status  01/11/2024 4.4 3.5 - 5.1 mmol/L Final  07/05/2014 3.8 3.5 - 5.1 mmol/L Final   Sodium  Date Value Ref Range Status  01/11/2024 139 135 - 145 mmol/L Final  07/05/2014 141 136 - 145 mmol/L Final   B Natriuretic Peptide  Date Value Ref Range Status  01/05/2024 1,852.3 (H) 0.0 - 100.0 pg/mL Final    Comment:    Performed at [facility], [address] [place], [place] [address]   Magnesium   Date Value Ref Range Status  01/11/2024 1.8 1.7 - 2.4 mg/dL Final    Comment:    Performed at [facility], [address] [place], [place] [address]   Hgb A1c MFr Bld  Date Value Ref Range Status  11/15/2023 5.8 (H) 4.8 - 5.6 % Final     Comment:    (NOTE)         Prediabetes: 5.7 - 6.4         Diabetes: >6.4         Glycemic control for adults with diabetes: <7.0    TSH  Date Value Ref Range Status  01/07/2024 1.529 0.350 - 4.500 uIU/mL Final    Comment:    Performed by a 3rd Generation assay with a functional sensitivity of <=0.01 uIU/mL. Performed at [facility], [address] [place], [place] [address]    Vital Signs:  Temp:  [97.9 F (36.6 C)-98.5 F (36.9 C)] 98.1 F (36.7 C) (05/19 0743) Pulse Rate:  [93-98] 96 (05/19 0743) Cardiac Rhythm: Normal sinus rhythm (05/19 0724) Resp:  [14-19] 19 (05/19 0743) BP: (105-138)/(66-98) 105/66 (05/19 0743) SpO2:  [95 %-100 %] 95 % (05/19 0743) Weight:  [37.6 kg (83 lb)] 37.6 kg (83 lb) (05/19 0600)  Intake/Output Summary (Last 24 hours) at 01/11/2024 1043 Last data filed at 01/11/2024 0400 Gross per 24 hour  Intake 450 ml  Output --  Net 450 ml    Current Heart Failure Medications:  Loop diuretic: furosemide  40 mg IV BID Beta-Blocker: carvedilol  3.125 mg BID ACEI/ARB/ARNI: losartan  12.5 mg daily MRA: spironolactone  12.5 mg daily SGLT2i: Farxiga  10 mg daily Other: digoxin  0.125 mg daily  Prior to admission Heart Failure Medications:  Loop  diuretic: furosemide  20 mg prn Beta-Blocker: carvedilol  3.125 mg BID ACEI/ARB/ARNI: losartan  12.5 mg daily MRA: spironolactone  12.5 mg daily SGLT2i: Farxiga  10 mg daily  Assessment: 1. 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 NYHA class IV symptoms.  -Symptoms: Patient reports fatigue and lethargy have resolved and she is feeling back to baseline. Appetite is picking up. -Volume: Appears euvolemic. Creatinine stable. Not currently requiring diuretics. Suspect she will not need scheduled diuretics at discharge, but could consider adjusting PTA furosemide  to prn. -Hemodynamics: BP had trended back up, now stable with resuming spironolactone . HR 80-90s.  Hypovolemic shock resolved.  -BB: PTA carvedilol  held. Cocaine use and COPD noted.  -ACEI/ARB/ARNI: Could consider resuming losartan  12.5 mg daily tomorrow if BP remains stable. -MRA: Continue spironolactone  25 mg daily. -SGLT2i: Continue Farxiga  10 mg daily.  -Consider checking digoxin  level 5 days from start.  Plan: 1) Medication changes recommended at this time: -None.   2) Patient assistance: -Entresto and Farxiga  are $4  3) Education: - Patient has been educated on current HF medications and potential additions to HF medication regimen - Patient verbalizes understanding that over the next few months, these medication doses may change and more medications may be added to optimize HF regimen - Patient has been educated on basic disease state pathophysiology and goals of therapy  Medication Assistance / Insurance Benefits Check: Does the patient have prescription insurance?    Type of insurance plan:  Does the patient qualify for medication assistance through manufacturers or grants? Pending   Outpatient Pharmacy: Prior to admission outpatient pharmacy: [facility]     Please do not hesitate to reach out with questions or concerns,  Bevely Brush, PharmD, CPP, BCPS Heart Failure Pharmacist  Phone - [phone] 01/11/2024 10:43 AM

## 2024-01-11 NOTE — Progress Notes (Addendum)
 Heart Failure Nurse Navigator Progress Note  PCP: Pcp, No PCP-Cardiologist: Antionette Kirks, MD Admission Diagnosis: Acute on chronic HFrEF (Heart failure with reduced ejection fraction) [facility]) Admitted from: Home via [facility]  Presentation:   Penny Gill presented with 2 days of increasing shortness of breath worsening again.  No fever or chills.  Also she had noticed swelling in both of her legs very similar to when she was last hospitalized.  She does not have any sort of cough but was wheezing earlier.  Symptoms got better after EMS gave her Solu-medrol  and 2 DuoNebs. Patient says she is compliant with medications. BJY-7,829.3.  Urine drug screen positive for Cocaine. Chest x-ray Increasing lung base parenchymal interstitial opacities. Acute infiltrate is possible.  ECHO/ LVEF: 30-35% 11/15/2023,  25-30% -LV severely decreased function and global hypokinesis.01/09/2024 with bubble negative  Clinical Course:  Past Medical History:  Diagnosis Date   COPD (chronic obstructive pulmonary disease) (HCC)    emphysema   Depression    Hyperlipidemia    Hypertension    Kidney stone    Osteoporosis    Sickle cell trait (HCC)      Social History   Socioeconomic History   Marital status: Married    Spouse name: Not on file   Number of children: 2   Years of education: Not on file   Highest education level: Some college, no degree  Occupational History   Not on file  Tobacco Use   Smoking status: Every Day    Current packs/day: 0.20    Types: Cigarettes, E-cigarettes   Smokeless tobacco: Never   Tobacco comments:    I vape every now and then  Vaping Use   Vaping status: Never Used  Substance and Sexual Activity   Alcohol use: Not Currently    Comment: rare   Drug use: Not Currently    Types: Marijuana, Cocaine    Comment: History of cocaine abuse   Sexual activity: Not on file  Other Topics Concern   Not on file  Social History Narrative   Not on file   Social Drivers of  Health   Financial Resource Strain: Medium Risk (12/22/2023)   Overall Financial Resource Strain (CARDIA)    Difficulty of Paying Living Expenses: Somewhat hard  Food Insecurity: No Food Insecurity (01/05/2024)   Hunger Vital Sign    Worried About Running Out of Food in the Last Year: Never true    Ran Out of Food in the Last Year: Never true  Transportation Needs: No Transportation Needs (01/05/2024)   PRAPARE - Administrator, Civil Service (Medical): No    Lack of Transportation (Non-Medical): No  Physical Activity: Not on File (09/10/2021)   Received from [place], [place]   Physical Activity    Physical Activity: 0  Stress: Not on File (09/10/2021)   Received from [facility], [place]   Stress    Stress: 0  Social Connections: Unknown (01/05/2024)   Social Connection and Isolation Panel [NHANES]    Frequency of Communication with Friends and Family: Not on file    Frequency of Social Gatherings with Friends and Family: Not on file    Attends Religious Services: Not on file    Active Member of Clubs or Organizations: Not on file    Attends Banker Meetings: Not on file    Marital Status: Separated   Education Assessment and Provision:  Detailed education and instructions provided on heart failure disease management including the following:  Signs and  symptoms of Heart Failure When to call the physician Importance of daily weights Low sodium diet Fluid restriction Medication management Anticipated future follow-up appointments  Patient education given on each of the above topics.  Patient acknowledges understanding via teach back method and acceptance of all instructions.  Education Materials:  "Living Better With Heart Failure" Booklet, HF zone tool, & Daily Weight Tracker Tool.  Patient has scale at home: Yes Patient has pill box at home: No. Patient was provided with one for discharge.  High Risk Criteria for Readmission and/or Poor Patient Outcomes: Heart  failure hospital admissions (last 6 months): 2  No Show rate: 4 Difficult social situation: Drug & Smoking Cessation Demonstrates medication adherence: Yes Primary Language: English Literacy level: Reading, Writing & Comprehension  Barriers of Care:   None  Considerations/Referrals:   Referral made to Heart Failure Pharmacist Stewardship: Yes Referral made to Heart Failure CSW/NCM TOC: No Referral made to Heart & Vascular TOC clinic: Yes. Patient had a follow-up  from her first [facility] appointment in place.  A new hospital follow-up scheduled for 01/19/24 @ 1:30 PM.  Patient aware of appointment details and will set up her own transportation.   Items for Follow-up on DC/TOC: Diet & Fluid Restrictions Daily Weights Drug & Smoking Cessation Continued Heart Failure Medication    Penny Coil, RN, BSN [facility] Heart Failure Navigator Secure Chat Only

## 2024-01-11 NOTE — Progress Notes (Signed)
 Rounding Note    Patient Name: Penny Gill Date of Encounter: 01/11/2024  [facility] Cardiologist: Antionette Kirks, MD   Subjective   Patient reports feeling much better today. She is without symptoms of chest pain, shortness of breath, and palpitations. She has been up and walking without symptoms. Telemetry shows sinus rhythm with some runs of NSVT up to 14 beats.   Inpatient Medications    Scheduled Meds:  aspirin  EC  81 mg Oral Daily   atorvastatin   40 mg Oral QHS   Chlorhexidine  Gluconate Cloth  6 each Topical Daily   dapagliflozin  propanediol  10 mg Oral Daily   digoxin   0.125 mg Oral Daily   doxycycline   100 mg Oral Q12H   DULoxetine   30 mg Oral Daily   enoxaparin  (LOVENOX ) injection  30 mg Subcutaneous Q24H   feeding supplement  237 mL Oral TID BM   folic acid   1 mg Oral Daily   insulin  aspart  0-15 Units Subcutaneous Q4H   multivitamin with minerals  1 tablet Oral Daily   sodium chloride  flush  3 mL Intravenous Q12H   spironolactone   25 mg Oral Daily   thiamine   100 mg Oral Daily   Continuous Infusions:  cefTRIAXone  (ROCEPHIN )  IV 1 g (01/10/24 2329)   magnesium  sulfate bolus IVPB     PRN Meds: acetaminophen , ipratropium-albuterol , ondansetron  (ZOFRAN ) IV, sodium chloride  flush, traZODone    Vital Signs    Vitals:   01/11/24 0056 01/11/24 0502 01/11/24 0600 01/11/24 0743  BP: (!) 118/94 115/84  105/66  Pulse: 98 93  96  Resp: 16   19  Temp: 97.9 F (36.6 C) 98.1 F (36.7 C)  98.1 F (36.7 C)  TempSrc: Oral     SpO2: 97% 100%  95%  Weight:   37.6 kg   Height:        Intake/Output Summary (Last 24 hours) at 01/11/2024 0852 Last data filed at 01/11/2024 0400 Gross per 24 hour  Intake 450 ml  Output --  Net 450 ml      01/11/2024    6:00 AM 01/09/2024    3:50 AM 01/08/2024    3:41 AM  Last 3 Weights  Weight (lbs) 83 lb 74 lb 8.3 oz 74 lb 8.3 oz  Weight (kg) 37.649 kg 33.8 kg 33.8 kg      Telemetry    Sinus rhythm rate 80-110  bpm with a few runs of NSVT up to 14 beats - Personally Reviewed  Physical Exam   GEN: Frail appearing. No acute distress.   Neck: No JVD Cardiac: RRR, no murmurs, rubs, or gallops.  Respiratory: Clear to auscultation bilaterally. GI: Soft, nontender, non-distended  MS: No edema; No deformity. Neuro:  Nonfocal  Psych: Normal affect   Labs    High Sensitivity Troponin:   Recent Labs  Lab 01/05/24 1818 01/07/24 1958 01/07/24 2207 01/08/24 0635 01/08/24 0930  TROPONINIHS 380* 135* 1,015* 4,247* 3,994*     Chemistry Recent Labs  Lab 01/07/24 1958 01/08/24 0635 01/09/24 0431 01/10/24 0434 01/11/24 0425  NA 135 140 139 141 139  K 4.5 4.2 3.8 4.0 4.4  CL 104 105 108 108 107  CO2 18* 25 24 24 24   GLUCOSE 292* 122* 132* 98 119*  BUN 32* 31* 19 21 19   CREATININE 1.41* 1.09* 0.81 0.69 0.68  CALCIUM  7.9* 8.3* 8.4* 8.5* 8.8*  MG 2.1  --  1.6* 2.0 1.8  PROT 5.4* 5.9*  --  5.9*  --  ALBUMIN 2.4* 2.6*  --  2.4*  --   AST 677* 289*  --  40  --   ALT 321* 306*  --  120*  --   ALKPHOS 269* 261*  --  179*  --   BILITOT 1.0 0.7  --  0.6  --   GFRNONAA 42* 57* >60 >60 >60  ANIONGAP 13 10 7 9 8     Lipids No results for input(s): "CHOL", "TRIG", "HDL", "LABVLDL", "LDLCALC", "CHOLHDL" in the last 168 hours.  Hematology Recent Labs  Lab 01/09/24 0431 01/10/24 0434 01/11/24 0425  WBC 7.7 7.4 6.9  RBC 3.70* 3.93 3.97  HGB 11.0* 11.6* 11.6*  HCT 33.6* 34.8* 35.4*  MCV 90.8 88.5 89.2  MCH 29.7 29.5 29.2  MCHC 32.7 33.3 32.8  RDW 12.4 12.2 12.3  PLT 253 299 316   Thyroid   Recent Labs  Lab 01/07/24 2207  TSH 1.529    BNP Recent Labs  Lab 01/05/24 1438  BNP 1,852.3*    DDimer  Recent Labs  Lab 01/07/24 1958  DDIMER 1.79*    Cardiac Studies   TTE 01/09/2024  1. Left ventricular ejection fraction, by estimation, is 25 to 30%. The  left ventricle has severely decreased function. The left ventricle  demonstrates global hypokinesis. The left ventricular internal  cavity size  was mildly dilated. Left ventricular  diastolic function could not be evaluated.   2. Right ventricular systolic function is mildly reduced. The right  ventricular size is normal. There is mildly elevated pulmonary artery  systolic pressure. The estimated right ventricular systolic pressure is  41.4 mmHg.   3. Left atrial size was severely dilated.   4. PFO is present by color doppler but bubble is negative. Suspect  elevated LAP is reason fro negative bubble. Agitated saline contrast  bubble study was negative, with no evidence of any interatrial shunt.  There is a small patent foramen ovale with  predominantly left to right shunting across the atrial septum.   5. Right atrial size was mildly dilated.   6. Severe MR is present due to restricted PMVL from cardiomyopathy. The  mitral valve is abnormal. Severe mitral valve regurgitation. No evidence  of mitral stenosis.   7. The aortic valve is tricuspid. Aortic valve regurgitation is not  visualized. No aortic stenosis is present.   8. The inferior vena cava is normal in size with greater than 50%  respiratory variability, suggesting right atrial pressure of 3 mmHg.    LHC/RHC 12/22/2023 No angiographically significant coronary artery disease.  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 Normal left and right heart filling pressures (RA 3, RV 26/3, PCWP 8, LVEDP 10 mmHg). Upper normal pulmonary artery pressure with elevated vascular resistance (PA 27/14, mean 18 mmHg; PVR 4 WU). Mildly reduced Fick cardiac index (CO 2.5 L/min, CI 2.1 L/min/m^2).  Patient Profile     63 y.o. female with history of cocaine abuse, systolic heart failure, diabetes, hypertension, COPD admitted on 01/05/2024 with acute on chronic systolic heart failure. Course complicated by hypotension and shock secondary to hypovolemia and diarrhea. Cardiology was consulted for further management.   Assessment & Plan    Acute on chronic systolic  heart failure, EF 25-30% Severe mitral valve regurgitation Hypovolemic shock, resolved - Initially admitted with acute heart failure.  Underwent diuresis and had diarrhea.  Suffered from shock which was attributed to hypovolemia.  She was briefly on pressors in the ICU and her overall condition has now improved.  Evidence of AKI  and acute liver injury which have both resolved now. - Blood pressure is improved, no signs of cardiogenic shock - Nonischemic cardiomyopathy with prior left heart cath showing normal coronaries.  Likely related to cocaine abuse. 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  The posterior mitral valve leaflet is restricted. - Recommend medical management at this time with ongoing cocaine abuse, she is not a candidate for advanced therapies - Appears euvolemic on exam, no indication for further diuresis at this time - Continue Farxiga , spironolactone , and digoxin  - Will need to check digoxin  level later this week - Consider addition of Entresto or ARB if BP allows, will continue to monitor - Consider restarting PTA carvedilol , defer at this time given soft pressures - Noted to have sinus tachycardia concerning for low output, continue digoxin    NSVT - A few runs of NSVT up to 14 beats noted on telemetry this morning, patient asymptomatic - Recent cath with clear coronary arteries - Continue to monitor  Possible PNA - Antibiotics per IM  AKI Acute liver injury - Improving, continue to monitor  Elevated troponin - Demand ischemia in the setting of shock and acute on chronic HF - Recent cath with normal coronary arteries - No plan for further ischemic evaluation at this time  Cocaine abuse - Continue to recommend complete cessation  For questions or updates, please contact [facility] Please consult [email or web address] for contact info under        Signed, Brodie Cannon, PA-C   01/11/2024, 8:52 AM

## 2024-01-11 NOTE — Progress Notes (Signed)
 PROGRESS NOTE    LUGENIA ASSEFA  ZOX:[id number] DOB: [birth year] DOA: 01/05/2024 PCP: Pcp, No   Assessment & Plan:   Principal Problem:   Acute on chronic HFrEF (heart failure with reduced ejection fraction) (HCC) Active Problems:   Shock (HCC)  Assessment and Plan:  Acute on chronic combined CHF: restart coreg , aldactone , farxiga . Holding losartan , lasix . D/c midodrine  as per cardio. Monitor I/Os. 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 Continue on digoxin .  Severe MR: management as per cario   Acute hypoxic respiratory failure: initial oxygen saturation was 88% on 2 L of oxygen. Weaned off of supplemental oxygen. Resolved  Possible CAP: completed abx course. Continue on bronchodilators & encourage incentive                                                                                                                                                                               Hypomagnesemia: mag sulfate given   Hypophosphatemia: WNL today   Hypotension: likely secondary to circulatory shock. See NP Rust-Chester on how pt met circulatory shock criteria. Weaned off of pressors. Keep MAP >65. Resolved  Hypokalemia: WNL today   Elevated troponins: likely secondary to demand ischemia & cocaine use. No chest pain currently. Right and left heart cath in March 2025 showed normal coronaries. Cardio following and recs apprec   Cocaine abuse: urine drug screen was positive for cocaine. Received cessation counseling already.  Transaminitis: etiology unclear, likely secondary to cocaine abuse. AST is WNL and ALT is still elevated but trending down   Diarrhea: started on 01/07/24. GI PCR panel was neg. Resolved    Fever: likely secondary to CAP. Blood cxs NGTD. UA showed large leukocytes but rare bacteria. No fever > 72 hrs. Resolved  COPD: w/o exacerbation. Bronchodilators prn        DVT prophylaxis: lovenox  Code Status: full   Family Communication:  Disposition Plan: likely d/c back home  Status is: Inpatient Remains inpatient appropriate because: severity of illness    Level of care: Progressive Consultants:  ICU cardio  Procedures:    Antimicrobials:   Subjective: Pt c/o fatigue   Objective: Vitals:   01/11/24 0056 01/11/24 0502 01/11/24 0600 01/11/24 0743  BP: (!) 118/94 115/84  105/66  Pulse: 98 93  96  Resp: 16   19  Temp: 97.9 F (36.6 C) 98.1 F (36.7 C)  98.1 F (36.7 C)  TempSrc: Oral     SpO2: 97% 100%  95%  Weight:   37.6 kg   Height:        Intake/Output Summary (Last 24 hours) at 01/11/2024 0847 Last data filed at 01/11/2024 0400 Gross per 24 hour  Intake 450 ml  Output --  Net 450 ml   Filed Weights   01/08/24 0341 01/09/24 0350 01/11/24 0600  Weight: 33.8 kg 33.8 kg 37.6 kg    Examination:  General exam: appears calm & comfortable  Respiratory system: decreased breath sounds b/l Cardiovascular system: S1 & S2+. No rubs or clicks  Gastrointestinal system:  abd is soft, NT, ND & hypoactive bowel sounds Central nervous system: alert & awake. Moves all extremities  Psychiatry: judgement and insight appears at baseline. Flat mood and affect   Data Reviewed: I have personally reviewed following labs and imaging studies  CBC: Recent Labs  Lab 01/07/24 1958 01/08/24 0635 01/09/24 0431 01/10/24 0434 01/11/24 0425  WBC 6.9 13.4* 7.7 7.4 6.9  NEUTROABS 4.9  --   --   --   --   HGB 11.0* 11.2* 11.0* 11.6* 11.6*  HCT 34.3* 34.2* 33.6* 34.8* 35.4*  MCV 90.5 91.7 90.8 88.5 89.2  PLT 237 279 253 299 316   Basic Metabolic Panel: Recent Labs  Lab 01/06/24 0338 01/07/24 0445 01/07/24 1958 01/08/24 0635 01/09/24 0431 01/10/24 0434 01/11/24 0425  NA 142   < > 135 140 139 141 139  K 3.2*   < > 4.5 4.2 3.8 4.0 4.4  CL 108   < > 104 105 108 108 107  CO2 25   < > 18* 25 24 24 24   GLUCOSE 113*   < > 292* 122* 132* 98 119*  BUN 24*   < > 32* 31* 19 21 19    CREATININE 1.14*   < > 1.41* 1.09* 0.81 0.69 0.68  CALCIUM  8.7*   < > 7.9* 8.3* 8.4* 8.5* 8.8*  MG 1.7  --  2.1  --  1.6* 2.0 1.8  PHOS  --   --  4.3  --  2.0* 3.4 3.6   < > = values in this interval not displayed.   GFR: Estimated Creatinine Clearance: 43.3 mL/min (by C-G formula based on SCr of 0.68 mg/dL). Liver Function Tests: Recent Labs  Lab 01/07/24 1958 01/08/24 0635 01/10/24 0434  AST 677* 289* 40  ALT 321* 306* 120*  ALKPHOS 269* 261* 179*  BILITOT 1.0 0.7 0.6  PROT 5.4* 5.9* 5.9*  ALBUMIN 2.4* 2.6* 2.4*   Recent Labs  Lab 01/07/24 1958  LIPASE 31   No results for input(s): "AMMONIA" in the last 168 hours. Coagulation Profile: No results for input(s): "INR", "PROTIME" in the last 168 hours. Cardiac Enzymes: No results for input(s): "CKTOTAL", "CKMB", "CKMBINDEX", "TROPONINI" in the last 168 hours. BNP (last 3 results) No results for input(s): "PROBNP" in the last 8760 hours. HbA1C: No results for input(s): "HGBA1C" in the last 72 hours. CBG: Recent Labs  Lab 01/10/24 1211 01/10/24 1604 01/10/24 2040 01/11/24 0557 01/11/24 0739  GLUCAP 222* 153* 137* 173* 132*   Lipid Profile: No results for input(s): "CHOL", "HDL", "LDLCALC", "TRIG", "CHOLHDL", "LDLDIRECT" in the last 72 hours. Thyroid  Function Tests: No results for input(s): "TSH", "T4TOTAL", "FREET4", "T3FREE", "THYROIDAB" in the last 72 hours.  Anemia Panel: No results for input(s): "VITAMINB12", "FOLATE", "FERRITIN", "TIBC", "IRON", "RETICCTPCT" in the last 72 hours. Sepsis Labs: Recent Labs  Lab 01/05/24 1438 01/07/24 2207 01/08/24 0016  PROCALCITON 0.10  --   --   LATICACIDVEN  --  1.1 0.8    Recent Results (from the past 240 hours)  Culture, blood (Routine X 2) w Reflex to ID Panel     Status: None   Collection Time:  01/05/24 11:46 PM   Specimen: BLOOD  Result Value Ref Range Status   Specimen Description BLOOD RIGHT FA  Final   Special Requests   Final    BOTTLES DRAWN AEROBIC  AND ANAEROBIC Blood Culture results may not be optimal due to an inadequate volume of blood received in culture bottles   Culture   Final    NO GROWTH 5 DAYS Performed at [facility], [address] [place], [place] [address]    Report Status 01/11/2024 FINAL  Final  Culture, blood (Routine X 2) w Reflex to ID Panel     Status: None   Collection Time: 01/05/24 11:46 PM   Specimen: BLOOD  Result Value Ref Range Status   Specimen Description BLOOD LEFT FA  Final   Special Requests   Final    BOTTLES DRAWN AEROBIC AND ANAEROBIC Blood Culture results may not be optimal due to an inadequate volume of blood received in culture bottles   Culture   Final    NO GROWTH 5 DAYS Performed at [facility], [address], [place], [place] [address]    Report Status 01/11/2024 FINAL  Final  MRSA Next Gen by PCR, Nasal     Status: None   Collection Time: 01/07/24  8:20 PM   Specimen: Nasal Mucosa; Nasal Swab  Result Value Ref Range Status   MRSA by PCR Next Gen NOT DETECTED NOT DETECTED Final    Comment: (NOTE) The GeneXpert MRSA Assay (FDA approved for NASAL specimens only), is one component of a comprehensive MRSA colonization surveillance program. It is not intended to diagnose MRSA infection nor to guide or monitor treatment for MRSA infections. Test performance is not FDA approved in patients less than 37 years old. Performed at [facility], [address] [place], [place] [address]   Gastrointestinal Panel by PCR , Stool     Status: None   Collection Time: 01/08/24 11:12 AM   Specimen: Stool  Result Value Ref Range Status   Campylobacter species NOT DETECTED NOT DETECTED Final   Plesimonas shigelloides NOT DETECTED NOT DETECTED Final   Salmonella species NOT DETECTED NOT DETECTED Final   Yersinia enterocolitica NOT DETECTED NOT DETECTED Final   Vibrio species NOT DETECTED NOT DETECTED Final   Vibrio cholerae NOT DETECTED NOT DETECTED Final    Enteroaggregative E coli (EAEC) NOT DETECTED NOT DETECTED Final   Enteropathogenic E coli (EPEC) NOT DETECTED NOT DETECTED Final   Enterotoxigenic E coli (ETEC) NOT DETECTED NOT DETECTED Final   Shiga like toxin producing E coli (STEC) NOT DETECTED NOT DETECTED Final   Shigella/Enteroinvasive E coli (EIEC) NOT DETECTED NOT DETECTED Final   Cryptosporidium NOT DETECTED NOT DETECTED Final   Cyclospora cayetanensis NOT DETECTED NOT DETECTED Final   Entamoeba histolytica NOT DETECTED NOT DETECTED Final   Giardia lamblia NOT DETECTED NOT DETECTED Final   Adenovirus F40/41 NOT DETECTED NOT DETECTED Final   Astrovirus NOT DETECTED NOT DETECTED Final   Norovirus GI/GII NOT DETECTED NOT DETECTED Final   Rotavirus A NOT DETECTED NOT DETECTED Final   Sapovirus (I, II, IV, and V) NOT DETECTED NOT DETECTED Final    Comment: Performed at [facility], [address], [place], [place] [address]         Radiology Studies: ECHOCARDIOGRAM COMPLETE BUBBLE STUDY Result Date: 01/09/2024    ECHOCARDIOGRAM REPORT   Patient Name:   DEVENEY BAYON Date of Exam: 01/09/2024 Medical Rec #:  [id number]  Height:       60.0 in Accession #:    [id number]      Weight:       74.5 lb Date of Birth:  Feb 10, 1961       BSA:          1.227 m Patient Age:    63 years        BP:           118/82 mmHg Patient Gender: F               HR:           90 bpm. Exam Location:  [facility] Procedure: 2D Echo, Cardiac Doppler, Color Doppler, Strain Analysis and Saline            Contrast Bubble Study (Both Spectral and Color Flow Doppler were            utilized during procedure). Indications:     Murmur R01.1  History:         Patient has prior history of Echocardiogram examinations, most                  recent 11/15/2023.  Sonographer:     Clenton Czech DCS, FASE Referring Phys:  [id number] BRENDA MORRISON Diagnosing Phys: Jackquelyn Mass MD  Sonographer Comments: Global longitudinal strain was attempted. IMPRESSIONS  1. Left  ventricular ejection fraction, by estimation, is 25 to 30%. The left ventricle has severely decreased function. The left ventricle demonstrates global hypokinesis. The left ventricular internal cavity size was mildly dilated. Left ventricular diastolic function could not be evaluated.  2. Right ventricular systolic function is mildly reduced. The right ventricular size is normal. There is mildly elevated pulmonary artery systolic pressure. The estimated right ventricular systolic pressure is 41.4 mmHg.  3. Left atrial size was severely dilated.  4. PFO is present by color doppler but bubble is negative. Suspect elevated LAP is reason fro negative bubble. Agitated saline contrast bubble study was negative, with no evidence of any interatrial shunt. There is a small patent foramen ovale with predominantly left to right shunting across the atrial septum.  5. Right atrial size was mildly dilated.  6. Severe MR is present due to restricted PMVL from cardiomyopathy. The mitral valve is abnormal. Severe mitral valve regurgitation. No evidence of mitral stenosis.  7. The aortic valve is tricuspid. Aortic valve regurgitation is not visualized. No aortic stenosis is present.  8. The inferior vena cava is normal in size with greater than 50% respiratory variability, suggesting right atrial pressure of 3 mmHg. Comparison(s): Changes from prior study are noted. LVEF 25-30%. Severe MR now present, appears functional due to restricted PMVL. FINDINGS  Left Ventricle: Left ventricular ejection fraction, by estimation, is 25 to 30%. The left ventricle has severely decreased function. The left ventricle demonstrates global hypokinesis. The left ventricular internal cavity size was mildly dilated. There is no left ventricular hypertrophy. Left ventricular diastolic function could not be evaluated due to mitral regurgitation (moderate or greater). Left ventricular diastolic function could not be evaluated. Right Ventricle: The right  ventricular size is normal. No increase in right ventricular wall thickness. Right ventricular systolic function is mildly reduced. There is mildly elevated pulmonary artery systolic pressure. The tricuspid regurgitant velocity  is 3.10 m/s, and with an assumed right atrial pressure of 3 mmHg, the estimated right ventricular systolic pressure is 41.4 mmHg. Left Atrium: Left atrial size was severely dilated. Right Atrium: Right atrial size was mildly dilated.  Pericardium: There is no evidence of pericardial effusion. Mitral Valve: Severe MR is present due to restricted PMVL from cardiomyopathy. The mitral valve is abnormal. Severe mitral valve regurgitation. No evidence of mitral valve stenosis. MV peak gradient, 9.4 mmHg. The mean mitral valve gradient is 4.0 mmHg. Tricuspid Valve: The tricuspid valve is grossly normal. Tricuspid valve regurgitation is mild . No evidence of tricuspid stenosis. Aortic Valve: The aortic valve is tricuspid. Aortic valve regurgitation is not visualized. No aortic stenosis is present. Aortic valve peak gradient measures 4.4 mmHg. Pulmonic Valve: The pulmonic valve was grossly normal. Pulmonic valve regurgitation is mild. No evidence of pulmonic stenosis. Aorta: The aortic root and ascending aorta are structurally normal, with no evidence of dilitation. Venous: The inferior vena cava is normal in size with greater than 50% respiratory variability, suggesting right atrial pressure of 3 mmHg. IAS/Shunts: The interatrial septum is aneurysmal. The atrial septum is grossly normal. Agitated saline contrast was given intravenously to evaluate for intracardiac shunting. Agitated saline contrast bubble study was negative, with no evidence of any interatrial shunt. A small patent foramen ovale is detected with predominantly left to right shunting across the atrial septum.  LEFT VENTRICLE PLAX 2D LVIDd:         6.10 cm      Diastology LVIDs:         5.20 cm      LV e' medial:    7.40 cm/s LV PW:          1.00 cm      LV E/e' medial:  13.3 LV IVS:        0.70 cm      LV e' lateral:   11.10 cm/s LVOT diam:     1.80 cm      LV E/e' lateral: 8.9 LV SV:         29 LV SV Index:   23 LVOT Area:     2.54 cm  LV Volumes (MOD) LV vol d, MOD A2C: 114.0 ml LV vol d, MOD A4C: 145.0 ml LV vol s, MOD A2C: 77.0 ml LV vol s, MOD A4C: 96.0 ml LV SV MOD A2C:     37.0 ml LV SV MOD A4C:     145.0 ml LV SV MOD BP:      42.4 ml RIGHT VENTRICLE RV Basal diam:  2.40 cm RV S prime:     10.80 cm/s TAPSE (M-mode): 1.3 cm LEFT ATRIUM           Index        RIGHT ATRIUM           Index LA diam:      4.10 cm 3.34 cm/m   RA Area:     11.50 cm LA Vol (A2C): 66.2 ml 53.96 ml/m  RA Volume:   28.70 ml  23.39 ml/m LA Vol (A4C): 44.1 ml 35.94 ml/m  AORTIC VALVE                 PULMONIC VALVE AV Area (Vmax): 1.88 cm     PV Vmax:          0.91 m/s AV Vmax:        105.00 cm/s  PV Peak grad:     3.3 mmHg AV Peak Grad:   4.4 mmHg     PR End Diast Vel: 12.96 msec LVOT Vmax:      77.40 cm/s LVOT Vmean:     49.100 cm/s LVOT VTI:  0.112 m  AORTA Ao Root diam: 3.30 cm Ao Asc diam:  3.00 cm MITRAL VALVE                  TRICUSPID VALVE MV Area (PHT): 4.60 cm       TR Peak grad:   38.4 mmHg MV Area VTI:   0.84 cm       TR Vmax:        310.00 cm/s MV Peak grad:  9.4 mmHg MV Mean grad:  4.0 mmHg       SHUNTS MV Vmax:       1.53 m/s       Systemic VTI:  0.11 m MV Vmean:      91.4 cm/s      Systemic Diam: 1.80 cm MV Decel Time: 165 msec MR Peak grad:    122.3 mmHg MR Mean grad:    76.0 mmHg MR Vmax:         553.00 cm/s MR Vmean:        402.0 cm/s MR PISA:         2.65 cm MR PISA Eff ROA: 15 mm MR PISA Radius:  0.65 cm MV E velocity: 98.50 cm/s MV A velocity: 65.50 cm/s MV E/A ratio:  1.50 Jackquelyn Mass MD Electronically signed by Jackquelyn Mass MD Signature Date/Time: 01/09/2024/2:23:54 PM    Final         Scheduled Meds:  aspirin  EC  81 mg Oral Daily   atorvastatin   40 mg Oral QHS   Chlorhexidine  Gluconate Cloth  6 each Topical Daily    dapagliflozin  propanediol  10 mg Oral Daily   digoxin   0.125 mg Oral Daily   doxycycline   100 mg Oral Q12H   DULoxetine   30 mg Oral Daily   enoxaparin  (LOVENOX ) injection  30 mg Subcutaneous Q24H   feeding supplement  237 mL Oral TID BM   folic acid   1 mg Oral Daily   insulin  aspart  0-15 Units Subcutaneous Q4H   multivitamin with minerals  1 tablet Oral Daily   sodium chloride  flush  3 mL Intravenous Q12H   spironolactone   25 mg Oral Daily   thiamine   100 mg Oral Daily   Continuous Infusions:  cefTRIAXone  (ROCEPHIN )  IV 1 g (01/10/24 2329)     LOS: 6 days       Alphonsus Jeans, MD [facility] Pager [phone]  If 7PM-7AM, please contact night-coverag 01/11/2024, 8:47 AM

## 2024-01-11 NOTE — Evaluation (Signed)
 Occupational Therapy Evaluation Patient Details Name: Penny Gill MRN: [id number] DOB: 02-23-1961 Today's Date: 01/11/2024   History of Present Illness   Pt is a 63 y.o. female presenting to hospital 01/05/24 with c/o SOB, leg swelling, and weight gain.  Pt admitted with acute on chronic HFrEF and acute hypoxic respiratory failure.  Transfer to CCU 01/07/24 d/t pt hypotensive and minimally responsive.  PMH includes chronic HFrEF, COPD, cocaine use disorder, htn, HLD.     Clinical Impressions Pt was seen for OT evaluation this date. PTA, pt reports IND with all mobility and ADLs. She has a RW she uses if dizzy/lightheaded. Has had some falls a few weeks ago. She does not appear to have any functional decline in her ADL performance. She admits to urine incontinence at times which she manages with use of pull-ups. 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 OT edu on compensatory strategies to prevent injury, as well as AE/AD/DME to utilize for safety/ease with tasks. Provided handouts with education on hand strengthening and FMC/dexerity activities that may improve some of her [facility] deficits. Pt is thankful of all information. She demo bed mobility IND/MOD I, STS and mobility in the room with supervision this date without use of AD. Able to perform all aspects of toileting, oral care, and hand hygiene with no issues with supervision overall. OT to sign off in house with no further OT needs on DC.     If plan is discharge home, recommend the following:         Functional Status Assessment   Patient has not had a recent decline in their functional status     Equipment Recommendations   Other (comment) (RW, BSC)     Recommendations for Other Services         Precautions/Restrictions   Precautions Precautions: Fall Recall of Precautions/Restrictions: Intact Restrictions Weight Bearing Restrictions Per Provider Order: No      Mobility Bed Mobility Overal bed mobility: Modified Independent                  Transfers Overall transfer level: Independent Equipment used: None Transfers: Sit to/from Stand Sit to Stand: Independent           General transfer comment: steady with all transfers and did not utilize RW during session      Balance     Sitting balance-Leahy Scale: Normal     Standing balance support: During functional activity, No upper extremity supported Standing balance-Leahy Scale: Good Standing balance comment: did not use RW during OT session, grabbed to door x1, held to counter during oral care all with SUP                           ADL either performed or assessed with clinical judgement   ADL Overall ADL's : Needs assistance/impaired     Grooming: Wash/dry hands;Standing;Supervision/safety;Oral care                   Toilet Transfer: Supervision/safety   Toileting- Architect and Hygiene: Careers adviser         Pertinent Vitals/Pain Pain Assessment Pain Assessment: No/denies pain Pain Intervention(s): Monitored during session     Extremity/Trunk Assessment Upper Extremity Assessment  Upper Extremity Assessment: Overall WFL for tasks assessed   Lower Extremity Assessment Lower Extremity Assessment: Generalized weakness   Cervical / Trunk Assessment Cervical / Trunk Assessment: Normal   Communication Communication Communication: No apparent difficulties   Cognition Arousal: Alert Behavior During Therapy: WFL for tasks assessed/performed                                 Following commands: Intact       Cueing  General Comments   Cueing Techniques: Verbal cues      Exercises Other Exercises Other Exercises: Edu on role of OT and   Shoulder Instructions      Home Living Family/patient expects to be discharged to:: Private  residence Living Arrangements: Alone (Apt downstairs in care home) Available Help at Discharge: Friend(s);Available PRN/intermittently Type of Home: House Home Access: Ramped entrance     Home Layout: Two level (Lives in downstairs apt) Alternate Level Stairs-Number of Steps: 12 steps with R railing down to apt             Home Equipment: Agricultural consultant (2 wheels)   Additional Comments: reports living with her former boss since it is free right now      Prior Functioning/Environment Prior Level of Function : History of Falls (last six months)             Mobility Comments: Independent with ambulation; pt reports using RW when lightheaded/dizzy; last fall 3-4 weeks ago. ADLs Comments: IND, was cooking, cleaning, etc. reports some difficulty d/t her neuropathy    OT Problem List:     OT Treatment/Interventions:        OT Goals(Current goals can be found in the care plan section)       OT Frequency:       Co-evaluation              AM-PAC OT "6 Clicks" Daily Activity     Outcome Measure Help from another person eating meals?: None Help from another person taking care of personal grooming?: None Help from another person toileting, which includes using toliet, bedpan, or urinal?: None Help from another person bathing (including washing, rinsing, drying)?: None Help from another person to put on and taking off regular upper body clothing?: None Help from another person to put on and taking off regular lower body clothing?: None 6 Click Score: 24   End of Session Nurse Communication: Mobility status  Activity Tolerance: Patient tolerated treatment well Patient left: in bed;with call bell/phone within reach;with bed alarm set  OT Visit Diagnosis: Other abnormalities of gait and mobility (R26.89)                Time: 1610-9604 OT Time Calculation (min): 20 min Charges:  OT General Charges $OT Visit: 1 Visit OT Evaluation $OT Eval Low Complexity: 1  Low  Melane Windholz, OTR/L 01/11/24, 1:37 PM  Percilla Tweten E Hart Haas 01/11/2024, 1:33 PM

## 2024-01-11 NOTE — Progress Notes (Incomplete)
 Pt c/o pain 7/10 in right chest. She states the pain started suddenly. BP left arm 136/107, map 114. BP right arm 137/103, map 114. Pulse 102. Dr. Broadus Canes and Dr. Alvenia Aus notified. Will continue to monitor pt.

## 2024-01-11 NOTE — Evaluation (Signed)
 Physical Therapy Re-Evaluation Patient Details Name: Penny Gill MRN: [id number] DOB: Mar 13, 1961 Today's Date: 01/11/2024  History of Present Illness  Pt is a 63 y.o. female presenting to hospital 01/05/24 with c/o SOB, leg swelling, and weight gain.  Pt admitted with acute on chronic HFrEF and acute hypoxic respiratory failure.  Transfer to CCU 01/07/24 d/t pt hypotensive and minimally responsive.  PMH includes chronic HFrEF, COPD, cocaine use disorder, htn, HLD.  Clinical Impression  New PT consult received (d/t pt transferred to ICU and now back to progressive care unit); PT re-evaluation performed.  Prior to recent medical concerns, pt reports being ambulatory.  Currently pt is modified independent with bed mobility; independent with transfers; and CGA with ambulation 200 feet with RW use (pt requesting to use RW to walk today).  Mild SOB noted with activity (SpO2 sats 98% or greater on room air).  Pt reporting initial dizziness when standing/walking but fairly quickly resolved.  BP pre-ambulation (supine) 124/85 (MAP 95) with HR 103 bpm; BP post ambulation (sitting) 141/86 (MAP 102) with HR 111 bpm. Pt would currently benefit from skilled PT to address noted impairments and functional limitations (see below for any additional details).  Upon hospital discharge, pt would benefit from ongoing therapy.     If plan is discharge home, recommend the following: A little help with walking and/or transfers;A little help with bathing/dressing/bathroom;Assistance with cooking/housework;Assist for transportation;Help with stairs or ramp for entrance   Can travel by private vehicle    Yes    Equipment Recommendations Rolling walker (2 wheels) (pt has RW at home already)  Recommendations for Other Services       Functional Status Assessment Patient has had a recent decline in their functional status and demonstrates the ability to make significant improvements in function in a reasonable and  predictable amount of time.     Precautions / Restrictions Precautions Precautions: Fall Recall of Precautions/Restrictions: Intact Restrictions Weight Bearing Restrictions Per Provider Order: No      Mobility  Bed Mobility Overal bed mobility: Modified Independent Bed Mobility: Supine to Sit, Sit to Supine     Supine to sit: Modified independent (Device/Increase time), HOB elevated Sit to supine: Modified independent (Device/Increase time), HOB elevated   General bed mobility comments: no difficulties noted    Transfers Overall transfer level: Independent Equipment used: None Transfers: Sit to/from Stand Sit to Stand: Independent           General transfer comment: steady transfer from bed    Ambulation/Gait Ambulation/Gait assistance: Contact guard assist Gait Distance (Feet): 200 Feet Assistive device: Rolling walker (2 wheels) Gait Pattern/deviations: Step-through pattern, Decreased step length - left, Decreased step length - right Gait velocity: decreased     General Gait Details: steady ambulation with RW use  Stairs            Wheelchair Mobility     Tilt Bed    Modified Rankin (Stroke Patients Only)       Balance Overall balance assessment: Needs assistance Sitting-balance support: No upper extremity supported, Feet supported Sitting balance-Leahy Scale: Normal Sitting balance - Comments: steady reaching outside BOS   Standing balance support: Bilateral upper extremity supported, During functional activity, Reliant on assistive device for balance Standing balance-Leahy Scale: Good Standing balance comment: steady ambulating with RW use                             Pertinent Vitals/Pain Pain Assessment Pain  Assessment: No/denies pain Pain Intervention(s): Limited activity within patient's tolerance, Monitored during session    Home Living Family/patient expects to be discharged to:: Private residence Living  Arrangements: Alone (Apt downstairs in care home) Available Help at Discharge: Friend(s);Available PRN/intermittently Type of Home: House Home Access: Ramped entrance     Alternate Level Stairs-Number of Steps: 12 steps with R railing down to apt Home Layout: Two level (Lives in downstairs apt) Home Equipment: Agricultural consultant (2 wheels)      Prior Function Prior Level of Function : History of Falls (last six months)             Mobility Comments: Independent with ambulation; pt reports using RW when lightheaded/dizzy; last fall 3-4 weeks ago.       Extremity/Trunk Assessment   Upper Extremity Assessment Upper Extremity Assessment: Overall WFL for tasks assessed    Lower Extremity Assessment Lower Extremity Assessment: Generalized weakness    Cervical / Trunk Assessment Cervical / Trunk Assessment: Normal  Communication   Communication Communication: No apparent difficulties    Cognition Arousal: Alert Behavior During Therapy: WFL for tasks assessed/performed   PT - Cognitive impairments: No apparent impairments                         Following commands: Intact       Cueing Cueing Techniques: Verbal cues     General Comments  Nursing cleared pt for participation in physical therapy.  Pt agreeable to PT session.    Exercises     Assessment/Plan    PT Assessment Patient needs continued PT services  PT Problem List Decreased strength;Decreased activity tolerance;Decreased balance;Decreased mobility       PT Treatment Interventions DME instruction;Gait training;Stair training;Functional mobility training;Therapeutic activities;Therapeutic exercise;Balance training;Patient/family education    PT Goals (Current goals can be found in the Care Plan section)  Acute Rehab PT Goals Patient Stated Goal: to feel better overall PT Goal Formulation: With patient Time For Goal Achievement: 01/25/24 Potential to Achieve Goals: Good    Frequency Min  1X/week     Co-evaluation               AM-PAC PT "6 Clicks" Mobility  Outcome Measure Help needed turning from your back to your side while in a flat bed without using bedrails?: None Help needed moving from lying on your back to sitting on the side of a flat bed without using bedrails?: None Help needed moving to and from a bed to a chair (including a wheelchair)?: A Little Help needed standing up from a chair using your arms (e.g., wheelchair or bedside chair)?: A Little Help needed to walk in hospital room?: A Little Help needed climbing 3-5 steps with a railing? : A Little 6 Click Score: 20    End of Session Equipment Utilized During Treatment: Gait belt Activity Tolerance: Patient tolerated treatment well Patient left: in bed;with call bell/phone within reach;with bed alarm set Nurse Communication: Mobility status;Precautions;Other (comment) (Pt's BP and HR during session) PT Visit Diagnosis: Unsteadiness on feet (R26.81);History of falling (Z91.81);Other abnormalities of gait and mobility (R26.89);Muscle weakness (generalized) (M62.81)    Time: 1014-1030 PT Time Calculation (min) (ACUTE ONLY): 16 min   Charges:   PT Evaluation $PT Re-evaluation: 1 Re-eval   PT General Charges $$ ACUTE PT VISIT: 1 Visit        Amador Junes, PT 01/11/24, 11:08 AM

## 2024-01-12 DIAGNOSIS — I5023 Acute on chronic systolic (congestive) heart failure: Secondary | ICD-10-CM | POA: Diagnosis not present

## 2024-01-12 LAB — BASIC METABOLIC PANEL WITH GFR
Anion gap: 11 (ref 5–15)
BUN: 28 mg/dL — ABNORMAL HIGH (ref 8–23)
CO2: 23 mmol/L (ref 22–32)
Calcium: 9.5 mg/dL (ref 8.9–10.3)
Chloride: 103 mmol/L (ref 98–111)
Creatinine, Ser: 0.66 mg/dL (ref 0.44–1.00)
GFR, Estimated: >60 mL/min (ref >60)
Glucose, Bld: 103 mg/dL — ABNORMAL HIGH (ref 70–99)
Potassium: 4.8 mmol/L (ref 3.5–5.1)
Sodium: 137 mmol/L (ref 135–145)

## 2024-01-12 LAB — GLUCOSE, CAPILLARY
Glucose-Capillary: 182 mg/dL — ABNORMAL HIGH (ref 70–99)
Glucose-Capillary: 126 mg/dL — ABNORMAL HIGH (ref 70–99)

## 2024-01-12 LAB — MAGNESIUM: Magnesium: 2.1 mg/dL (ref 1.7–2.4)

## 2024-01-12 LAB — DIGOXIN LEVEL: Digoxin Level: 0.8 ng/mL (ref 0.8–2.0)

## 2024-01-12 MED ORDER — NYSTATIN-TRIAMCINOLONE 100000-0.1 UNIT/GM-% EX CREA
TOPICAL_CREAM | Freq: Two times a day (BID) | CUTANEOUS | Status: DC
  Filled 2024-01-12: qty 15

## 2024-01-12 MED ORDER — SPIRONOLACTONE 12.5 MG HALF TABLET
12.5000 mg | ORAL_TABLET | Freq: Every day | ORAL | Status: DC
  Administered 2024-01-12: 12.5 mg via ORAL
  Filled 2024-01-12: qty 1

## 2024-01-12 MED ORDER — FLUCONAZOLE 50 MG PO TABS
150.0000 mg | ORAL_TABLET | Freq: Once | ORAL | Status: DC
  Filled 2024-01-12: qty 1

## 2024-01-12 MED ORDER — LOSARTAN POTASSIUM 25 MG PO TABS: 12.5000 mg | ORAL_TABLET | Freq: Every day | ORAL | Status: DC

## 2024-01-12 MED ORDER — FLUCONAZOLE 150 MG PO TABS: 150.0000 mg | ORAL_TABLET | Freq: Once | ORAL | 0 refills | Status: AC

## 2024-01-12 MED ORDER — NYSTATIN-TRIAMCINOLONE 100000-0.1 UNIT/GM-% EX CREA: TOPICAL_CREAM | Freq: Two times a day (BID) | CUTANEOUS | Status: DC

## 2024-01-12 MED ORDER — DOXYCYCLINE HYCLATE 100 MG PO TABS: 100.0000 mg | ORAL_TABLET | Freq: Two times a day (BID) | ORAL | 0 refills | Status: AC

## 2024-01-12 NOTE — TOC Transition Note (Signed)
 Transition of Care [facility]) - Discharge Note   Patient Details  Name: Penny Gill MRN: [id number] Date of Birth: 15-Mar-1961  [facility] CM/SW Contact:  Dayelin Balducci C Tkeya Stencil, RN Phone Number: 01/12/2024, 1:30 PM   Clinical Narrative: Spoke with patient regarding discharge home. Her cousin will transport her home. Patient is already active at [facility].   TOC signing off.          Patient Goals and CMS Choice            Discharge Placement                       Discharge Plan and Services Additional resources added to the After Visit Summary for                                       Social Drivers of Health (SDOH) Interventions SDOH Screenings   Food Insecurity: No Food Insecurity (01/05/2024)  Housing: Low Risk  (01/05/2024)  Transportation Needs: No Transportation Needs (01/05/2024)  Utilities: Not At Risk (01/05/2024)  Depression (PHQ2-9): Low Risk  (01/11/2020)  Financial Resource Strain: Medium Risk (12/22/2023)  Physical Activity: Not on File (09/10/2021)   Received from [place], [place]  Social Connections: Unknown (01/05/2024)  Stress: Not on File (09/10/2021)   Received from [facility], [place]  Tobacco Use: High Risk (01/05/2024)     Readmission Risk Interventions     No data to display

## 2024-01-12 NOTE — Discharge Summary (Addendum)
 Physician Discharge Summary  Penny Gill ZOX:[id number] DOB: 11-19-1960 DOA: 01/05/2024  PCP: Pcp, No  Admit date: 01/05/2024 Discharge date: 01/12/2024  Admitted From: home  Disposition:  home   Recommendations for Outpatient Follow-up:  Follow up with PCP in 1-2 weeks F/u w/ cardio, Dr. Alvenia Aus, in 1-2 weeks F/u w/ HF clinic within 1 week  Home Health: no  Equipment/Devices:  Discharge Condition: stable  CODE STATUS: full  Diet recommendation: Heart Healthy  Brief/Interim Summary: You may copy/paste interim summary or write brief hospital course depending on length of stay  Discharge Diagnoses:  Principal Problem:   Acute on chronic HFrEF (heart failure with reduced ejection fraction) (HCC) Active Problems:   Shock (HCC)  Acute on chronic combined CHF: restart coreg , aldactone , losartan  as per cardio. D/c farxiga  as per cardio. D/c midodrine  as per cardio. Monitor I/Os. 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 D/c digoxin  as per cardio   Severe MR: management as per cario   Acute hypoxic respiratory failure: initial oxygen saturation was 88% on 2 L of oxygen. Weaned off of supplemental oxygen. Resolved  Possible CAP: completed abx course. Continue on bronchodilators & encourage incentive  spirometry  Likely vaginal yeast infection: fluconazole x 2. Nystatin-triamcinolone ointment x 5 days                         Hypomagnesemia: WNL today   Hypophosphatemia: WNL today   Hypotension: likely secondary to circulatory shock. See NP Rust[place]Chester on how pt met circulatory shock criteria. Weaned off of pressors. Keep MAP >65. Resolved  Hypokalemia: WNL today   Elevated troponins: likely secondary to demand ischemia & cocaine use. No chest pain currently. Right and left heart cath in March 2025 showed normal coronaries. Cardio following and recs apprec   Cocaine abuse: urine drug screen was positive for cocaine. Received  cessation counseling already.  Transaminitis: etiology unclear, likely secondary to cocaine abuse. AST is WNL and ALT is still elevated but trending down   Diarrhea: started on 01/07/24. GI PCR panel was neg. Resolved    Fever: likely secondary to CAP. Blood cxs NGTD. UA showed large leukocytes but rare bacteria. No fever > 72 hrs. Resolved  COPD: w/o exacerbation. Bronchodilators prn   Discharge Instructions  Discharge Instructions     Amb Referral to HF Clinic   Complete by: As directed    Reason for referral: Systolic HF   Diet - low sodium heart healthy   Complete by: As directed    Discharge instructions   Complete by: As directed    F/u w/ PCP in 1-2 weeks. F/u w/ cardio, Dr. Alvenia Aus, in 1-2 weeks. F/u w/ HF clinic within 1 week   Increase activity slowly   Complete by: As directed       Allergies as of 01/12/2024   No Known Allergies      Medication List     STOP taking these medications    dapagliflozin  propanediol 10 MG Tabs tablet Commonly known as: FARXIGA    feeding supplement Liqd       TAKE these medications    acetaminophen  325 MG tablet Commonly known as: TYLENOL  Take 2 tablets (650 mg total) by mouth every 6 (six) hours as needed for mild pain (pain score 1-3), moderate pain (pain score 4-6), fever or headache (or Fever >/= 101).   aspirin  EC 81 MG tablet Take 1 tablet (81 mg total) by mouth daily.  Swallow whole.   atorvastatin  40 MG tablet Commonly known as: LIPITOR Take 1 tablet (40 mg total) by mouth at bedtime.   carvedilol  3.125 MG tablet Commonly known as: COREG  Take 1 tablet (3.125 mg total) by mouth 2 (two) times daily with a meal.   doxycycline  100 MG tablet Commonly known as: VIBRA -TABS Take 1 tablet (100 mg total) by mouth every 12 (twelve) hours for 2 days.   DULoxetine  30 MG capsule Commonly known as: CYMBALTA  Take 1 capsule by mouth daily.   fluconazole 150 MG tablet Commonly known as: DIFLUCAN Take 1 tablet (150 mg  total) by mouth once for 1 dose. Start taking on: Jan 13, 2024   folic acid  1 MG tablet Commonly known as: FOLVITE  Take 1 tablet (1 mg total) by mouth daily.   furosemide  20 MG tablet Commonly known as: LASIX  Take 1 tablet (20 mg total) by mouth daily as needed for fluid. Weight > 3 lb in a 24 hour period.   gabapentin  300 MG capsule Commonly known as: NEURONTIN  Take 300 mg by mouth 4 (four) times daily.   losartan  25 MG tablet Commonly known as: COZAAR  Take 0.5 tablets (12.5 mg total) by mouth daily.   multivitamin with minerals Tabs tablet Take 1 tablet by mouth daily.   nicotine  7 mg/24hr patch Commonly known as: NICODERM CQ  - dosed in mg/24 hr Place 1 patch (7 mg total) onto the skin daily.   nystatin-triamcinolone cream Commonly known as: MYCOLOG II Apply topically 2 (two) times daily.   spironolactone  25 MG tablet Commonly known as: ALDACTONE  Take 0.5 tablets (12.5 mg total) by mouth daily.   thiamine  100 MG tablet Commonly known as: VITAMIN B1 Take 1 tablet (100 mg total) by mouth daily.   tiZANidine 2 MG tablet Commonly known as: ZANAFLEX Take 2 mg by mouth every 8 (eight) hours as needed for muscle spasms.        Follow-up Information     [facility]. Go on 01/19/2024.   Specialty: Cardiology Why: Hospital Follow-Up 01/19/2024 @ 1:30 PM Please bring all medications to follow-up appointment [facility], [address], Second Floor Free Valet Parking at the door Contact information: [address] [place]  [phone]               No Known Allergies  Consultations: Cardio  ICU   Procedures/Studies: ECHOCARDIOGRAM COMPLETE BUBBLE STUDY Result Date: 01/09/2024    ECHOCARDIOGRAM REPORT   Patient Name:   Penny Gill Date of Exam: 01/09/2024 Medical Rec #:  [id number]       Height:       60.0 in Accession #:    [id number]      Weight:       74.5 lb  Date of Birth:  [birth year]       BSA:          1.227 m Patient Age:    63 years        BP:           118/82 mmHg Patient Gender: F               HR:           90 bpm. Exam Location:  [facility] Procedure: 2D Echo, Cardiac Doppler, Color Doppler, Strain Analysis and Saline            Contrast Bubble Study (Both Spectral and Color Flow Doppler were  utilized during procedure). Indications:     Murmur R01.1  History:         Patient has prior history of Echocardiogram examinations, most                  recent 11/15/2023.  Sonographer:     Clenton Czech DCS, FASE Referring Phys:  [id number] BRENDA MORRISON Diagnosing Phys: Jackquelyn Mass MD  Sonographer Comments: Global longitudinal strain was attempted. IMPRESSIONS  1. Left ventricular ejection fraction, by estimation, is 25 to 30%. The left ventricle has severely decreased function. The left ventricle demonstrates global hypokinesis. The left ventricular internal cavity size was mildly dilated. Left ventricular diastolic function could not be evaluated.  2. Right ventricular systolic function is mildly reduced. The right ventricular size is normal. There is mildly elevated pulmonary artery systolic pressure. The estimated right ventricular systolic pressure is 41.4 mmHg.  3. Left atrial size was severely dilated.  4. PFO is present by color doppler but bubble is negative. Suspect elevated LAP is reason fro negative bubble. Agitated saline contrast bubble study was negative, with no evidence of any interatrial shunt. There is a small patent foramen ovale with predominantly left to right shunting across the atrial septum.  5. Right atrial size was mildly dilated.  6. Severe MR is present due to restricted PMVL from cardiomyopathy. The mitral valve is abnormal. Severe mitral valve regurgitation. No evidence of mitral stenosis.  7. The aortic valve is tricuspid. Aortic valve regurgitation is not visualized. No aortic stenosis is present.  8. The inferior vena cava is  normal in size with greater than 50% respiratory variability, suggesting right atrial pressure of 3 mmHg. Comparison(s): Changes from prior study are noted. LVEF 25-30%. Severe MR now present, appears functional due to restricted PMVL. FINDINGS  Left Ventricle: Left ventricular ejection fraction, by estimation, is 25 to 30%. The left ventricle has severely decreased function. The left ventricle demonstrates global hypokinesis. The left ventricular internal cavity size was mildly dilated. There is no left ventricular hypertrophy. Left ventricular diastolic function could not be evaluated due to mitral regurgitation (moderate or greater). Left ventricular diastolic function could not be evaluated. Right Ventricle: The right ventricular size is normal. No increase in right ventricular wall thickness. Right ventricular systolic function is mildly reduced. There is mildly elevated pulmonary artery systolic pressure. The tricuspid regurgitant velocity  is 3.10 m/s, and with an assumed right atrial pressure of 3 mmHg, the estimated right ventricular systolic pressure is 41.4 mmHg. Left Atrium: Left atrial size was severely dilated. Right Atrium: Right atrial size was mildly dilated. Pericardium: There is no evidence of pericardial effusion. Mitral Valve: Severe MR is present due to restricted PMVL from cardiomyopathy. The mitral valve is abnormal. Severe mitral valve regurgitation. No evidence of mitral valve stenosis. MV peak gradient, 9.4 mmHg. The mean mitral valve gradient is 4.0 mmHg. Tricuspid Valve: The tricuspid valve is grossly normal. Tricuspid valve regurgitation is mild . No evidence of tricuspid stenosis. Aortic Valve: The aortic valve is tricuspid. Aortic valve regurgitation is not visualized. No aortic stenosis is present. Aortic valve peak gradient measures 4.4 mmHg. Pulmonic Valve: The pulmonic valve was grossly normal. Pulmonic valve regurgitation is mild. No evidence of pulmonic stenosis. Aorta: The  aortic root and ascending aorta are structurally normal, with no evidence of dilitation. Venous: The inferior vena cava is normal in size with greater than 50% respiratory variability, suggesting right atrial pressure of 3 mmHg. IAS/Shunts: The interatrial septum is aneurysmal. The atrial septum  is grossly normal. Agitated saline contrast was given intravenously to evaluate for intracardiac shunting. Agitated saline contrast bubble study was negative, with no evidence of any interatrial shunt. A small patent foramen ovale is detected with predominantly left to right shunting across the atrial septum.  LEFT VENTRICLE PLAX 2D LVIDd:         6.10 cm      Diastology LVIDs:         5.20 cm      LV e' medial:    7.40 cm/s LV PW:         1.00 cm      LV E/e' medial:  13.3 LV IVS:        0.70 cm      LV e' lateral:   11.10 cm/s LVOT diam:     1.80 cm      LV E/e' lateral: 8.9 LV SV:         29 LV SV Index:   23 LVOT Area:     2.54 cm  LV Volumes (MOD) LV vol d, MOD A2C: 114.0 ml LV vol d, MOD A4C: 145.0 ml LV vol s, MOD A2C: 77.0 ml LV vol s, MOD A4C: 96.0 ml LV SV MOD A2C:     37.0 ml LV SV MOD A4C:     145.0 ml LV SV MOD BP:      42.4 ml RIGHT VENTRICLE RV Basal diam:  2.40 cm RV S prime:     10.80 cm/s TAPSE (M-mode): 1.3 cm LEFT ATRIUM           Index        RIGHT ATRIUM           Index LA diam:      4.10 cm 3.34 cm/m   RA Area:     11.50 cm LA Vol (A2C): 66.2 ml 53.96 ml/m  RA Volume:   28.70 ml  23.39 ml/m LA Vol (A4C): 44.1 ml 35.94 ml/m  AORTIC VALVE                 PULMONIC VALVE AV Area (Vmax): 1.88 cm     PV Vmax:          0.91 m/s AV Vmax:        105.00 cm/s  PV Peak grad:     3.3 mmHg AV Peak Grad:   4.4 mmHg     PR End Diast Vel: 12.96 msec LVOT Vmax:      77.40 cm/s LVOT Vmean:     49.100 cm/s LVOT VTI:       0.112 m  AORTA Ao Root diam: 3.30 cm Ao Asc diam:  3.00 cm MITRAL VALVE                  TRICUSPID VALVE MV Area (PHT): 4.60 cm       TR Peak grad:   38.4 mmHg MV Area VTI:   0.84 cm       TR  Vmax:        310.00 cm/s MV Peak grad:  9.4 mmHg MV Mean grad:  4.0 mmHg       SHUNTS MV Vmax:       1.53 m/s       Systemic VTI:  0.11 m MV Vmean:      91.4 cm/s      Systemic Diam: 1.80 cm MV Decel Time: 165 msec MR Peak grad:    122.3 mmHg MR Mean grad:  76.0 mmHg MR Vmax:         553.00 cm/s MR Vmean:        402.0 cm/s MR PISA:         2.65 cm MR PISA Eff ROA: 15 mm MR PISA Radius:  0.65 cm MV E velocity: 98.50 cm/s MV A velocity: 65.50 cm/s MV E/A ratio:  1.50 Jackquelyn Mass MD Electronically signed by Jackquelyn Mass MD Signature Date/Time: 01/09/2024/2:23:54 PM    Final    CT Angio Chest Pulmonary Embolism (PE) W or WO Contrast Result Date: 01/07/2024 CLINICAL DATA:  Positive D-dimer abdomen pain EXAM: CT ANGIOGRAPHY CHEST CT ABDOMEN AND PELVIS WITH CONTRAST TECHNIQUE: Multidetector CT imaging of the chest was performed using the standard protocol during bolus administration of intravenous contrast. Multiplanar CT image reconstructions and MIPs were obtained to evaluate the vascular anatomy. Multidetector CT imaging of the abdomen and pelvis was performed using the standard protocol during bolus administration of intravenous contrast. RADIATION DOSE REDUCTION: This exam was performed according to the departmental dose-optimization program which includes automated exposure control, adjustment of the mA and/or kV according to patient size and/or use of iterative reconstruction technique. CONTRAST:  75mL OMNIPAQUE  IOHEXOL  350 MG/ML SOLN COMPARISON:  Chest x-ray 01/07/2024, CT chest abdomen pelvis 11/14/2023 FINDINGS: CTA CHEST FINDINGS Cardiovascular: Satisfactory opacification of the pulmonary arteries to the segmental level. No evidence of pulmonary embolism. Nonaneurysmal aorta. Cardiomegaly. No pericardial effusion Mediastinum/Nodes: Patent trachea. No suspicious thyroid  mass. No suspicious adenopathy. Esophagus within normal limits. Lungs/Pleura: Advanced emphysema. Calcified granuloma in the left lung  base. No acute airspace disease, pleural effusion or pneumothorax Musculoskeletal: No acute osseous abnormality. Review of the MIP images confirms the above findings. CT ABDOMEN and PELVIS FINDINGS Hepatobiliary: No gallstone. Mild pericholecystic fluid or wall thickening. No bilary dilatation. Pancreas: Unremarkable. No pancreatic ductal dilatation or surrounding inflammatory changes. Spleen: Normal in size without focal abnormality. Adrenals/Urinary Tract: Bilateral adrenal glands are normal. Cortical scarring of the rightkidney. Nonobstructing right kidney stones. Patchy hypoenhancement of the kidneys on delayed images. Bladder is distended. Small amount of air in the bladder with catheter. Stomach/Bowel: Stomach is within normal limits. No evidence of bowel wall thickening, distention, or inflammatory changes. Vascular/Lymphatic: Aortic atherosclerosis. No enlarged abdominal or pelvic lymph nodes. Reproductive: 8.9 by 6 cm posterior pelvic mass, stable and probably uterine fibroids given lack of interval change. Other: No free air.Small free fluid in the pelvis. Musculoskeletal: No acute or significant osseous findings. Review of the MIP images confirms the above findings. IMPRESSION: 1. Negative for acute pulmonary embolus or aortic dissection. Advanced emphysema. 2. Cardiomegaly. 3. Patchy hypoenhancement of the kidneys on delayed images, question pyelonephritis. Nonobstructing right kidney stones. Cortical scarring in the right kidney. 4. Mild gallbladder wall thickening or small volume pericholecystic fluid. No calcified stones. Correlate with US  if indicated. 5. Small free fluid in the pelvis. 6. Stable 8.9 cm posterior pelvic mass, probably uterine fibroids given lack of interval change. Aortic Atherosclerosis (ICD10-I70.0) and Emphysema (ICD10-J43.9). Electronically Signed   By: Esmeralda Hedge M.D.   On: 01/07/2024 23:37   CT ABDOMEN PELVIS W CONTRAST Result Date: 01/07/2024 CLINICAL DATA:  Positive  D-dimer abdomen pain EXAM: CT ANGIOGRAPHY CHEST CT ABDOMEN AND PELVIS WITH CONTRAST TECHNIQUE: Multidetector CT imaging of the chest was performed using the standard protocol during bolus administration of intravenous contrast. Multiplanar CT image reconstructions and MIPs were obtained to evaluate the vascular anatomy. Multidetector CT imaging of the abdomen and pelvis was performed using the standard protocol during  bolus administration of intravenous contrast. RADIATION DOSE REDUCTION: This exam was performed according to the departmental dose-optimization program which includes automated exposure control, adjustment of the mA and/or kV according to patient size and/or use of iterative reconstruction technique. CONTRAST:  75mL OMNIPAQUE  IOHEXOL  350 MG/ML SOLN COMPARISON:  Chest x-ray 01/07/2024, CT chest abdomen pelvis 11/14/2023 FINDINGS: CTA CHEST FINDINGS Cardiovascular: Satisfactory opacification of the pulmonary arteries to the segmental level. No evidence of pulmonary embolism. Nonaneurysmal aorta. Cardiomegaly. No pericardial effusion Mediastinum/Nodes: Patent trachea. No suspicious thyroid  mass. No suspicious adenopathy. Esophagus within normal limits. Lungs/Pleura: Advanced emphysema. Calcified granuloma in the left lung base. No acute airspace disease, pleural effusion or pneumothorax Musculoskeletal: No acute osseous abnormality. Review of the MIP images confirms the above findings. CT ABDOMEN and PELVIS FINDINGS Hepatobiliary: No gallstone. Mild pericholecystic fluid or wall thickening. No bilary dilatation. Pancreas: Unremarkable. No pancreatic ductal dilatation or surrounding inflammatory changes. Spleen: Normal in size without focal abnormality. Adrenals/Urinary Tract: Bilateral adrenal glands are normal. Cortical scarring of the rightkidney. Nonobstructing right kidney stones. Patchy hypoenhancement of the kidneys on delayed images. Bladder is distended. Small amount of air in the bladder with  catheter. Stomach/Bowel: Stomach is within normal limits. No evidence of bowel wall thickening, distention, or inflammatory changes. Vascular/Lymphatic: Aortic atherosclerosis. No enlarged abdominal or pelvic lymph nodes. Reproductive: 8.9 by 6 cm posterior pelvic mass, stable and probably uterine fibroids given lack of interval change. Other: No free air.Small free fluid in the pelvis. Musculoskeletal: No acute or significant osseous findings. Review of the MIP images confirms the above findings. IMPRESSION: 1. Negative for acute pulmonary embolus or aortic dissection. Advanced emphysema. 2. Cardiomegaly. 3. Patchy hypoenhancement of the kidneys on delayed images, question pyelonephritis. Nonobstructing right kidney stones. Cortical scarring in the right kidney. 4. Mild gallbladder wall thickening or small volume pericholecystic fluid. No calcified stones. Correlate with US  if indicated. 5. Small free fluid in the pelvis. 6. Stable 8.9 cm posterior pelvic mass, probably uterine fibroids given lack of interval change. Aortic Atherosclerosis (ICD10-I70.0) and Emphysema (ICD10-J43.9). Electronically Signed   By: Esmeralda Hedge M.D.   On: 01/07/2024 23:37   DG Chest Port 1 View Result Date: 01/07/2024 CLINICAL DATA:  Central line EXAM: PORTABLE CHEST 1 VIEW COMPARISON:  01/07/2024, 01/05/2024 FINDINGS: Right IJ central venous catheter tip over the cavoatrial region. No visible right pneumothorax. Stable cardiomediastinal silhouette with mild airspace opacity in the left infrahilar lung. IMPRESSION: Right IJ central venous catheter tip over the cavoatrial region. No visible pneumothorax. Mild left infrahilar atelectasis or infiltrate Electronically Signed   By: Esmeralda Hedge M.D.   On: 01/07/2024 22:49   DG Chest Port 1 View Result Date: 01/07/2024 CLINICAL DATA:  Respiratory distress EXAM: PORTABLE CHEST 1 VIEW COMPARISON:  01/05/2024 FINDINGS: Perihilar and bibasilar pulmonary infiltrates have significantly  improved in the interval. Background coarsening of the pulmonary interstitium is again noted. No confluent pulmonary infiltrate. No pneumothorax or pleural effusion. Cardiac size is within normal limits. Pulmonary vascularity is normal. No acute bone abnormality. IMPRESSION: 1. Significant interval improvement in perihilar and bibasilar pulmonary infiltrates. Electronically Signed   By: Worthy Heads M.D.   On: 01/07/2024 20:25   DG Chest 2 View Result Date: 01/05/2024 CLINICAL DATA:  Shortness of breath EXAM: CHEST - 2 VIEW COMPARISON:  X-ray 12/20/2023 FINDINGS: Stable cardiopericardial silhouette. No pneumothorax or effusion. Prominent central vasculature. Increasing lung base parenchymal interstitial opacities. Acute infiltrate is possible. Overlapping cardiac leads. Degenerative changes of the spine. IMPRESSION: Increasing lung base parenchymal  interstitial opacities. Acute process is possible. Recommend follow-up. Electronically Signed   By: Adrianna Horde M.D.   On: 01/05/2024 14:25   CARDIAC CATHETERIZATION Result Date: 12/22/2023 Conclusions: No angiographically significant coronary artery disease.  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 Normal left and right heart filling pressures (RA 3, RV 26/3, PCWP 8, LVEDP 10 mmHg). Upper normal pulmonary artery pressure with elevated vascular resistance (PA 27/14, mean 18 mmHg; PVR 4 WU). Mildly reduced Fick cardiac index (CO 2.5 L/min, CI 2.1 L/min/m^2). Recommendations: Maintain net even to slightly positive fluid balance over next 24 hours. Escalate goal-directed medical therapy as tolerated; will add losartan  12.5 mg daily. Sammy Crisp, MD [facility]  DG Chest Port 1 View Result Date: 12/20/2023 CLINICAL DATA:  Cough and shortness of breath EXAM: PORTABLE CHEST 1 VIEW COMPARISON:  11/14/2023 chest CT FINDINGS: Chronic lung disease with generalized interstitial coarsening and hazy density. Some Kerley lines are present and there  is chronic cardiomegaly. Negative aortic and hilar contours. No pleural fluid or pneumothorax. IMPRESSION: Bilateral pulmonary opacity, likely edema superimposed on emphysema, similar to studies from last month. Electronically Signed   By: Ronnette Coke M.D.   On: 12/20/2023 05:43   (Echo, Carotid, EGD, Colonoscopy, ERCP)    Subjective: pt c/o fatigue    Discharge Exam: Vitals:   01/12/24 0758 01/12/24 1214  BP: 97/76 109/82  Pulse: 99 87  Resp:    Temp: 98.7 F (37.1 C) 98.5 F (36.9 C)  SpO2: 99% 100%   Vitals:   01/12/24 0450 01/12/24 0504 01/12/24 0758 01/12/24 1214  BP: 130/79  97/76 109/82  Pulse: (!) 101  99 87  Resp: 18     Temp: 98.6 F (37 C)  98.7 F (37.1 C) 98.5 F (36.9 C)  TempSrc:   Oral Oral  SpO2: 99%  99% 100%  Weight:  33.4 kg    Height:        General: Pt is alert, awake, not in acute distress Cardiovascular: S1/S2 +, no rubs, no gallops Respiratory: decreased breath sounds b/l  Abdominal: Soft, NT, ND, bowel sounds + Extremities: no edema, no cyanosis    The results of significant diagnostics from this hospitalization (including imaging, microbiology, ancillary and laboratory) are listed below for reference.     Microbiology: Recent Results (from the past 240 hours)  Culture, blood (Routine X 2) w Reflex to ID Panel     Status: None   Collection Time: 01/05/24 11:46 PM   Specimen: BLOOD  Result Value Ref Range Status   Specimen Description BLOOD RIGHT FA  Final   Special Requests   Final    BOTTLES DRAWN AEROBIC AND ANAEROBIC Blood Culture results may not be optimal due to an inadequate volume of blood received in culture bottles   Culture   Final    NO GROWTH 5 DAYS Performed at [facility], [address] [place], [place] [address]    Report Status 01/11/2024 FINAL  Final  Culture, blood (Routine X 2) w Reflex to ID Panel     Status: None   Collection Time: 01/05/24 11:46 PM   Specimen: BLOOD  Result Value Ref Range  Status   Specimen Description BLOOD LEFT FA  Final   Special Requests   Final    BOTTLES DRAWN AEROBIC AND ANAEROBIC Blood Culture results may not be optimal due to an inadequate volume of blood received in culture bottles   Culture   Final    NO GROWTH 5 DAYS  Performed at [facility], [address], [place], [place] [address]    Report Status 01/11/2024 FINAL  Final  MRSA Next Gen by PCR, Nasal     Status: None   Collection Time: 01/07/24  8:20 PM   Specimen: Nasal Mucosa; Nasal Swab  Result Value Ref Range Status   MRSA by PCR Next Gen NOT DETECTED NOT DETECTED Final    Comment: (NOTE) The GeneXpert MRSA Assay (FDA approved for NASAL specimens only), is one component of a comprehensive MRSA colonization surveillance program. It is not intended to diagnose MRSA infection nor to guide or monitor treatment for MRSA infections. Test performance is not FDA approved in patients less than 11 years old. Performed at [facility], [address] [place], [place] [address]   Gastrointestinal Panel by PCR , Stool     Status: None   Collection Time: 01/08/24 11:12 AM   Specimen: Stool  Result Value Ref Range Status   Campylobacter species NOT DETECTED NOT DETECTED Final   Plesimonas shigelloides NOT DETECTED NOT DETECTED Final   Salmonella species NOT DETECTED NOT DETECTED Final   Yersinia enterocolitica NOT DETECTED NOT DETECTED Final   Vibrio species NOT DETECTED NOT DETECTED Final   Vibrio cholerae NOT DETECTED NOT DETECTED Final   Enteroaggregative E coli (EAEC) NOT DETECTED NOT DETECTED Final   Enteropathogenic E coli (EPEC) NOT DETECTED NOT DETECTED Final   Enterotoxigenic E coli (ETEC) NOT DETECTED NOT DETECTED Final   Shiga like toxin producing E coli (STEC) NOT DETECTED NOT DETECTED Final   Shigella/Enteroinvasive E coli (EIEC) NOT DETECTED NOT DETECTED Final   Cryptosporidium NOT DETECTED NOT DETECTED Final   Cyclospora cayetanensis NOT DETECTED NOT  DETECTED Final   Entamoeba histolytica NOT DETECTED NOT DETECTED Final   Giardia lamblia NOT DETECTED NOT DETECTED Final   Adenovirus F40/41 NOT DETECTED NOT DETECTED Final   Astrovirus NOT DETECTED NOT DETECTED Final   Norovirus GI/GII NOT DETECTED NOT DETECTED Final   Rotavirus A NOT DETECTED NOT DETECTED Final   Sapovirus (I, II, IV, and V) NOT DETECTED NOT DETECTED Final    Comment: Performed at [facility], [address] [place], [place] [address]     Labs: BNP (last 3 results) Recent Labs    11/14/23 1832 12/20/23 0513 01/05/24 1438  BNP 940.5* 1,612.1* 1,852.3*   Basic Metabolic Panel: Recent Labs  Lab 01/07/24 1958 01/08/24 0635 01/09/24 0431 01/10/24 0434 01/11/24 0425 01/12/24 0456  NA 135 140 139 141 139 137  K 4.5 4.2 3.8 4.0 4.4 4.8  CL 104 105 108 108 107 103  CO2 18* 25 24 24 24 23   GLUCOSE 292* 122* 132* 98 119* 103*  BUN 32* 31* 19 21 19  28*  CREATININE 1.41* 1.09* 0.81 0.69 0.68 0.66  CALCIUM  7.9* 8.3* 8.4* 8.5* 8.8* 9.5  MG 2.1  --  1.6* 2.0 1.8 2.1  PHOS 4.3  --  2.0* 3.4 3.6  --    Liver Function Tests: Recent Labs  Lab 01/07/24 1958 01/08/24 0635 01/10/24 0434  AST 677* 289* 40  ALT 321* 306* 120*  ALKPHOS 269* 261* 179*  BILITOT 1.0 0.7 0.6  PROT 5.4* 5.9* 5.9*  ALBUMIN 2.4* 2.6* 2.4*   Recent Labs  Lab 01/07/24 1958  LIPASE 31   No results for input(s): "AMMONIA" in the last 168 hours. CBC: Recent Labs  Lab 01/07/24 1958 01/08/24 0635 01/09/24 0431 01/10/24 0434 01/11/24 0425  WBC 6.9 13.4* 7.7 7.4 6.9  NEUTROABS 4.9  --   --   --   --   HGB 11.0* 11.2* 11.0* 11.6* 11.6*  HCT 34.3* 34.2* 33.6* 34.8* 35.4*  MCV 90.5 91.7 90.8 88.5 89.2  PLT 237 279 253 299 316   Cardiac Enzymes: No results for input(s): "CKTOTAL", "CKMB", "CKMBINDEX", "TROPONINI" in the last 168 hours. BNP: Invalid input(s): "POCBNP" CBG: Recent Labs  Lab 01/11/24 1221 01/11/24 1650 01/11/24 2019 01/12/24 0800 01/12/24 1216   GLUCAP 94 123* 105* 182* 126*   D-Dimer No results for input(s): "DDIMER" in the last 72 hours. Hgb A1c No results for input(s): "HGBA1C" in the last 72 hours. Lipid Profile No results for input(s): "CHOL", "HDL", "LDLCALC", "TRIG", "CHOLHDL", "LDLDIRECT" in the last 72 hours. Thyroid  function studies No results for input(s): "TSH", "T4TOTAL", "T3FREE", "THYROIDAB" in the last 72 hours.  Invalid input(s): "FREET3" Anemia work up No results for input(s): "VITAMINB12", "FOLATE", "FERRITIN", "TIBC", "IRON", "RETICCTPCT" in the last 72 hours. Urinalysis    Component Value Date/Time   COLORURINE YELLOW (A) 01/05/2024 2346   APPEARANCEUR CLOUDY (A) 01/05/2024 2346   APPEARANCEUR Hazy (A) 12/30/2019 1042   LABSPEC 1.011 01/05/2024 2346   LABSPEC 1.011 07/05/2014 1255   PHURINE 6.0 01/05/2024 2346   GLUCOSEU >=500 (A) 01/05/2024 2346   GLUCOSEU Negative 07/05/2014 1255   HGBUR LARGE (A) 01/05/2024 2346   BILIRUBINUR NEGATIVE 01/05/2024 2346   BILIRUBINUR Negative 12/30/2019 1042   BILIRUBINUR Negative 07/05/2014 1255   KETONESUR NEGATIVE 01/05/2024 2346   PROTEINUR 100 (A) 01/05/2024 2346   NITRITE NEGATIVE 01/05/2024 2346   LEUKOCYTESUR LARGE (A) 01/05/2024 2346   LEUKOCYTESUR 1+ 07/05/2014 1255   Sepsis Labs Recent Labs  Lab 01/08/24 0635 01/09/24 0431 01/10/24 0434 01/11/24 0425  WBC 13.4* 7.7 7.4 6.9   Microbiology Recent Results (from the past 240 hours)  Culture, blood (Routine X 2) w Reflex to ID Panel     Status: None   Collection Time: 01/05/24 11:46 PM   Specimen: BLOOD  Result Value Ref Range Status   Specimen Description BLOOD RIGHT FA  Final   Special Requests   Final    BOTTLES DRAWN AEROBIC AND ANAEROBIC Blood Culture results may not be optimal due to an inadequate volume of blood received in culture bottles   Culture   Final    NO GROWTH 5 DAYS Performed at [facility], [address] [place], [place] [address]    Report Status 01/11/2024  FINAL  Final  Culture, blood (Routine X 2) w Reflex to ID Panel     Status: None   Collection Time: 01/05/24 11:46 PM   Specimen: BLOOD  Result Value Ref Range Status   Specimen Description BLOOD LEFT FA  Final   Special Requests   Final    BOTTLES DRAWN AEROBIC AND ANAEROBIC Blood Culture results may not be optimal due to an inadequate volume of blood received in culture bottles   Culture   Final    NO GROWTH 5 DAYS Performed at [facility], [address] [place], [place] [address]    Report Status 01/11/2024 FINAL  Final  MRSA Next Gen by PCR, Nasal     Status: None   Collection Time: 01/07/24  8:20 PM   Specimen: Nasal Mucosa; Nasal Swab  Result Value Ref Range Status   MRSA by PCR Next Gen NOT DETECTED NOT DETECTED Final    Comment: (NOTE) The GeneXpert MRSA Assay (FDA approved for NASAL specimens only), is one component of  a comprehensive MRSA colonization surveillance program. It is not intended to diagnose MRSA infection nor to guide or monitor treatment for MRSA infections. Test performance is not FDA approved in patients less than 71 years old. Performed at [facility], [address] [place], [place] [address]   Gastrointestinal Panel by PCR , Stool     Status: None   Collection Time: 01/08/24 11:12 AM   Specimen: Stool  Result Value Ref Range Status   Campylobacter species NOT DETECTED NOT DETECTED Final   Plesimonas shigelloides NOT DETECTED NOT DETECTED Final   Salmonella species NOT DETECTED NOT DETECTED Final   Yersinia enterocolitica NOT DETECTED NOT DETECTED Final   Vibrio species NOT DETECTED NOT DETECTED Final   Vibrio cholerae NOT DETECTED NOT DETECTED Final   Enteroaggregative E coli (EAEC) NOT DETECTED NOT DETECTED Final   Enteropathogenic E coli (EPEC) NOT DETECTED NOT DETECTED Final   Enterotoxigenic E coli (ETEC) NOT DETECTED NOT DETECTED Final   Shiga like toxin producing E coli (STEC) NOT DETECTED NOT DETECTED Final    Shigella/Enteroinvasive E coli (EIEC) NOT DETECTED NOT DETECTED Final   Cryptosporidium NOT DETECTED NOT DETECTED Final   Cyclospora cayetanensis NOT DETECTED NOT DETECTED Final   Entamoeba histolytica NOT DETECTED NOT DETECTED Final   Giardia lamblia NOT DETECTED NOT DETECTED Final   Adenovirus F40/41 NOT DETECTED NOT DETECTED Final   Astrovirus NOT DETECTED NOT DETECTED Final   Norovirus GI/GII NOT DETECTED NOT DETECTED Final   Rotavirus A NOT DETECTED NOT DETECTED Final   Sapovirus (I, II, IV, and V) NOT DETECTED NOT DETECTED Final    Comment: Performed at [facility], [address], [place], [place] [address]     Time coordinating discharge: Over 30 minutes  SIGNED:   Alphonsus Jeans, MD  [facility] 01/12/2024, 12:55 PM Pager   If 7PM-7AM, please contact night-coverage [email or web address]

## 2024-01-12 NOTE — Progress Notes (Signed)
 Nsg Discharge Note  Admit Date:  01/05/2024 Discharge date: 01/12/2024   Penny Gill to be D/C'd Home per MD order.  AVS completed.  Copy for chart, and copy for patient signed, and dated. Patient/caregiver able to verbalize understanding.  Discharge Medication: Allergies as of 01/12/2024   No Known Allergies      Medication List     STOP taking these medications    dapagliflozin  propanediol 10 MG Tabs tablet Commonly known as: FARXIGA    feeding supplement Liqd       TAKE these medications    acetaminophen  325 MG tablet Commonly known as: TYLENOL  Take 2 tablets (650 mg total) by mouth every 6 (six) hours as needed for mild pain (pain score 1-3), moderate pain (pain score 4-6), fever or headache (or Fever >/= 101).   aspirin  EC 81 MG tablet Take 1 tablet (81 mg total) by mouth daily. Swallow whole.   atorvastatin  40 MG tablet Commonly known as: LIPITOR Take 1 tablet (40 mg total) by mouth at bedtime.   carvedilol  3.125 MG tablet Commonly known as: COREG  Take 1 tablet (3.125 mg total) by mouth 2 (two) times daily with a meal.   doxycycline  100 MG tablet Commonly known as: VIBRA -TABS Take 1 tablet (100 mg total) by mouth every 12 (twelve) hours for 2 days.   DULoxetine  30 MG capsule Commonly known as: CYMBALTA  Take 1 capsule by mouth daily.   fluconazole 150 MG tablet Commonly known as: DIFLUCAN Take 1 tablet (150 mg total) by mouth once for 1 dose. Start taking on: Jan 13, 2024   folic acid  1 MG tablet Commonly known as: FOLVITE  Take 1 tablet (1 mg total) by mouth daily.   furosemide  20 MG tablet Commonly known as: LASIX  Take 1 tablet (20 mg total) by mouth daily as needed for fluid. Weight > 3 lb in a 24 hour period.   gabapentin  300 MG capsule Commonly known as: NEURONTIN  Take 300 mg by mouth 4 (four) times daily.   losartan  25 MG tablet Commonly known as: COZAAR  Take 0.5 tablets (12.5 mg total) by mouth daily.   multivitamin with minerals  Tabs tablet Take 1 tablet by mouth daily.   nicotine  7 mg/24hr patch Commonly known as: NICODERM CQ  - dosed in mg/24 hr Place 1 patch (7 mg total) onto the skin daily.   nystatin-triamcinolone cream Commonly known as: MYCOLOG II Apply topically 2 (two) times daily.   spironolactone  25 MG tablet Commonly known as: ALDACTONE  Take 0.5 tablets (12.5 mg total) by mouth daily.   thiamine  100 MG tablet Commonly known as: VITAMIN B1 Take 1 tablet (100 mg total) by mouth daily.   tiZANidine 2 MG tablet Commonly known as: ZANAFLEX Take 2 mg by mouth every 8 (eight) hours as needed for muscle spasms.        Discharge Assessment: Vitals:   01/12/24 0758 01/12/24 1214  BP: 97/76 109/82  Pulse: 99 87  Resp:    Temp: 98.7 F (37.1 C) 98.5 F (36.9 C)  SpO2: 99% 100%   Skin clean, dry and intact without evidence of skin break down, no evidence of skin tears noted. IV catheter discontinued intact. Site without signs and symptoms of complications - no redness or edema noted at insertion site, patient denies c/o pain - only slight tenderness at site.  Dressing with slight pressure applied.  D/c Instructions-Education: Discharge instructions given to patient/family with verbalized understanding. D/c education completed with patient/family including follow up instructions, medication list, d/c activities  limitations if indicated, with other d/c instructions as indicated by MD - patient able to verbalize understanding, all questions fully answered. Patient instructed to return to ED, call 911, or call MD for any changes in condition.  Patient escorted via WC, and D/C home via private auto.  Tamara Fairy, RN 01/12/2024 1:47 PM

## 2024-01-12 NOTE — Plan of Care (Signed)
  Problem: Education: Goal: Knowledge of General Education information will improve Description: Including pain rating scale, medication(s)/side effects and non-pharmacologic comfort measures Outcome: Progressing   Problem: Clinical Measurements: Goal: Ability to maintain clinical measurements within normal limits will improve Outcome: Progressing   Problem: Clinical Measurements: Goal: Will remain free from infection Outcome: Progressing   Problem: Clinical Measurements: Goal: Diagnostic test results will improve Outcome: Progressing   Problem: Clinical Measurements: Goal: Respiratory complications will improve Outcome: Progressing   Problem: Clinical Measurements: Goal: Cardiovascular complication will be avoided Outcome: Progressing   Problem: Nutrition: Goal: Adequate nutrition will be maintained Outcome: Progressing   Problem: Activity: Goal: Risk for activity intolerance will decrease Outcome: Progressing   Problem: Coping: Goal: Level of anxiety will decrease Outcome: Progressing   Problem: Metabolic: Goal: Ability to maintain appropriate glucose levels will improve Outcome: Progressing   Problem: Nutritional: Goal: Maintenance of adequate nutrition will improve Outcome: Progressing   Problem: Skin Integrity: Goal: Risk for impaired skin integrity will decrease Outcome: Progressing   Plan of care ongoing, see, MAR see flowsheet

## 2024-01-12 NOTE — Progress Notes (Signed)
 Rounding Note    Patient Name: Penny Gill Date of Encounter: 01/12/2024  [facility] Cardiologist: Antionette Kirks, MD   Subjective   Patient reports feeling back to her baseline. She denies chest pain and shortness of breath. She does report some perineal itching caused by her Farxiga . She reports she experienced this prior to hospitalization as well.   Inpatient Medications    Scheduled Meds:  aspirin  EC  81 mg Oral Daily   atorvastatin   40 mg Oral QHS   carvedilol   3.125 mg Oral BID WC   Chlorhexidine  Gluconate Cloth  6 each Topical Daily   doxycycline   100 mg Oral Q12H   DULoxetine   30 mg Oral Daily   enoxaparin  (LOVENOX ) injection  30 mg Subcutaneous Q24H   feeding supplement  237 mL Oral TID BM   folic acid   1 mg Oral Daily   insulin  aspart  0-15 Units Subcutaneous Q4H   losartan   12.5 mg Oral Daily   multivitamin with minerals  1 tablet Oral Daily   sodium chloride  flush  3 mL Intravenous Q12H   spironolactone   12.5 mg Oral Daily   thiamine   100 mg Oral Daily   Continuous Infusions:   PRN Meds: acetaminophen , ipratropium-albuterol , ondansetron  (ZOFRAN ) IV, sodium chloride  flush, traZODone    Vital Signs    Vitals:   01/12/24 0000 01/12/24 0450 01/12/24 0504 01/12/24 0758  BP:  130/79  97/76  Pulse:  (!) 101  99  Resp: 14 18    Temp:  98.6 F (37 C)  98.7 F (37.1 C)  TempSrc:    Oral  SpO2:  99%  99%  Weight:   33.4 kg   Height:       No intake or output data in the 24 hours ending 01/12/24 0936     01/12/2024    5:04 AM 01/11/2024    6:00 AM 01/09/2024    3:50 AM  Last 3 Weights  Weight (lbs) 73 lb 11.2 oz 83 lb 74 lb 8.3 oz  Weight (kg) 33.43 kg 37.649 kg 33.8 kg      Telemetry    Sinus rhythm rate 90-120 bpm - Personally Reviewed  Physical Exam   GEN: Frail appearing. No acute distress.   Neck: No JVD Cardiac: RRR, no murmurs, rubs, or gallops.  Respiratory: Clear to auscultation bilaterally. GI: Soft, nontender,  non-distended  MS: No edema; No deformity. Neuro:  Nonfocal  Psych: Normal affect   Labs    High Sensitivity Troponin:   Recent Labs  Lab 01/05/24 1818 01/07/24 1958 01/07/24 2207 01/08/24 0635 01/08/24 0930  TROPONINIHS 380* 135* 1,015* 4,247* 3,994*     Chemistry Recent Labs  Lab 01/07/24 1958 01/08/24 5409 01/09/24 0431 01/10/24 0434 01/11/24 0425 01/12/24 0456  NA 135 140   < > 141 139 137  K 4.5 4.2   < > 4.0 4.4 4.8  CL 104 105   < > 108 107 103  CO2 18* 25   < > 24 24 23   GLUCOSE 292* 122*   < > 98 119* 103*  BUN 32* 31*   < > 21 19 28*  CREATININE 1.41* 1.09*   < > 0.69 0.68 0.66  CALCIUM  7.9* 8.3*   < > 8.5* 8.8* 9.5  MG 2.1  --    < > 2.0 1.8 2.1  PROT 5.4* 5.9*  --  5.9*  --   --   ALBUMIN 2.4* 2.6*  --  2.4*  --   --  AST 677* 289*  --  40  --   --   ALT 321* 306*  --  120*  --   --   ALKPHOS 269* 261*  --  179*  --   --   BILITOT 1.0 0.7  --  0.6  --   --   GFRNONAA 42* 57*   < > >60 >60 >60  ANIONGAP 13 10   < > 9 8 11    < > = values in this interval not displayed.    Lipids No results for input(s): "CHOL", "TRIG", "HDL", "LABVLDL", "LDLCALC", "CHOLHDL" in the last 168 hours.  Hematology Recent Labs  Lab 01/09/24 0431 01/10/24 0434 01/11/24 0425  WBC 7.7 7.4 6.9  RBC 3.70* 3.93 3.97  HGB 11.0* 11.6* 11.6*  HCT 33.6* 34.8* 35.4*  MCV 90.8 88.5 89.2  MCH 29.7 29.5 29.2  MCHC 32.7 33.3 32.8  RDW 12.4 12.2 12.3  PLT 253 299 316   Thyroid   Recent Labs  Lab 01/07/24 2207  TSH 1.529    BNP Recent Labs  Lab 01/05/24 1438  BNP 1,852.3*    DDimer  Recent Labs  Lab 01/07/24 1958  DDIMER 1.79*    Cardiac Studies   TTE 01/09/2024  1. Left ventricular ejection fraction, by estimation, is 25 to 30%. The  left ventricle has severely decreased function. The left ventricle  demonstrates global hypokinesis. The left ventricular internal cavity size  was mildly dilated. Left ventricular  diastolic function could not be evaluated.   2.  Right ventricular systolic function is mildly reduced. The right  ventricular size is normal. There is mildly elevated pulmonary artery  systolic pressure. The estimated right ventricular systolic pressure is  41.4 mmHg.   3. Left atrial size was severely dilated.   4. PFO is present by color doppler but bubble is negative. Suspect  elevated LAP is reason fro negative bubble. Agitated saline contrast  bubble study was negative, with no evidence of any interatrial shunt.  There is a small patent foramen ovale with  predominantly left to right shunting across the atrial septum.   5. Right atrial size was mildly dilated.   6. Severe MR is present due to restricted PMVL from cardiomyopathy. The  mitral valve is abnormal. Severe mitral valve regurgitation. No evidence  of mitral stenosis.   7. The aortic valve is tricuspid. Aortic valve regurgitation is not  visualized. No aortic stenosis is present.   8. The inferior vena cava is normal in size with greater than 50%  respiratory variability, suggesting right atrial pressure of 3 mmHg.    LHC/RHC 12/22/2023 No angiographically significant coronary artery disease.  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 Normal left and right heart filling pressures (RA 3, RV 26/3, PCWP 8, LVEDP 10 mmHg). Upper normal pulmonary artery pressure with elevated vascular resistance (PA 27/14, mean 18 mmHg; PVR 4 WU). Mildly reduced Fick cardiac index (CO 2.5 L/min, CI 2.1 L/min/m^2).  Patient Profile     63 y.o. female with history of cocaine abuse, systolic heart failure, diabetes, hypertension, COPD admitted on 01/05/2024 with acute on chronic systolic heart failure. Course complicated by hypotension and shock secondary to hypovolemia and diarrhea. Cardiology was consulted for further management.   Assessment & Plan    Acute on chronic systolic heart failure, EF 25-30% Severe mitral valve regurgitation Hypovolemic shock, resolved - Initially  admitted with acute heart failure.  Underwent diuresis and had diarrhea. Suffered from shock which was attributed to hypovolemia. She  was briefly on pressors in the ICU and her overall condition has now improved.  - Blood pressure is improved, no signs of cardiogenic shock - Nonischemic cardiomyopathy with prior left heart cath showing normal coronaries.  Likely related to cocaine abuse. 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  The posterior mitral valve leaflet is restricted. - Recommend medical management at this time with ongoing cocaine abuse, she is not a candidate for advanced therapies - Appears euvolemic on exam, no indication for further diuresis at this time - Will discontinue Farxiga  given patient concern - Also discontinue digoxin , patient is not a good candidate for long term use - Continue carvedilol  and spironolactone  - Will add losartan  12.5 mg daily to further optimize GDMT  NSVT - No further evidence on telemetry - Recent cath with clear coronary arteries - Continue to monitor  Possible PNA - Antibiotics per IM  AKI Acute liver injury - Improving, continue to monitor  Elevated troponin - Demand ischemia in the setting of shock and acute on chronic HF - Recent cath with normal coronary arteries - No plan for further ischemic evaluation at this time  Cocaine abuse - Continue to recommend complete cessation  For questions or updates, please contact [facility] Please consult [email or web address] for contact info under        Signed, Brodie Cannon, PA-C  01/12/2024, 9:36 AM

## 2024-01-12 NOTE — Discharge Instructions (Signed)
 Follow up with PCP and pick up medications from pharmacy. Make sure to take all antibiotics. Do not double up on narcotic/pain medication or drink alcohol during this time. Do not drive while taking narcotic medication. Call 911 or return to ER for life threatening issues or other concerns.

## 2024-01-12 NOTE — Progress Notes (Signed)
 Heart Failure Stewardship Pharmacy Note  PCP: Pcp, No PCP-Cardiologist: Antionette Kirks, MD  HPI: Penny Gill is a 63 y.o. female with cocaine and tobacco abuse, HTN, HLD, DM, COPD not on O2, depression, chronic pain, sickle cell trait who presented with shortness of breath worsening over several days. On admission, BNP was 1852.3, HS-troponin was 235 > 380, and USD was positive for cocaine. Patient has been febrile up to 103.37F with WBC of 14.9. Urinalysis was not a clean catch given squamous epithelial cells present. Blood cultures pending. Chest x-ray noted increasing lung base parenchymal interstitial opacities.   Pertinent cardiac history: Echocardiogram 10/2023 noted LVEF reduced to 30-35% with mild LVH, global hypokinesis, grade I diastolic dysfunction, mild MR. RHC on 12/22/23 showed RA 3, RV 26/3, PCWP 8, LVEDP 10 mmHg, CI 2.1 L/min/m2. LHC 12/22/23 was negative for coronary disease.     Pertinent Lab Values: Creatinine  Date Value Ref Range Status  07/05/2014 1.11 0.60 - 1.30 mg/dL Final   Creatinine, Ser  Date Value Ref Range Status  01/12/2024 0.66 0.44 - 1.00 mg/dL Final   BUN  Date Value Ref Range Status  01/12/2024 28 (H) 8 - 23 mg/dL Final  16/05/9603 18 7 - 18 mg/dL Final   Potassium  Date Value Ref Range Status  01/12/2024 4.8 3.5 - 5.1 mmol/L Final  07/05/2014 3.8 3.5 - 5.1 mmol/L Final   Sodium  Date Value Ref Range Status  01/12/2024 137 135 - 145 mmol/L Final  07/05/2014 141 136 - 145 mmol/L Final   B Natriuretic Peptide  Date Value Ref Range Status  01/05/2024 1,852.3 (H) 0.0 - 100.0 pg/mL Final    Comment:    Performed at [facility], [address], [place], [place] [address]   Magnesium   Date Value Ref Range Status  01/12/2024 2.1 1.7 - 2.4 mg/dL Final    Comment:    Performed at [facility], [address] [place], [place] [address]   Hgb A1c MFr Bld  Date Value Ref Range Status  11/15/2023 5.8 (H) 4.8 - 5.6 % Final     Comment:    (NOTE)         Prediabetes: 5.7 - 6.4         Diabetes: >6.4         Glycemic control for adults with diabetes: <7.0    Digoxin  Level  Date Value Ref Range Status  01/12/2024 0.8 0.8 - 2.0 ng/mL Final    Comment:    Performed at [facility], [address] [place], [place] [address]   TSH  Date Value Ref Range Status  01/07/2024 1.529 0.350 - 4.500 uIU/mL Final    Comment:    Performed by a 3rd Generation assay with a functional sensitivity of <=0.01 uIU/mL. Performed at [facility], [address] [place], [place] [address]    Vital Signs:  Temp:  [98 F (36.7 C)-99.1 F (37.3 C)] 98.7 F (37.1 C) (05/20 0758) Pulse Rate:  [96-107] 99 (05/20 0758) Cardiac Rhythm: Normal sinus rhythm (05/20 0700) Resp:  [14-20] 18 (05/20 0450) BP: (97-137)/(76-107) 97/76 (05/20 0758) SpO2:  [99 %-100 %] 99 % (05/20 0758) Weight:  [33.4 kg (73 lb 11.2 oz)] 33.4 kg (73 lb 11.2 oz) (05/20 0504)  Intake/Output Summary (Last 24 hours) at 01/12/2024 1038 Last data filed at 01/12/2024 0900 Gross per 24 hour  Intake 240 ml  Output --  Net 240 ml    Current Heart Failure Medications:  Loop diuretic: none Beta-Blocker: carvedilol  3.125 mg BID ACEI/ARB/ARNI: losartan  12.5 mg daily MRA: spironolactone  12.5 mg daily SGLT2i: none Other: digoxin  0.125 mg daily  Prior to admission Heart Failure Medications:  Loop diuretic: furosemide  20 mg prn Beta-Blocker: carvedilol  3.125 mg BID ACEI/ARB/ARNI: losartan  12.5 mg daily MRA: spironolactone  12.5 mg daily SGLT2i: Farxiga  10 mg daily  Assessment: 1. 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 NYHA class IV symptoms.  -Symptoms: Patient reports fatigue and lethargy have resolved and she is feeling back to baseline. Appetite is fine. Reports perineal itching and requests discontinuation of SGLT2i. -Volume: Appears euvolemic. Creatinine stable. Not currently  requiring diuretics. Suspect she will not need scheduled diuretics at discharge. -Hemodynamics: BP had been normal to high yesterday with one lower reading this AM. HR 80-90s.    -BB: PTA carvedilol  3.125 mg BID resumed. Cocaine use and COPD noted.  -ACEI/ARB/ARNI: Losartan  12.5 mg daily resumed. AM dose delayed due to reading of 97/76 mmHg. If recheck is elevated again, would likely be fine to restart given patient is asymptomatic. -MRA: Continue spironolactone  12.5 mg daily. K up to 4.8 today. -SGLT2i: Continue Farxiga  10 mg daily.    Plan: 1) Medication changes recommended at this time: -None.   2) Patient assistance: -Entresto and Farxiga  are $4  3) Education: - Patient has been educated on current HF medications and potential additions to HF medication regimen - Patient verbalizes understanding that over the next few months, these medication doses may change and more medications may be added to optimize HF regimen - Patient has been educated on basic disease state pathophysiology and goals of therapy  Medication Assistance / Insurance Benefits Check: Does the patient have prescription insurance?    Type of insurance plan:  Does the patient qualify for medication assistance through manufacturers or grants? Pending   Outpatient Pharmacy: Prior to admission outpatient pharmacy: [facility]     Please do not hesitate to reach out with questions or concerns,  Bevely Brush, PharmD, CPP, BCPS Heart Failure Pharmacist  Phone - [phone] 01/12/2024 10:38 AM

## 2024-01-13 ENCOUNTER — Encounter: Payer: MEDICAID | Admitting: Family

## 2024-01-14 ENCOUNTER — Telehealth: Payer: Self-pay | Admitting: Internal Medicine

## 2024-01-14 DIAGNOSIS — G959 Disease of spinal cord, unspecified: Secondary | ICD-10-CM | POA: Insufficient documentation

## 2024-01-15 ENCOUNTER — Telehealth: Payer: Self-pay | Admitting: Cardiology

## 2024-01-15 NOTE — Telephone Encounter (Signed)
 Called to confirm/remind patient of their appointment at the [facility] on 01/19/24.   Appointment:   [x] Confirmed  [] Left mess   [] No answer/No voice mail  [] VM Full/unable to leave message  [] Phone not in service  Patient reminded to bring all medications and/or complete list.  Confirmed patient has transportation. Gave directions, instructed to utilize valet parking.
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
## 2024-01-15 NOTE — Telephone Encounter (Signed)
 error

## 2024-01-19 ENCOUNTER — Encounter: Payer: MEDICAID | Admitting: Family

## 2024-01-19 ENCOUNTER — Encounter: Payer: Self-pay | Admitting: Family

## 2024-01-19 ENCOUNTER — Ambulatory Visit: Payer: MEDICAID | Attending: Family | Admitting: Family

## 2024-01-19 VITALS — BP 109/85 | HR 78 | Wt 77.4 lb

## 2024-01-19 DIAGNOSIS — M542 Cervicalgia: Secondary | ICD-10-CM | POA: Diagnosis not present

## 2024-01-19 DIAGNOSIS — I10 Essential (primary) hypertension: Secondary | ICD-10-CM | POA: Diagnosis not present

## 2024-01-19 DIAGNOSIS — D573 Sickle-cell trait: Secondary | ICD-10-CM | POA: Diagnosis not present

## 2024-01-19 DIAGNOSIS — Z5986 Financial insecurity: Secondary | ICD-10-CM | POA: Insufficient documentation

## 2024-01-19 DIAGNOSIS — E785 Hyperlipidemia, unspecified: Secondary | ICD-10-CM | POA: Diagnosis not present

## 2024-01-19 DIAGNOSIS — E1140 Type 2 diabetes mellitus with diabetic neuropathy, unspecified: Secondary | ICD-10-CM | POA: Insufficient documentation

## 2024-01-19 DIAGNOSIS — I428 Other cardiomyopathies: Secondary | ICD-10-CM | POA: Diagnosis not present

## 2024-01-19 DIAGNOSIS — Z79899 Other long term (current) drug therapy: Secondary | ICD-10-CM | POA: Diagnosis not present

## 2024-01-19 DIAGNOSIS — I340 Nonrheumatic mitral (valve) insufficiency: Secondary | ICD-10-CM | POA: Diagnosis not present

## 2024-01-19 DIAGNOSIS — J439 Emphysema, unspecified: Secondary | ICD-10-CM | POA: Insufficient documentation

## 2024-01-19 DIAGNOSIS — I5022 Chronic systolic (congestive) heart failure: Secondary | ICD-10-CM | POA: Insufficient documentation

## 2024-01-19 DIAGNOSIS — I110 Hypertensive heart disease with heart failure: Secondary | ICD-10-CM | POA: Insufficient documentation

## 2024-01-19 DIAGNOSIS — G8929 Other chronic pain: Secondary | ICD-10-CM | POA: Insufficient documentation

## 2024-01-19 DIAGNOSIS — F1910 Other psychoactive substance abuse, uncomplicated: Secondary | ICD-10-CM

## 2024-01-19 MED ORDER — TIZANIDINE HCL 2 MG PO TABS: 2.0000 mg | ORAL_TABLET | Freq: Three times a day (TID) | ORAL | 0 refills | Status: DC | PRN

## 2024-01-19 MED ORDER — ATORVASTATIN CALCIUM 40 MG PO TABS: 40.0000 mg | ORAL_TABLET | Freq: Every day | ORAL | 5 refills | Status: DC

## 2024-01-19 MED ORDER — CARVEDILOL 3.125 MG PO TABS: 3.1250 mg | ORAL_TABLET | Freq: Two times a day (BID) | ORAL | 5 refills | Status: DC

## 2024-01-19 MED ORDER — SPIRONOLACTONE 25 MG PO TABS: 12.5000 mg | ORAL_TABLET | Freq: Every day | ORAL | 5 refills | Status: DC

## 2024-01-19 MED ORDER — FUROSEMIDE 20 MG PO TABS: 20.0000 mg | ORAL_TABLET | Freq: Every day | ORAL | 5 refills | Status: DC | PRN

## 2024-01-19 MED ORDER — LOSARTAN POTASSIUM 25 MG PO TABS: 12.5000 mg | ORAL_TABLET | Freq: Every day | ORAL | 5 refills | Status: DC

## 2024-01-19 NOTE — Patient Instructions (Addendum)
 Medication Changes:  No medication changes.   Lab Work:  Go DOWN to LOWER LEVEL (LL) to have your blood work completed inside of [employer] office.  We will only call you if the results are abnormal or if the provider would like to make medication changes.   Referrals:  We have placed a referral today to General Cardiology, as they saw you in the hospital. They should reach out to you within 1-2 weeks. If they do not, please reach out to them. Their information is below on this AVS. You can also go ahead and schedule with them while you're down there to get your lab work done.   Follow-Up in: 3-4 weeks with Penny Dellen, Penny Gill.  At the [facility], you and your health needs are our priority. We have a designated team specialized in the treatment of Heart Failure. This Care Team includes your primary Heart Failure Specialized Cardiologist (physician), Advanced Practice Providers (APPs- Physician Assistants and Nurse Practitioners), and Pharmacist who all work together to provide you with the care you need, when you need it.   You may see any of the following providers on your designated Care Team at your next follow up:  Dr. Jules Oar Dr. Peder Bourdon Dr. Alwin Baars Dr. Judyth Nunnery Penny Dellen, Penny Gill Bevely Brush, RPH-CPP  Please be sure to bring in all your medications bottles to every appointment.   Need to Contact Us :  If you have any questions or concerns before your next appointment please send us  a message through [place] or call our office at [phone].    TO LEAVE A MESSAGE FOR THE NURSE SELECT OPTION 2, PLEASE LEAVE A MESSAGE INCLUDING: YOUR NAME DATE OF BIRTH CALL BACK NUMBER REASON FOR CALL**this is important as we prioritize the call backs  YOU WILL RECEIVE A CALL BACK THE SAME DAY AS LONG AS YOU CALL BEFORE 4:00 PM

## 2024-01-20 ENCOUNTER — Ambulatory Visit: Payer: Self-pay | Admitting: Family

## 2024-01-20 ENCOUNTER — Ambulatory Visit: Payer: MEDICAID | Attending: Sleep Medicine

## 2024-01-20 DIAGNOSIS — I110 Hypertensive heart disease with heart failure: Secondary | ICD-10-CM | POA: Diagnosis not present

## 2024-01-20 DIAGNOSIS — G709 Myoneural disorder, unspecified: Secondary | ICD-10-CM | POA: Insufficient documentation

## 2024-01-20 DIAGNOSIS — R400 Somnolence: Secondary | ICD-10-CM | POA: Insufficient documentation

## 2024-01-20 DIAGNOSIS — I509 Heart failure, unspecified: Secondary | ICD-10-CM | POA: Insufficient documentation

## 2024-01-20 DIAGNOSIS — I5022 Chronic systolic (congestive) heart failure: Secondary | ICD-10-CM

## 2024-01-20 LAB — BASIC METABOLIC PANEL WITH GFR
BUN/Creatinine Ratio: 24 (ref 12–28)
BUN: 20 mg/dL (ref 8–27)
CO2: 21 mmol/L (ref 20–29)
Calcium: 9.7 mg/dL (ref 8.7–10.3)
Chloride: 103 mmol/L (ref 96–106)
Creatinine, Ser: 0.83 mg/dL (ref 0.57–1.00)
Glucose: 75 mg/dL (ref 70–99)
Potassium: 5.4 mmol/L — ABNORMAL HIGH (ref 3.5–5.2)
Sodium: 141 mmol/L (ref 134–144)
eGFR: 80 mL/min/{1.73_m2} (ref >59)

## 2024-01-25 ENCOUNTER — Other Ambulatory Visit: Payer: Self-pay

## 2024-01-28 ENCOUNTER — Encounter: Payer: MEDICAID | Admitting: Family

## 2024-02-01 ENCOUNTER — Other Ambulatory Visit (HOSPITAL_COMMUNITY): Payer: Self-pay

## 2024-02-02 DIAGNOSIS — G4733 Obstructive sleep apnea (adult) (pediatric): Secondary | ICD-10-CM

## 2024-02-10 ENCOUNTER — Ambulatory Visit: Payer: Self-pay

## 2024-02-10 DIAGNOSIS — G4719 Other hypersomnia: Secondary | ICD-10-CM

## 2024-02-10 DIAGNOSIS — I5022 Chronic systolic (congestive) heart failure: Secondary | ICD-10-CM

## 2024-02-10 NOTE — Progress Notes (Signed)
 Pulmonary referral placed for CPAP.

## 2024-02-12 ENCOUNTER — Encounter: Payer: Self-pay | Admitting: Sleep Medicine

## 2024-02-12 ENCOUNTER — Ambulatory Visit: Payer: MEDICAID | Admitting: Sleep Medicine

## 2024-02-12 VITALS — BP 102/62 | HR 80 | Temp 97.1°F | Ht 60.0 in | Wt 77.0 lb

## 2024-02-12 DIAGNOSIS — G4733 Obstructive sleep apnea (adult) (pediatric): Secondary | ICD-10-CM | POA: Diagnosis not present

## 2024-02-12 DIAGNOSIS — F17210 Nicotine dependence, cigarettes, uncomplicated: Secondary | ICD-10-CM | POA: Diagnosis not present

## 2024-02-12 DIAGNOSIS — I10 Essential (primary) hypertension: Secondary | ICD-10-CM | POA: Diagnosis not present

## 2024-02-12 NOTE — Progress Notes (Signed)
 Name:Penny Gill MRN: [id number] DOB: 12-12-1960   CHIEF COMPLAINT:  EXCESSIVE DAYTIME SLEEPINESS   HISTORY OF PRESENT ILLNESS:  Penny Gill is a 63 y.o. w/ a h/o HTN, CHF, anxiety and depression who presents for c/o loud snoring and excessive daytime sleepiness which has been present for several years. Reports nocturnal awakenings due to nocturia, however does not have difficulty falling back to sleep. Denies any significant weight changes. Denies morning headaches, RLS symptoms, dream enactment, cataplexy, hypnagogic or hypnapompic hallucinations. Reports a family history of sleep apnea. Denies drowsy driving. Denies alcohol, tobacco or illicit drug use.   Split night study was ordered by patient's PCP recently, which revealed mild OSA (AHI 11, O2 nadir 84%).   Bedtime 9:30-10 pm Sleep onset 5-10 mins Rise time 9:30 am   EPWORTH SLEEP SCORE 12    02/12/2024    8:00 AM  Results of the Epworth flowsheet  Sitting and reading 3  Watching TV 3  Sitting, inactive in a public place (e.g. a theatre or a meeting) 0  As a passenger in a car for an hour without a break 2  Lying down to rest in the afternoon when circumstances permit 1  Sitting and talking to someone 0  Sitting quietly after a lunch without alcohol 3  In a car, while stopped for a few minutes in traffic 0  Total score 12     PAST MEDICAL HISTORY :   has a past medical history of COPD (chronic obstructive pulmonary disease) (HCC), Depression, Hyperlipidemia, Hypertension, Kidney stone, Osteoporosis, and Sickle cell trait (HCC).  has a past surgical history that includes Tubal ligation and RIGHT/LEFT HEART CATH AND CORONARY ANGIOGRAPHY (N/A, 12/22/2023). Prior to Admission medications   Medication Sig Start Date End Date Taking? Authorizing Provider  gabapentin  (NEURONTIN ) 300 MG capsule Take 300 mg by mouth 4 (four) times daily.   Yes [provider]  losartan  (COZAAR ) 25 MG tablet Take 0.5 tablets  (12.5 mg total) by mouth daily. 01/19/24  Yes Charlette Console, FNP  Multiple Vitamin (MULTIVITAMIN WITH MINERALS) TABS tablet Take 1 tablet by mouth daily. 12/24/23  Yes Alexander, Natalie, DO  nicotine  (NICODERM CQ  - DOSED IN MG/24 HR) 7 mg/24hr patch Place 1 patch (7 mg total) onto the skin daily. 12/24/23  Yes Alexander, Natalie, DO  spironolactone  (ALDACTONE ) 25 MG tablet Take 0.5 tablets (12.5 mg total) by mouth daily. 01/19/24  Yes Hackney, Brian Campanile A, FNP  thiamine  (VITAMIN B-1) 100 MG tablet Take 1 tablet (100 mg total) by mouth daily. 12/24/23  Yes Alexander, Natalie, DO  tiZANidine  (ZANAFLEX ) 2 MG tablet Take 1 tablet (2 mg total) by mouth every 8 (eight) hours as needed for muscle spasms. 01/19/24  Yes Hackney, Brian Campanile A, FNP  acetaminophen  (TYLENOL ) 325 MG tablet Take 2 tablets (650 mg total) by mouth every 6 (six) hours as needed for mild pain (pain score 1-3), moderate pain (pain score 4-6), fever or headache (or Fever >/= 101). 12/23/23   Alexander, Natalie, DO  aspirin  EC 81 MG tablet Take 1 tablet (81 mg total) by mouth daily. Swallow whole. Patient not taking: Reported on 01/19/2024 11/18/23   Montey Apa, DO  atorvastatin  (LIPITOR) 40 MG tablet Take 1 tablet (40 mg total) by mouth at bedtime. 01/19/24   Charlette Console, FNP  carvedilol  (COREG ) 3.125 MG tablet Take 1 tablet (3.125 mg total) by mouth 2 (two) times daily with a meal. 01/19/24   Hackney, Cleotis Daily,  FNP  DULoxetine  (CYMBALTA ) 30 MG capsule Take 1 capsule by mouth daily. 11/02/23 11/01/24  [provider]  folic acid  (FOLVITE ) 1 MG tablet Take 1 tablet (1 mg total) by mouth daily. Patient not taking: Reported on 01/19/2024 12/24/23   Alexander, Natalie, DO  furosemide  (LASIX ) 20 MG tablet Take 1 tablet (20 mg total) by mouth daily as needed for fluid. Weight > 3 lb in a 24 hour period. 01/19/24   Charlette Console, FNP  nystatin -triamcinolone  (MYCOLOG II) cream Apply topically 2 (two) times daily. Patient not taking: Reported on  02/12/2024 01/12/24   Alphonsus Jeans, MD   No Known Allergies  FAMILY HISTORY:  family history includes Breast cancer (age of onset: 60) in her maternal aunt. SOCIAL HISTORY:  reports that she has been smoking cigarettes and e-cigarettes. She has never used smokeless tobacco. She reports that she does not currently use alcohol. She reports that she does not currently use drugs after having used the following drugs: Marijuana and Cocaine.   Review of Systems:  Gen:  Denies  fever, sweats, chills weight loss  HEENT: Denies blurred vision, double vision, ear pain, eye pain, hearing loss, nose bleeds, sore throat Cardiac:  No dizziness, chest pain or heaviness, chest tightness,edema, No JVD Resp:   No cough, -sputum production, -shortness of breath,-wheezing, -hemoptysis,  Gi: Denies swallowing difficulty, stomach pain, nausea or vomiting, diarrhea, constipation, bowel incontinence Gu:  Denies bladder incontinence, burning urine Ext:   Denies Joint pain, stiffness or swelling Skin: Denies  skin rash, easy bruising or bleeding or hives Endoc:  Denies polyuria, polydipsia , polyphagia or weight change Psych:   Denies depression, insomnia or hallucinations  Other:  All other systems negative  VITAL SIGNS: BP 102/62 (BP Location: Right Arm, Cuff Size: Normal)   Pulse 80   Temp (!) 97.1 F (36.2 C)   Ht 5' (1.524 m)   Wt 77 lb (34.9 kg)   SpO2 95%   BMI 15.04 kg/m    Physical Examination:   General Appearance: No distress  EYES PERRLA, EOM intact.   NECK Supple, No JVD Pulmonary: normal breath sounds, No wheezing.  CardiovascularNormal S1,S2.  No m/r/g.   Abdomen: Benign, Soft, non-tender. Skin:   warm, no rashes, no ecchymosis  Extremities: normal, no cyanosis, clubbing. Neuro:without focal findings,  speech normal  PSYCHIATRIC: Mood, affect within normal limits.   ASSESSMENT AND PLAN  OSA I suspect that OSA is likely present due to clinical presentation. Discussed the  consequences of untreated sleep apnea. Advised not to drive drowsy for safety of patient and others. Will complete further evaluation with a home sleep study and follow up to review results.    HTN Stable, on current management. Following with PCP.    Patient  satisfied with Plan of action and management. All questions answered  I spent a total of 30 minutes reviewing chart data, face-to-face evaluation with the patient, counseling and coordination of care as detailed above.    Tylek Boney, M.D.  Sleep Medicine [facility]

## 2024-02-12 NOTE — Patient Instructions (Signed)
 Aaron Aas

## 2024-02-16 ENCOUNTER — Encounter: Payer: MEDICAID | Admitting: Family

## 2024-02-19 ENCOUNTER — Ambulatory Visit: Payer: Self-pay | Admitting: Family

## 2024-02-19 ENCOUNTER — Other Ambulatory Visit
Admission: RE | Admit: 2024-02-19 | Discharge: 2024-02-19 | Disposition: A | Discharge status: Home / Self Care | Payer: MEDICAID | Source: Home / Self Care | Attending: Family | Admitting: Family

## 2024-02-19 DIAGNOSIS — I5022 Chronic systolic (congestive) heart failure: Secondary | ICD-10-CM | POA: Insufficient documentation

## 2024-02-19 LAB — BASIC METABOLIC PANEL WITH GFR
Anion gap: 9 (ref 5–15)
BUN: 21 mg/dL (ref 8–23)
CO2: 23 mmol/L (ref 22–32)
Calcium: 8.9 mg/dL (ref 8.9–10.3)
Chloride: 109 mmol/L (ref 98–111)
Creatinine, Ser: 0.83 mg/dL (ref 0.44–1.00)
GFR, Estimated: >60 mL/min (ref >60)
Glucose, Bld: 120 mg/dL — ABNORMAL HIGH (ref 70–99)
Potassium: 4.3 mmol/L (ref 3.5–5.1)
Sodium: 141 mmol/L (ref 135–145)

## 2024-02-20 ENCOUNTER — Inpatient Hospital Stay
Admission: EM | Admit: 2024-02-20 | Discharge: 2024-02-22 | DRG: 291 | Disposition: A | Discharge status: Home / Self Care | Payer: MEDICAID | Attending: Internal Medicine | Admitting: Internal Medicine

## 2024-02-20 ENCOUNTER — Encounter: Payer: Self-pay | Admitting: Internal Medicine

## 2024-02-20 ENCOUNTER — Emergency Department: Payer: MEDICAID

## 2024-02-20 ENCOUNTER — Inpatient Hospital Stay (HOSPITAL_COMMUNITY)
Admit: 2024-02-20 | Discharge: 2024-02-20 | Disposition: A | Discharge status: Home / Self Care | Payer: MEDICAID | Attending: Internal Medicine | Admitting: Internal Medicine

## 2024-02-20 ENCOUNTER — Other Ambulatory Visit: Payer: Self-pay

## 2024-02-20 DIAGNOSIS — I5023 Acute on chronic systolic (congestive) heart failure: Secondary | ICD-10-CM | POA: Diagnosis present

## 2024-02-20 DIAGNOSIS — Z7982 Long term (current) use of aspirin: Secondary | ICD-10-CM | POA: Diagnosis not present

## 2024-02-20 DIAGNOSIS — F32A Depression, unspecified: Secondary | ICD-10-CM | POA: Diagnosis present

## 2024-02-20 DIAGNOSIS — F1410 Cocaine abuse, uncomplicated: Secondary | ICD-10-CM | POA: Diagnosis present

## 2024-02-20 DIAGNOSIS — R7989 Other specified abnormal findings of blood chemistry: Secondary | ICD-10-CM

## 2024-02-20 DIAGNOSIS — Z1152 Encounter for screening for COVID-19: Secondary | ICD-10-CM

## 2024-02-20 DIAGNOSIS — F17290 Nicotine dependence, other tobacco product, uncomplicated: Secondary | ICD-10-CM | POA: Diagnosis present

## 2024-02-20 DIAGNOSIS — E785 Hyperlipidemia, unspecified: Secondary | ICD-10-CM | POA: Diagnosis present

## 2024-02-20 DIAGNOSIS — R64 Cachexia: Secondary | ICD-10-CM | POA: Diagnosis present

## 2024-02-20 DIAGNOSIS — E119 Type 2 diabetes mellitus without complications: Secondary | ICD-10-CM | POA: Diagnosis present

## 2024-02-20 DIAGNOSIS — I10 Essential (primary) hypertension: Secondary | ICD-10-CM | POA: Diagnosis present

## 2024-02-20 DIAGNOSIS — M810 Age-related osteoporosis without current pathological fracture: Secondary | ICD-10-CM | POA: Diagnosis present

## 2024-02-20 DIAGNOSIS — G894 Chronic pain syndrome: Secondary | ICD-10-CM | POA: Diagnosis present

## 2024-02-20 DIAGNOSIS — G4733 Obstructive sleep apnea (adult) (pediatric): Secondary | ICD-10-CM | POA: Diagnosis present

## 2024-02-20 DIAGNOSIS — D573 Sickle-cell trait: Secondary | ICD-10-CM | POA: Diagnosis present

## 2024-02-20 DIAGNOSIS — F17210 Nicotine dependence, cigarettes, uncomplicated: Secondary | ICD-10-CM | POA: Diagnosis present

## 2024-02-20 DIAGNOSIS — Z720 Tobacco use: Secondary | ICD-10-CM

## 2024-02-20 DIAGNOSIS — I429 Cardiomyopathy, unspecified: Secondary | ICD-10-CM | POA: Diagnosis not present

## 2024-02-20 DIAGNOSIS — I110 Hypertensive heart disease with heart failure: Secondary | ICD-10-CM | POA: Diagnosis present

## 2024-02-20 DIAGNOSIS — I509 Heart failure, unspecified: Secondary | ICD-10-CM

## 2024-02-20 DIAGNOSIS — M47812 Spondylosis without myelopathy or radiculopathy, cervical region: Secondary | ICD-10-CM | POA: Diagnosis present

## 2024-02-20 DIAGNOSIS — J439 Emphysema, unspecified: Secondary | ICD-10-CM | POA: Diagnosis present

## 2024-02-20 DIAGNOSIS — Z681 Body mass index (BMI) 19 or less, adult: Secondary | ICD-10-CM | POA: Diagnosis not present

## 2024-02-20 DIAGNOSIS — I5031 Acute diastolic (congestive) heart failure: Secondary | ICD-10-CM | POA: Diagnosis not present

## 2024-02-20 DIAGNOSIS — J441 Chronic obstructive pulmonary disease with (acute) exacerbation: Secondary | ICD-10-CM | POA: Diagnosis present

## 2024-02-20 DIAGNOSIS — F1490 Cocaine use, unspecified, uncomplicated: Secondary | ICD-10-CM | POA: Diagnosis not present

## 2024-02-20 DIAGNOSIS — J449 Chronic obstructive pulmonary disease, unspecified: Secondary | ICD-10-CM | POA: Diagnosis present

## 2024-02-20 DIAGNOSIS — E43 Unspecified severe protein-calorie malnutrition: Secondary | ICD-10-CM | POA: Diagnosis present

## 2024-02-20 DIAGNOSIS — Z23 Encounter for immunization: Secondary | ICD-10-CM | POA: Diagnosis not present

## 2024-02-20 DIAGNOSIS — J9601 Acute respiratory failure with hypoxia: Secondary | ICD-10-CM | POA: Diagnosis present

## 2024-02-20 DIAGNOSIS — I428 Other cardiomyopathies: Secondary | ICD-10-CM | POA: Diagnosis present

## 2024-02-20 DIAGNOSIS — Z79899 Other long term (current) drug therapy: Secondary | ICD-10-CM

## 2024-02-20 DIAGNOSIS — J432 Centrilobular emphysema: Secondary | ICD-10-CM | POA: Diagnosis not present

## 2024-02-20 LAB — BLOOD GAS, ARTERIAL
Acid-Base Excess: 0.8 mmol/L (ref 0.0–2.0)
Bicarbonate: 25.3 mmol/L (ref 20.0–28.0)
Delivery systems: POSITIVE
Expiratory PAP: 5 cmH2O
FIO2: 80 %
Inspiratory PAP: 10 cmH2O
O2 Saturation: 100 %
Patient temperature: 37
pCO2 arterial: 39 mmHg (ref 32–48)
pH, Arterial: 7.42 (ref 7.35–7.45)
pO2, Arterial: 211 mmHg — ABNORMAL HIGH (ref 83–108)

## 2024-02-20 LAB — CBC WITH DIFFERENTIAL/PLATELET
Abs Immature Granulocytes: 0.02 10*3/uL (ref 0.00–0.07)
Basophils Absolute: 0.1 10*3/uL (ref 0.0–0.1)
Basophils Relative: 1 %
Eosinophils Absolute: 0.3 10*3/uL (ref 0.0–0.5)
Eosinophils Relative: 3 %
HCT: 43.5 % (ref 36.0–46.0)
Hemoglobin: 13.6 g/dL (ref 12.0–15.0)
Immature Granulocytes: 0 %
Lymphocytes Relative: 37 %
Lymphs Abs: 3.5 10*3/uL (ref 0.7–4.0)
MCH: 28.6 pg (ref 26.0–34.0)
MCHC: 31.3 g/dL (ref 30.0–36.0)
MCV: 91.6 fL (ref 80.0–100.0)
Monocytes Absolute: 0.5 10*3/uL (ref 0.1–1.0)
Monocytes Relative: 5 %
Neutro Abs: 5.1 10*3/uL (ref 1.7–7.7)
Neutrophils Relative %: 54 %
Platelets: 356 10*3/uL (ref 150–400)
RBC: 4.75 MIL/uL (ref 3.87–5.11)
RDW: 13.3 % (ref 11.5–15.5)
WBC: 9.4 10*3/uL (ref 4.0–10.5)
nRBC: 0 % (ref 0.0–0.2)

## 2024-02-20 LAB — URINE DRUG SCREEN, QUALITATIVE (ARMC ONLY)
Amphetamines, Ur Screen: NOT DETECTED
Barbiturates, Ur Screen: NOT DETECTED
Benzodiazepine, Ur Scrn: NOT DETECTED
Cannabinoid 50 Ng, Ur ~~LOC~~: NOT DETECTED
Cocaine Metabolite,Ur ~~LOC~~: POSITIVE — AB
MDMA (Ecstasy)Ur Screen: NOT DETECTED
Methadone Scn, Ur: NOT DETECTED
Opiate, Ur Screen: NOT DETECTED
Phencyclidine (PCP) Ur S: NOT DETECTED
Tricyclic, Ur Screen: NOT DETECTED

## 2024-02-20 LAB — COMPREHENSIVE METABOLIC PANEL WITH GFR
ALT: 86 U/L — ABNORMAL HIGH (ref 0–44)
AST: 120 U/L — ABNORMAL HIGH (ref 15–41)
Albumin: 3.4 g/dL — ABNORMAL LOW (ref 3.5–5.0)
Alkaline Phosphatase: 94 U/L (ref 38–126)
Anion gap: 9 (ref 5–15)
BUN: 18 mg/dL (ref 8–23)
CO2: 22 mmol/L (ref 22–32)
Calcium: 8.5 mg/dL — ABNORMAL LOW (ref 8.9–10.3)
Chloride: 108 mmol/L (ref 98–111)
Creatinine, Ser: 0.88 mg/dL (ref 0.44–1.00)
GFR, Estimated: >60 mL/min (ref >60)
Glucose, Bld: 227 mg/dL — ABNORMAL HIGH (ref 70–99)
Potassium: 4 mmol/L (ref 3.5–5.1)
Sodium: 139 mmol/L (ref 135–145)
Total Bilirubin: 0.9 mg/dL (ref 0.0–1.2)
Total Protein: 7 g/dL (ref 6.5–8.1)

## 2024-02-20 LAB — TROPONIN I (HIGH SENSITIVITY)
Troponin I (High Sensitivity): 24 ng/L — ABNORMAL HIGH (ref <18)
Troponin I (High Sensitivity): 35 ng/L — ABNORMAL HIGH (ref <18)
Troponin I (High Sensitivity): 46 ng/L — ABNORMAL HIGH (ref <18)
Troponin I (High Sensitivity): 41 ng/L — ABNORMAL HIGH (ref <18)

## 2024-02-20 LAB — BRAIN NATRIURETIC PEPTIDE: B Natriuretic Peptide: 1843.4 pg/mL — ABNORMAL HIGH (ref 0.0–100.0)

## 2024-02-20 LAB — CBC
HCT: 41.5 % (ref 36.0–46.0)
Hemoglobin: 13.4 g/dL (ref 12.0–15.0)
MCH: 28 pg (ref 26.0–34.0)
MCHC: 32.3 g/dL (ref 30.0–36.0)
MCV: 86.6 fL (ref 80.0–100.0)
Platelets: 339 10*3/uL (ref 150–400)
RBC: 4.79 MIL/uL (ref 3.87–5.11)
RDW: 13.2 % (ref 11.5–15.5)
WBC: 9.2 10*3/uL (ref 4.0–10.5)
nRBC: 0 % (ref 0.0–0.2)

## 2024-02-20 LAB — RESP PANEL BY RT-PCR (RSV, FLU A&B, COVID)  RVPGX2
Influenza A by PCR: NEGATIVE
Influenza B by PCR: NEGATIVE
Resp Syncytial Virus by PCR: NEGATIVE
SARS Coronavirus 2 by RT PCR: NEGATIVE

## 2024-02-20 LAB — MAGNESIUM: Magnesium: 2.3 mg/dL (ref 1.7–2.4)

## 2024-02-20 LAB — GLUCOSE, CAPILLARY: Glucose-Capillary: 305 mg/dL — ABNORMAL HIGH (ref 70–99)

## 2024-02-20 LAB — CREATININE, SERUM
Creatinine, Ser: 0.83 mg/dL (ref 0.44–1.00)
GFR, Estimated: >60 mL/min (ref >60)

## 2024-02-20 LAB — BLOOD GAS, VENOUS

## 2024-02-20 MED ORDER — ALBUTEROL SULFATE (2.5 MG/3ML) 0.083% IN NEBU
2.5000 mg | INHALATION_SOLUTION | RESPIRATORY_TRACT | Status: DC | PRN
Start: 2024-02-20 — End: 2024-02-23

## 2024-02-20 MED ORDER — ACETAMINOPHEN 650 MG RE SUPP: 650.0000 mg | Freq: Four times a day (QID) | RECTAL | Status: DC | PRN

## 2024-02-20 MED ORDER — ATORVASTATIN CALCIUM 20 MG PO TABS
40.0000 mg | ORAL_TABLET | Freq: Every day | ORAL | Status: DC
  Administered 2024-02-20 – 2024-02-21 (×2): 40 mg via ORAL
  Filled 2024-02-20 (×2): qty 2

## 2024-02-20 MED ORDER — GABAPENTIN 300 MG PO CAPS
300.0000 mg | ORAL_CAPSULE | Freq: Four times a day (QID) | ORAL | Status: DC
  Administered 2024-02-20: 300 mg via ORAL
  Filled 2024-02-20: qty 1

## 2024-02-20 MED ORDER — ONDANSETRON HCL 4 MG PO TABS: 4.0000 mg | ORAL_TABLET | Freq: Four times a day (QID) | ORAL | Status: DC | PRN

## 2024-02-20 MED ORDER — ADULT MULTIVITAMIN W/MINERALS CH
1.0000 | ORAL_TABLET | Freq: Every day | ORAL | Status: DC
  Administered 2024-02-20 – 2024-02-22 (×3): 1 via ORAL
  Filled 2024-02-20 (×3): qty 1

## 2024-02-20 MED ORDER — ONDANSETRON HCL 4 MG/2ML IJ SOLN: 4.0000 mg | Freq: Four times a day (QID) | INTRAMUSCULAR | Status: DC | PRN

## 2024-02-20 MED ORDER — FOLIC ACID 1 MG PO TABS
1.0000 mg | ORAL_TABLET | Freq: Every day | ORAL | Status: DC
  Administered 2024-02-20 – 2024-02-22 (×3): 1 mg via ORAL
  Filled 2024-02-20 (×3): qty 1

## 2024-02-20 MED ORDER — HEPARIN SODIUM (PORCINE) 5000 UNIT/ML IJ SOLN
5000.0000 [IU] | Freq: Three times a day (TID) | INTRAMUSCULAR | Status: DC
  Administered 2024-02-20 – 2024-02-22 (×8): 5000 [IU] via SUBCUTANEOUS
  Filled 2024-02-20 (×8): qty 1

## 2024-02-20 MED ORDER — DULOXETINE HCL 30 MG PO CPEP
30.0000 mg | ORAL_CAPSULE | Freq: Every day | ORAL | Status: DC
  Administered 2024-02-20 – 2024-02-22 (×3): 30 mg via ORAL
  Filled 2024-02-20 (×3): qty 1

## 2024-02-20 MED ORDER — NICOTINE 7 MG/24HR TD PT24
7.0000 mg | MEDICATED_PATCH | Freq: Every day | TRANSDERMAL | Status: DC
  Administered 2024-02-20 – 2024-02-22 (×3): 7 mg via TRANSDERMAL
  Filled 2024-02-20 (×3): qty 1

## 2024-02-20 MED ORDER — ASPIRIN 81 MG PO TBEC
81.0000 mg | DELAYED_RELEASE_TABLET | Freq: Every day | ORAL | Status: DC
  Administered 2024-02-20 – 2024-02-22 (×3): 81 mg via ORAL
  Filled 2024-02-20 (×3): qty 1

## 2024-02-20 MED ORDER — LOSARTAN POTASSIUM 25 MG PO TABS
12.5000 mg | ORAL_TABLET | Freq: Every day | ORAL | Status: DC
Start: 2024-02-20 — End: 2024-02-23
  Administered 2024-02-20 – 2024-02-22 (×2): 12.5 mg via ORAL
  Filled 2024-02-20 (×2): qty 1
  Filled 2024-02-20: qty 0.5

## 2024-02-20 MED ORDER — PNEUMOCOCCAL 20-VAL CONJ VACC 0.5 ML IM SUSY
0.5000 mL | PREFILLED_SYRINGE | INTRAMUSCULAR | Status: AC
  Administered 2024-02-22: 0.5 mL via INTRAMUSCULAR
  Filled 2024-02-20 (×2): qty 0.5

## 2024-02-20 MED ORDER — SPIRONOLACTONE 12.5 MG HALF TABLET
12.5000 mg | ORAL_TABLET | Freq: Every day | ORAL | Status: DC
  Administered 2024-02-20 – 2024-02-22 (×2): 12.5 mg via ORAL
  Filled 2024-02-20 (×4): qty 1

## 2024-02-20 MED ORDER — FUROSEMIDE 10 MG/ML IJ SOLN
60.0000 mg | Freq: Two times a day (BID) | INTRAMUSCULAR | Status: DC
  Administered 2024-02-20 – 2024-02-21 (×3): 60 mg via INTRAVENOUS
  Filled 2024-02-20 (×2): qty 6
  Filled 2024-02-20: qty 8

## 2024-02-20 MED ORDER — THIAMINE MONONITRATE 100 MG PO TABS
100.0000 mg | ORAL_TABLET | Freq: Every day | ORAL | Status: DC
  Administered 2024-02-20 – 2024-02-22 (×3): 100 mg via ORAL
  Filled 2024-02-20 (×3): qty 1

## 2024-02-20 MED ORDER — ACETAMINOPHEN 325 MG PO TABS
650.0000 mg | ORAL_TABLET | Freq: Four times a day (QID) | ORAL | Status: DC | PRN
  Administered 2024-02-20 – 2024-02-22 (×5): 650 mg via ORAL
  Filled 2024-02-20 (×5): qty 2

## 2024-02-20 MED ORDER — FUROSEMIDE 10 MG/ML IJ SOLN
20.0000 mg | Freq: Once | INTRAMUSCULAR | Status: AC
  Administered 2024-02-20: 20 mg via INTRAVENOUS
  Filled 2024-02-20: qty 4

## 2024-02-20 MED ORDER — CARVEDILOL 3.125 MG PO TABS
3.1250 mg | ORAL_TABLET | Freq: Two times a day (BID) | ORAL | Status: DC
  Administered 2024-02-21 – 2024-02-22 (×4): 3.125 mg via ORAL
  Filled 2024-02-20 (×4): qty 1

## 2024-02-20 NOTE — ED Provider Notes (Signed)
 I discussed with hospitalist for admission   Arlander [place], MD 02/20/24 [phone]

## 2024-02-20 NOTE — ED Provider Notes (Signed)
 [facility] Provider Note    Event Date/Time   First MD Initiated Contact with Patient 02/20/24 [phone]     (approximate)   History   Respiratory Distress  Level 5 caveat:  history/ROS limited by acute/critical illness  HPI Penny Gill is a 63 y.o. female whose medical history includes CHF, COPD, history of cocaine abuse, history of elevated troponin, and chronic pain syndrome.  She presents by EMS for evaluation of severe shortness of breath.  She reports that it started this morning.  She tried using her albuterol  inhaler but it did not help.  When EMS arrived they found her in severe distress with a room air oxygen saturation of about 72%.  She received 4 DuoNeb treatments en route from home as well as magnesium  2 g IV and Solu-Medrol  125 mg IV.  She said that she feels a little bit better but not much.  No acute pain at this time.  No recent fever.     Physical Exam   ED Triage Vitals  Encounter Vitals Group     BP 02/20/24 0640 (!) 163/118     Girls Systolic BP Percentile --      Girls Diastolic BP Percentile --      Boys Systolic BP Percentile --      Boys Diastolic BP Percentile --      Pulse Rate 02/20/24 0640 (!) 115     Resp 02/20/24 0640 (!) 28     Temp --      Temp src --      SpO2 02/20/24 0640 91 %     Weight 02/20/24 0643 38.6 kg (85 lb)     Height 02/20/24 0643 1.524 m (5')     Head Circumference --      Peak Flow --      Pain Score --      Pain Loc --      Pain Education --      Exclude from Growth Chart --       Most recent vital signs: Vitals:   02/20/24 0640  BP: (!) 163/118  Pulse: (!) 115  Resp: (!) 28  SpO2: 91%    General: Awake, severe respiratory distress.  Alert and oriented. CV:  Good peripheral perfusion.  Tachycardia, regular rhythm, normal heart sounds. Resp:  Increased respiratory effort with intercostal retractions and accessory muscle usage.  Expiratory wheezing, decreased movement and crackles are  present all throughout lung fields. Abd:  Patient is cachectic, no tenderness to palpation of the abdomen. Other:  No peripheral edema, very thin extremities consistent with chronic malnutrition.   ED Results / Procedures / Treatments   Labs (all labs ordered are listed, but only abnormal results are displayed) Labs Reviewed  COMPREHENSIVE METABOLIC PANEL WITH GFR - Abnormal; Notable for the following components:      Result Value   Glucose, Bld 227 (*)    Calcium  8.5 (*)    Albumin 3.4 (*)    AST 120 (*)    ALT 86 (*)    All other components within normal limits  BRAIN NATRIURETIC PEPTIDE - Abnormal; Notable for the following components:   B Natriuretic Peptide 1,843.4 (*)    All other components within normal limits  BLOOD GAS, VENOUS - Abnormal; Notable for the following components:   pH, Ven 7.22 (*)    pCO2, Ven 35 (*)    pO2, Ven 31 (*)    Bicarbonate 14.3 (*)  Acid-base deficit 12.5 (*)    All other components within normal limits  TROPONIN I (HIGH SENSITIVITY) - Abnormal; Notable for the following components:   Troponin I (High Sensitivity) 24 (*)    All other components within normal limits  RESP PANEL BY RT-PCR (RSV, FLU A&B, COVID)  [id number]  CBC WITH DIFFERENTIAL/PLATELET  MAGNESIUM   URINE DRUG SCREEN, QUALITATIVE ([facility] ONLY)     EKG  ED ECG REPORT I, Darleene Dome, the attending physician, personally viewed and interpreted this ECG.  Date: 02/20/2024 EKG Time: 6:34 AM Rate: 120 Rhythm: Sinus tachycardia QRS Axis: normal Intervals: LVH, prolonged QT interval at 503 ms ST/T Wave abnormalities: Non-specific ST segment / T-wave changes, but no clear evidence of acute ischemia. Narrative Interpretation: no definitive evidence of acute ischemia; does not meet STEMI criteria.    RADIOLOGY See ED course for details   PROCEDURES:  Critical Care performed: Yes, see critical care procedure note(s)  .1-3 Lead EKG Interpretation  Performed by: Dome Darleene, MD Authorized by: Dome Darleene, MD     Interpretation: abnormal     ECG rate:  120   ECG rate assessment: tachycardic     Rhythm: sinus tachycardia     Ectopy: none     Conduction: normal   .Critical Care  Performed by: Dome Darleene, MD Authorized by: Dome Darleene, MD   Critical care provider statement:    Critical care time (minutes):  30   Critical care time was exclusive of:  Separately billable procedures and treating other patients   Critical care was necessary to treat or prevent imminent or life-threatening deterioration of the following conditions:  Respiratory failure   Critical care was time spent personally by me on the following activities:  Development of treatment plan with patient or surrogate, evaluation of patient's response to treatment, examination of patient, obtaining history from patient or surrogate, ordering and performing treatments and interventions, ordering and review of laboratory studies, ordering and review of radiographic studies, pulse oximetry, re-evaluation of patient's condition and review of old charts     IMPRESSION / MDM / ASSESSMENT AND PLAN / ED COURSE  I reviewed the triage vital signs and the nursing notes.                              Differential diagnosis includes, but is not limited to, COPD exacerbation, CHF exacerbation, pneumonia, continued substance use leading to myocardial injury, ACS, less likely PE.  Patient's presentation is most consistent with acute presentation with potential threat to life or bodily function.  Labs/studies ordered: Respiratory viral panel, VBG, CBC with differential, CMP, BNP, high-sensitivity troponin, magnesium , urine drug screen, chest x-ray, EKG  Interventions/Medications given:  Medications  furosemide  (LASIX ) injection 20 mg (20 mg Intravenous Given 02/20/24 0752)    (Note:  hospital course my include additional interventions and/or labs/studies not listed above.)   Patient in severe  distress upon arrival, hypoxic in the field, improving somewhat with nebulizer treatments.  I ordered BiPAP.  She has already received 4 DuoNebs, Solu-Medrol , and magnesium , so I will hold off on additional treatments now and see how she does with BiPAP.  She is cachectic with no evidence of volume overload, if anything she may be a bit volume depleted from insensible loss from the respiratory distress, so I doubt CHF exacerbation.  Lab workup and imaging are pending.  VBG to assess for possible CO2 retention.  Patient will require admission, but  she has no altered mental status and is protecting her airway at this time.  Urine drug screen is not an emergent need, but if she test positive for cocaine, it could provide an explanation for her exacerbation tonight as well as alterations of management (if she has evidence of cardiac injury, requiring beta-blockers, etc).  The patient is on the cardiac monitor to evaluate for evidence of arrhythmia and/or significant heart rate changes.   Clinical Course as of 02/20/24 0759  Sat Feb 20, 2024  0720 DG Chest Portable 1 View I independently viewed and interpret the patient's chest x-ray and it appears that she may be having a degree of interstitial or pulmonary edema.  Radiology confirmed most likely CHF exacerbation although pneumonia cannot be excluded.  However her presentation is not consistent with pneumonia at this time and it appears much more consistent with CHF as well as probable COPD [CF]  0730 Reassessed patient, she says she feels much better.  She is breathing much more comfortably now.  She admitted to crack cocaine use about 2 days ago.  She is not having pain.  Labs are not yet back but I will put in the hospitalist consult [CF]  0736 WBC: 9.4 [CF]  0737 I am consulting the hospitalist team for admission.   [CF]  0737 Troponin I (High Sensitivity)(!): 24 Minimal troponin elevation, likely demand ischemia [CF]  0738 Comprehensive metabolic  panel(!) Generally reassuring CMP, nonspecific LFT elevations but without abdominal pain/tenderness. [CF]  0749 B Natriuretic Peptide(!): 1,843.4 [CF]  0759 Transferring care to Dr. Arlander to discuss the case with the hospitalist. [CF]    Clinical Course User Index [CF] Gordan Huxley, MD     FINAL CLINICAL IMPRESSION(S) / ED DIAGNOSES   Final diagnoses:  Acute respiratory failure with hypoxia (HCC)  Acute on chronic congestive heart failure, unspecified heart failure type (HCC)  COPD with acute exacerbation (HCC)  Cocaine use  Elevated LFTs     Rx / DC Orders   ED Discharge Orders     None        Note:  This document was prepared using Dragon voice recognition software and may include unintentional dictation errors.   Gordan Huxley, MD 02/20/24 0800

## 2024-02-20 NOTE — ED Notes (Signed)
Pt ambulated to bedside commode.

## 2024-02-20 NOTE — Progress Notes (Signed)
  Echocardiogram 2D Echocardiogram has been performed.  Thedora GORMAN Louder 02/20/2024, 5:12 PM

## 2024-02-20 NOTE — H&P (Signed)
 History and Physical    Penny Gill FMW:[id number] DOB: Dec 30, 1960 DOA: 02/20/2024  PCP: [facility]  Patient coming from: home  I have personally briefly reviewed patient's old medical records in [facility]  Chief Complaint: sob  HPI: Penny Gill is a 63 y.o. female with medical history significant of  COPD, Depression, HTN, Sickle cell train, HLD, OSA on cpap at bedtime, Chronic pain,CHFref 30% ,history of cocaine abuse, DJD of the cervical spine who present to ED with acute respiratory distress. In the field patient was found to  have sat of 78% on RA( per patient she does not use O2 chronically. Patient was treated with douneb x4  and transferred to ED. Patient notes she was out of her carvedilol  x 2-3 days but states she was compliant with her lasix . She also noted cough and chills.  She notes she feels improved s/p treatment in ED.   ED Course:   Patient on arrival was in respiratory distress and transitioned to Bipap.  Patient was noted to have elevated BNP and cxr noting pulmonary edema.  Patient admitted with diagnosis of CHF exacerbation. Vitals: afeb BP163/118, HR 115, rr 28 , sat 91%   Na 141, K4.3, cl 109, bicarb 23, cr 0.83 Vbg 7.22/31 EKG:SNR, LAE, LVH QT503 RVP-neg Wbc 9.4, hgb 13.6, plt 356 Na 139, K 4, Cl 108, glu 227, cr 0.88, AST 120, ALT86 CE24 Mag 2.3 BNP 1843 Cxr IMPRESSION: 1. Interstitial and patchy airspace disease throughout the lung fields with a basal gradient, denser on the right. Given additional findings cardiomegaly and central vascular prominence, this is probably in total or at least mostly due to interstitial and alveolar pulmonary edema from CHF or fluid overload. Underlying pneumonia is possible in the proper clinical setting. 2. Small pleural effusions are beginning to form. 3. Increasing central bronchial thickening.      Review of Systems: As per HPI otherwise 10 point review of systems negative.   Past  Medical History:  Diagnosis Date   COPD (chronic obstructive pulmonary disease) (HCC)    emphysema   Depression    Hyperlipidemia    Hypertension    Kidney stone    Osteoporosis    Sickle cell trait (HCC)     Past Surgical History:  Procedure Laterality Date   RIGHT/LEFT HEART CATH AND CORONARY ANGIOGRAPHY N/A 12/22/2023   Procedure: RIGHT/LEFT HEART CATH AND CORONARY ANGIOGRAPHY;  Surgeon: Mady Bruckner, MD;  Location: [facility] INVASIVE CV LAB;  Service: Cardiovascular;  Laterality: N/A;   TUBAL LIGATION       reports that she has been smoking cigarettes and e-cigarettes. She has never used smokeless tobacco. She reports that she does not currently use alcohol. She reports that she does not currently use drugs after having used the following drugs: Marijuana and Cocaine.  No Known Allergies  Family History  Problem Relation Age of Onset   Breast cancer Maternal Aunt 40    Prior to Admission medications   Medication Sig Start Date End Date Taking? Authorizing Provider  acetaminophen  (TYLENOL ) 325 MG tablet Take 2 tablets (650 mg total) by mouth every 6 (six) hours as needed for mild pain (pain score 1-3), moderate pain (pain score 4-6), fever or headache (or Fever >/= 101). 12/23/23   Alexander, Natalie, DO  aspirin  EC 81 MG tablet Take 1 tablet (81 mg total) by mouth daily. Swallow whole. 11/18/23   Fausto Burnard LABOR, DO  atorvastatin  (LIPITOR) 40 MG tablet Take 1 tablet (  40 mg total) by mouth at bedtime. 01/19/24   Donette Ellouise LABOR, FNP  carvedilol  (COREG ) 3.125 MG tablet Take 1 tablet (3.125 mg total) by mouth 2 (two) times daily with a meal. 01/19/24   Donette Ellouise LABOR, FNP  DULoxetine  (CYMBALTA ) 30 MG capsule Take 1 capsule by mouth daily. 11/02/23 11/01/24  [provider]  folic acid  (FOLVITE ) 1 MG tablet Take 1 tablet (1 mg total) by mouth daily. 12/24/23   Alexander, Natalie, DO  furosemide  (LASIX ) 20 MG tablet Take 1 tablet (20 mg total) by mouth daily as needed for fluid.  Weight > 3 lb in a 24 hour period. 01/19/24   Donette Ellouise LABOR, FNP  gabapentin  (NEURONTIN ) 300 MG capsule Take 300 mg by mouth 4 (four) times daily.    [provider]  losartan  (COZAAR ) 25 MG tablet Take 0.5 tablets (12.5 mg total) by mouth daily. 01/19/24   Donette Ellouise LABOR, FNP  Multiple Vitamin (MULTIVITAMIN WITH MINERALS) TABS tablet Take 1 tablet by mouth daily. 12/24/23   Alexander, Natalie, DO  nicotine  (NICODERM CQ  - DOSED IN MG/24 HR) 7 mg/24hr patch Place 1 patch (7 mg total) onto the skin daily. 12/24/23   Alexander, Natalie, DO  nystatin -triamcinolone  (MYCOLOG II) cream Apply topically 2 (two) times daily. Patient not taking: Reported on 02/12/2024 01/12/24   Trudy Anthony HERO, MD  spironolactone  (ALDACTONE ) 25 MG tablet Take 0.5 tablets (12.5 mg total) by mouth daily. 01/19/24   Donette Ellouise LABOR, FNP  thiamine  (VITAMIN B-1) 100 MG tablet Take 1 tablet (100 mg total) by mouth daily. 12/24/23   Alexander, Natalie, DO  tiZANidine  (ZANAFLEX ) 2 MG tablet Take 1 tablet (2 mg total) by mouth every 8 (eight) hours as needed for muscle spasms. 01/19/24   Donette Ellouise LABOR, FNP    Physical Exam: Vitals:   02/20/24 0640 02/20/24 0643  BP: (!) 163/118   Pulse: (!) 115   Resp: (!) 28   SpO2: 91%   Weight:  38.6 kg  Height:  5' (1.524 m)    Constitutional: NAD, calm, comfortable/cachetic  Vitals:   02/20/24 0640 02/20/24 0643  BP: (!) 163/118   Pulse: (!) 115   Resp: (!) 28   SpO2: 91%   Weight:  38.6 kg  Height:  5' (1.524 m)   Eyes: pupils equal, lids and conjunctivae normal ENMT: Mucous membranes are moist. Posterior pharynx clear of any exudate or lesions.Normal dentition.  Neck: normal, supple, no masses, no thyromegaly Respiratory: + crackles  b/l\,Normal respiratory effort. No accessory muscle use.  Cardiovascular: Regular rate and rhythm, no murmurs / rubs / gallops. No extremity edema. 2+ pedal pulses.  Abdomen: no tenderness, no masses palpated. No hepatosplenomegaly. Bowel  sounds positive.  Musculoskeletal: no clubbing / cyanosis. No joint deformity upper and lower extremities. Good ROM, no contractures. Normal muscle tone.  Skin: no rashes, lesions, ulcers. No induration Neurologic: CN  grossly intact. Sensation intact,  Strength 5/5 in all 4.  Psychiatric: Normal judgment and insight. Alert and oriented x 3. Normal mood.    Labs on Admission: I have personally reviewed following labs and imaging studies  CBC: Recent Labs  Lab 02/20/24 0650  WBC 9.4  NEUTROABS 5.1  HGB 13.6  HCT 43.5  MCV 91.6  PLT 356   Basic Metabolic Panel: Recent Labs  Lab 02/19/24 1331 02/20/24 0650  NA 141 139  K 4.3 4.0  CL 109 108  CO2 23 22  GLUCOSE 120* 227*  BUN 21 18  CREATININE 0.83 0.88  CALCIUM  8.9 8.5*  MG  --  2.3   GFR: Estimated Creatinine Clearance: 40.4 mL/min (by C-G formula based on SCr of 0.88 mg/dL). Liver Function Tests: Recent Labs  Lab 02/20/24 0650  AST 120*  ALT 86*  ALKPHOS 94  BILITOT 0.9  PROT 7.0  ALBUMIN 3.4*   No results for input(s): LIPASE, AMYLASE in the last 168 hours. No results for input(s): AMMONIA in the last 168 hours. Coagulation Profile: No results for input(s): INR, PROTIME in the last 168 hours. Cardiac Enzymes: No results for input(s): CKTOTAL, CKMB, CKMBINDEX, TROPONINI in the last 168 hours. BNP (last 3 results) No results for input(s): PROBNP in the last 8760 hours. HbA1C: No results for input(s): HGBA1C in the last 72 hours. CBG: No results for input(s): GLUCAP in the last 168 hours. Lipid Profile: No results for input(s): CHOL, HDL, LDLCALC, TRIG, CHOLHDL, LDLDIRECT in the last 72 hours. Thyroid  Function Tests: No results for input(s): TSH, T4TOTAL, FREET4, T3FREE, THYROIDAB in the last 72 hours. Anemia Panel: No results for input(s): VITAMINB12, FOLATE, FERRITIN, TIBC, IRON, RETICCTPCT in the last 72 hours. Urine analysis:    Component  Value Date/Time   COLORURINE YELLOW (A) 01/05/2024 2346   APPEARANCEUR CLOUDY (A) 01/05/2024 2346   APPEARANCEUR Hazy (A) 12/30/2019 1042   LABSPEC 1.011 01/05/2024 2346   LABSPEC 1.011 07/05/2014 1255   PHURINE 6.0 01/05/2024 2346   GLUCOSEU >=500 (A) 01/05/2024 2346   GLUCOSEU Negative 07/05/2014 1255   HGBUR LARGE (A) 01/05/2024 2346   BILIRUBINUR NEGATIVE 01/05/2024 2346   BILIRUBINUR Negative 12/30/2019 1042   BILIRUBINUR Negative 07/05/2014 1255   KETONESUR NEGATIVE 01/05/2024 2346   PROTEINUR 100 (A) 01/05/2024 2346   NITRITE NEGATIVE 01/05/2024 2346   LEUKOCYTESUR LARGE (A) 01/05/2024 2346   LEUKOCYTESUR 1+ 07/05/2014 1255    Radiological Exams on Admission: DG Chest Portable 1 View Result Date: 02/20/2024 CLINICAL DATA:  Dyspnea and hypoxia. EXAM: PORTABLE CHEST 1 VIEW COMPARISON:  Portable chest 01/07/2024 FINDINGS: Interstitial and patchy airspace disease throughout the lung fields is noted with relative apical sparing and with a basal gradient, denser on the right. Given additional findings cardiomegaly and central vascular prominence, this is probably in total or at least mostly due to interstitial and alveolar pulmonary edema from CHF or fluid overload. There was a similar appearance on a portable chest from 12/20/2023 further suggesting an edematous etiology. Underlying pneumonia is possible in the proper clinical setting. Small pleural effusions are beginning to form. Central bronchi are increasingly thickened. The mediastinum is stable. No new osseous findings. Overlying monitor wires. IMPRESSION: 1. Interstitial and patchy airspace disease throughout the lung fields with a basal gradient, denser on the right. Given additional findings cardiomegaly and central vascular prominence, this is probably in total or at least mostly due to interstitial and alveolar pulmonary edema from CHF or fluid overload. Underlying pneumonia is possible in the proper clinical setting. 2. Small  pleural effusions are beginning to form. 3. Increasing central bronchial thickening. Electronically Signed   By: Francis Quam M.D.   On: 02/20/2024 07:21    EKG: Independently reviewed. See above  Assessment/Plan  CHFref exacerbation  with associated acute hypoxic respiratory failure -currently on bipap in ed , will wean as able  -lasix  60 mg iv bid  -cycle ce, check tsh, echo in am  -ekg without hyperacute st -twave changes  -cardiology consult in am     COPD -no acute exacerbation  -resume controller medications  -  prn nebs    Depression -resume home regimen    HTN -uncontrolled resume carvedilol , spironolactone , cozaar   HLD -continue statin   OSA  -wean bipap to at bedtime  cpap as able   Chronic pain   DJD of the spine -supportive  care   History of cocaine abuse -check uds   DVT prophylaxis: heparin  Code Status: full/ as discussed per patient wishes in event of cardiac arrest  Family Communication: none at bedside Disposition Plan: full/ as discussed per patient wishes in event of cardiac arrest  Consults called: cardiology  Admission status: SDU   Camila DELENA Ned MD [facility]   If 7PM-7AM, please contact night-coverage [email or web address] Password [facility]  02/20/2024, 8:28 AM

## 2024-02-20 NOTE — ED Triage Notes (Signed)
 Pt arrives via [facility] emergency traffic from home for respiratory distress. Reported to have found pt with O2 sats at 78% on RA. Pt denies using O2 chronically. Reports she is supposed to wear a CPAP at night. Received 4 DuoNeb nebulizer treatments en route. RT at bedside with BiPAP. Pt tolerating well at this time. EMS reports expiratory wheezing in upper lobes and diminished lung sounds in lower. 20 G IV present in RAC upon arrival. Received 125 mg SoluMedrol and 2 Magnesium   en route. PMH: COPD: HTN; DM

## 2024-02-20 NOTE — ED Notes (Signed)
 Lab called to collect blood work due to patient being difficult stick

## 2024-02-21 DIAGNOSIS — I10 Essential (primary) hypertension: Secondary | ICD-10-CM

## 2024-02-21 DIAGNOSIS — E785 Hyperlipidemia, unspecified: Secondary | ICD-10-CM

## 2024-02-21 DIAGNOSIS — E43 Unspecified severe protein-calorie malnutrition: Secondary | ICD-10-CM

## 2024-02-21 DIAGNOSIS — I429 Cardiomyopathy, unspecified: Secondary | ICD-10-CM

## 2024-02-21 DIAGNOSIS — I5023 Acute on chronic systolic (congestive) heart failure: Secondary | ICD-10-CM

## 2024-02-21 DIAGNOSIS — Z720 Tobacco use: Secondary | ICD-10-CM

## 2024-02-21 DIAGNOSIS — J432 Centrilobular emphysema: Secondary | ICD-10-CM | POA: Diagnosis not present

## 2024-02-21 DIAGNOSIS — J9601 Acute respiratory failure with hypoxia: Secondary | ICD-10-CM | POA: Diagnosis not present

## 2024-02-21 DIAGNOSIS — F1410 Cocaine abuse, uncomplicated: Secondary | ICD-10-CM

## 2024-02-21 LAB — CBC
HCT: 36.9 % (ref 36.0–46.0)
Hemoglobin: 12 g/dL (ref 12.0–15.0)
MCH: 27.6 pg (ref 26.0–34.0)
MCHC: 32.5 g/dL (ref 30.0–36.0)
MCV: 85 fL (ref 80.0–100.0)
Platelets: 364 10*3/uL (ref 150–400)
RBC: 4.34 MIL/uL (ref 3.87–5.11)
RDW: 13.2 % (ref 11.5–15.5)
WBC: 12.1 10*3/uL — ABNORMAL HIGH (ref 4.0–10.5)
nRBC: 0 % (ref 0.0–0.2)

## 2024-02-21 LAB — ECHOCARDIOGRAM COMPLETE
AR max vel: 1.98 cm2
AV Peak grad: 7.1 mmHg
Ao pk vel: 1.33 m/s
Area-P 1/2: 7.99 cm2
Calc EF: 15.6 %
Est EF: <20
Height: 60 in
MV VTI: 0.18 cm2
Radius: 0.5 cm
S' Lateral: 5 cm
Single Plane A2C EF: 10.2 %
Single Plane A4C EF: 25.3 %
Weight: 1360 [oz_av]

## 2024-02-21 LAB — COMPREHENSIVE METABOLIC PANEL WITH GFR
ALT: 49 U/L — ABNORMAL HIGH (ref 0–44)
AST: 31 U/L (ref 15–41)
Albumin: 3.1 g/dL — ABNORMAL LOW (ref 3.5–5.0)
Alkaline Phosphatase: 70 U/L (ref 38–126)
Anion gap: 9 (ref 5–15)
BUN: 32 mg/dL — ABNORMAL HIGH (ref 8–23)
CO2: 26 mmol/L (ref 22–32)
Calcium: 9 mg/dL (ref 8.9–10.3)
Chloride: 103 mmol/L (ref 98–111)
Creatinine, Ser: 0.98 mg/dL (ref 0.44–1.00)
GFR, Estimated: >60 mL/min (ref >60)
Glucose, Bld: 113 mg/dL — ABNORMAL HIGH (ref 70–99)
Potassium: 3.6 mmol/L (ref 3.5–5.1)
Sodium: 138 mmol/L (ref 135–145)
Total Bilirubin: 1.2 mg/dL (ref 0.0–1.2)
Total Protein: 6.4 g/dL — ABNORMAL LOW (ref 6.5–8.1)

## 2024-02-21 MED ORDER — FUROSEMIDE 10 MG/ML IJ SOLN
40.0000 mg | Freq: Every day | INTRAMUSCULAR | Status: DC
  Administered 2024-02-22: 40 mg via INTRAVENOUS
  Filled 2024-02-21: qty 4

## 2024-02-21 MED ORDER — GABAPENTIN 300 MG PO CAPS: 300.0000 mg | ORAL_CAPSULE | Freq: Every day | ORAL | Status: DC

## 2024-02-21 NOTE — Assessment & Plan Note (Signed)
 Continue home Cymbalta

## 2024-02-21 NOTE — Assessment & Plan Note (Signed)
 Estimated body mass index is 15.16 kg/m as calculated from the following:   Height as of this encounter: 5' (1.524 m).   Weight as of this encounter: 35.2 kg.   - Dietitian consult

## 2024-02-21 NOTE — Assessment & Plan Note (Signed)
 Likely due to acute on chronic HFrEF.  Initially required BiPAP, now weaned back to room air. No baseline oxygen use. - Continue to monitor -Supplemental oxygen as needed

## 2024-02-21 NOTE — Assessment & Plan Note (Signed)
 Respiratory status stable

## 2024-02-21 NOTE — Plan of Care (Signed)

## 2024-02-21 NOTE — Hospital Course (Addendum)
 Taken from H&P.  Penny Gill is a 63 y.o. female with medical history significant of  COPD, Depression, HTN, Sickle cell train, HLD, OSA on cpap at bedtime, Chronic pain,HFrEF 25- 30% ,history of cocaine abuse, DJD of the cervical spine who present to ED with acute respiratory distress. In the field patient was found to  have sat of 78% on RA.  Per patient she was out of her carvedilol  for few days but was compliant with her Lasix .  On presentation patient was in respiratory distress with transition to BiPAP.  Respiratory viral panel negative.  BNP significantly elevated and chest x-ray concerning for pulmonary edema. UDS positive for cocaine  Patient was admitted for acute on chronic HFrEF and started on IV diuresis.  6/29: Vitals and labs seems stable, Wean back to room air. mild azotemia with BUN of 32 today.  Only 400 UOP recorded??  Repeat echocardiogram done with pending results. Now clinically looks dry with some azotemia-decreasing Lasix  to 40 mg daily.  Blood pressure was also a little soft after getting Lasix  so holding losartan  and spironolactone  today.  6/30: Patient remained medically stable and now at baseline. Cardiology switched IV Lasix  with 20 mg of daily Lasix .  Patient was taking as needed before.  She was instructed to take daily Lasix  and continue with rest of her home medications. A new prescription for carvedilol  was provided.  And was counseled again for cocaine abuse.  Cocaine will make her high risk for mortality and exacerbations.  Patient is high risk for readmission and mortality based on life limiting comorbidities and continuation of substance abuse.  Patient will continue on current medications and need to have a close follow-up with her providers for further assistance.

## 2024-02-21 NOTE — Assessment & Plan Note (Signed)
Counseling was provided. -Nicotine patch as needed 

## 2024-02-21 NOTE — Assessment & Plan Note (Signed)
 Blood pressure currently borderline soft. - Holding home losartan  and spironolactone  today -Continue to monitor

## 2024-02-21 NOTE — Assessment & Plan Note (Signed)
 Continue statin.

## 2024-02-21 NOTE — Assessment & Plan Note (Signed)
 UDS again positive for cocaine, patient admitted to continue using it.  Counseling was provided. - Continue with counseling

## 2024-02-21 NOTE — Plan of Care (Signed)

## 2024-02-21 NOTE — Progress Notes (Signed)
 Progress Note   Patient: Penny Gill FMW:[id number] DOB: 05-23-1961 DOA: 02/20/2024     1 DOS: the patient was seen and examined on 02/21/2024   Brief hospital course: Taken from H&P.  RIFKY LAPRE is a 63 y.o. female with medical history significant of  COPD, Depression, HTN, Sickle cell train, HLD, OSA on cpap at bedtime, Chronic pain,HFrEF 25- 30% ,history of cocaine abuse, DJD of the cervical spine who present to ED with acute respiratory distress. In the field patient was found to  have sat of 78% on RA.  Per patient she was out of her carvedilol  for few days but was compliant with her Lasix .  On presentation patient was in respiratory distress with transition to BiPAP.  Respiratory viral panel negative.  BNP significantly elevated and chest x-ray concerning for pulmonary edema. UDS positive for cocaine  Patient was admitted for acute on chronic HFrEF and started on IV diuresis.  6/29: Vitals and labs seems stable, Wean back to room air. mild azotemia with BUN of 32 today.  Only 400 UOP recorded??  Repeat echocardiogram done with pending results. Now clinically looks dry with some azotemia-decreasing Lasix  to 40 mg daily.  Blood pressure was also a little soft after getting Lasix  so holding losartan  and spironolactone  today.  Assessment and Plan: * Acute on chronic heart failure with reduced ejection fraction (HFrEF, <= 40%) (HCC) Patient with history of EF of 20 to 30%, elevated BNP and chest x-ray with concern of pulmonary edema. Cardiac cath in April 2025 with nonischemic cardiomyopathy.  Developing some azotemia and blood pressure becoming softer.  Now on room air and clinically looks euvolemic. Question of compliance with ongoing cocaine use. Cardiology was consulted - Echocardiogram was repeated-pending results -Decreasing Lasix  to 40 mg daily -Daily weight and BMP -Strict intake and output  Acute hypoxic respiratory failure (HCC) Likely due to acute on chronic HFrEF.   Initially required BiPAP, now weaned back to room air. No baseline oxygen use. - Continue to monitor -Supplemental oxygen as needed  Essential hypertension Blood pressure currently borderline soft. - Holding home losartan  and spironolactone  today -Continue to monitor  COPD (chronic obstructive pulmonary disease) (HCC) No concern of exacerbation. - As needed bronchodilator  Hyperlipidemia - Continue statin  Depression - Continue home Cymbalta   Cocaine abuse (HCC) UDS again positive for cocaine, patient admitted to continue using it.  Counseling was provided. - Continue with counseling  Tobacco abuse Counseling was provided. - Nicotine  patch as needed  Protein-calorie malnutrition, severe (HCC) Estimated body mass index is 15.16 kg/m as calculated from the following:   Height as of this encounter: 5' (1.524 m).   Weight as of this encounter: 35.2 kg.   - Dietitian consult   Subjective: Patient was feeling at baseline when seen today.  Counseling was provided for cocaine abuse, patient became tearful stating that she wants to move out of that neighbor wall as there is a lot of temptations there.  She does miss home medications intermittently.  Physical Exam: Vitals:   02/21/24 0815 02/21/24 0851 02/21/24 1004 02/21/24 1006  BP: 102/71 102/71 (!) 87/72 94/73  Pulse: 100 98 (!) 50 98  Resp: 20     Temp: 97.8 F (36.6 C)     TempSrc: Oral     SpO2: 98%     Weight:      Height:       General.  Cachectic lady, in no acute distress. Pulmonary.  Lungs clear bilaterally, normal respiratory effort.  CV.  Regular rate and rhythm, no JVD, rub or murmur. Abdomen.  Soft, nontender, nondistended, BS positive. CNS.  Alert and oriented .  No focal neurologic deficit. Extremities.  No edema,  pulses intact and symmetrical. Psychiatry.  Judgment and insight appears normal.   Data Reviewed: Prior data reviewed  Family Communication: Talked with son on  phone.  Disposition: Status is: Inpatient Remains inpatient appropriate because: Severity of illness  Planned Discharge Destination: Home  DVT prophylaxis.  Subcu heparin  Time spent: 50 minutes  This record has been created using Conservation officer, historic buildings. Errors have been sought and corrected,but may not always be located. Such creation errors do not reflect on the standard of care.   Author: Amaryllis Dare, MD 02/21/2024 11:39 AM  For on call review [email or web address].

## 2024-02-21 NOTE — Assessment & Plan Note (Addendum)
 Patient with history of EF of 20 to 30%, elevated BNP and chest x-ray with concern of pulmonary edema. Cardiac cath in April 2025 with nonischemic cardiomyopathy.  Developing some azotemia and blood pressure becoming softer.  Now on room air and clinically looks euvolemic. Question of compliance with ongoing cocaine use. Cardiology was consulted - Echocardiogram was repeated-pending results -Decreasing Lasix  to 40 mg daily -Daily weight and BMP -Strict intake and output

## 2024-02-21 NOTE — Consult Note (Signed)
 Cardiology Consultation   Patient ID: [name] MRN: [id number]; DOB: 01/18/1961  Admit date: 02/20/2024 Date of Consult: 02/21/2024  PCP:  [facility]   [facility] Providers Cardiologist:  Deatrice Cage, MD        Patient Profile: Penny Gill is a 63 y.o. female with a hx of cardiomyopathy, cocaine use who is being seen 02/21/2024 for the evaluation of shortness of breath at the request of Dr. Caleen.  History of Present Illness: Penny Gill is a 63 year old female with history of HFrEF EF 25 to 30%, cocaine use, presenting with worsening shortness of breath and hypoxia.  States having worsening shortness of breath yesterday prompting EMS to be called.  On presentation, she was started 73% on room air.  Endorsed using cocaine.  States being compliant with medication as prescribed, takes Lasix  as needed at home.  In the ED, EKG showed sinus tachycardia, chest x-ray consistent with pulmonary edema.  Started on IV Lasix  60 mg twice daily.  UDS was positive for cocaine.  States feeling much better upon my exam this morning.   Past Medical History:  Diagnosis Date   COPD (chronic obstructive pulmonary disease) (HCC)    emphysema   Depression    Hyperlipidemia    Hypertension    Kidney stone    Osteoporosis    Sickle cell trait (HCC)     Past Surgical History:  Procedure Laterality Date   RIGHT/LEFT HEART CATH AND CORONARY ANGIOGRAPHY N/A 12/22/2023   Procedure: RIGHT/LEFT HEART CATH AND CORONARY ANGIOGRAPHY;  Surgeon: Mady Bruckner, MD;  Location: [facility] INVASIVE CV LAB;  Service: Cardiovascular;  Laterality: N/A;   TUBAL LIGATION       Home Medications:  Prior to Admission medications   Medication Sig Start Date End Date Taking? Authorizing Provider  acetaminophen  (TYLENOL ) 325 MG tablet Take 2 tablets (650 mg total) by mouth every 6 (six) hours as needed for mild pain (pain score 1-3), moderate pain (pain score 4-6), fever or headache  (or Fever >/= 101). 12/23/23  Yes Alexander, Natalie, DO  aspirin  EC 81 MG tablet Take 1 tablet (81 mg total) by mouth daily. Swallow whole. 11/18/23  Yes Fausto Burnard LABOR, DO  atorvastatin  (LIPITOR) 40 MG tablet Take 1 tablet (40 mg total) by mouth at bedtime. 01/19/24  Yes Hackney, Tina A, FNP  DULoxetine  (CYMBALTA ) 30 MG capsule Take 1 capsule by mouth daily. 11/02/23 11/01/24 Yes [provider]  folic acid  (FOLVITE ) 1 MG tablet Take 1 tablet (1 mg total) by mouth daily. 12/24/23  Yes Alexander, Natalie, DO  furosemide  (LASIX ) 20 MG tablet Take 1 tablet (20 mg total) by mouth daily as needed for fluid. Weight > 3 lb in a 24 hour period. Patient taking differently: Take 20 mg by mouth daily as needed for fluid. Weight > 3 lb in a 24 hour period. Pt stated she takes it everyday 01/19/24  Yes Hackney, Ellouise A, FNP  gabapentin  (NEURONTIN ) 300 MG capsule Take 300 mg by mouth 4 (four) times daily. Pt stated that she only takes 1 a day. Make her very drowsy   Yes [provider]  losartan  (COZAAR ) 25 MG tablet Take 0.5 tablets (12.5 mg total) by mouth daily. 01/19/24  Yes Donette Ellouise LABOR, FNP  Multiple Vitamin (MULTIVITAMIN WITH MINERALS) TABS tablet Take 1 tablet by mouth daily. 12/24/23  Yes Alexander, Natalie, DO  nicotine  (NICODERM CQ  - DOSED IN MG/24 HR) 7 mg/24hr patch Place 1 patch (7 mg  total) onto the skin daily. 12/24/23  Yes Alexander, Natalie, DO  spironolactone  (ALDACTONE ) 25 MG tablet Take 0.5 tablets (12.5 mg total) by mouth daily. 01/19/24  Yes [place] A, FNP  tiZANidine  (ZANAFLEX ) 2 MG tablet Take 1 tablet (2 mg total) by mouth every 8 (eight) hours as needed for muscle spasms. 01/19/24  Yes [place] A, FNP  carvedilol  (COREG ) 3.125 MG tablet Take 1 tablet (3.125 mg total) by mouth 2 (two) times daily with a meal. Patient not taking: Reported on 02/20/2024 01/19/24   Donette [place] LABOR, FNP  nystatin -triamcinolone  (MYCOLOG II) cream Apply topically 2 (two) times daily. Patient  not taking: Reported on 01/19/2024 01/12/24   Trudy Anthony HERO, MD  thiamine  (VITAMIN B-1) 100 MG tablet Take 1 tablet (100 mg total) by mouth daily. Patient not taking: Reported on 02/20/2024 12/24/23   Marsa Edelman, DO    Scheduled Meds:  aspirin  EC  81 mg Oral Daily   atorvastatin   40 mg Oral QHS   carvedilol   3.125 mg Oral BID WC   DULoxetine   30 mg Oral Daily   folic acid   1 mg Oral Daily   [START ON 02/22/2024] furosemide   40 mg Intravenous Daily   [START ON 02/22/2024] gabapentin   300 mg Oral QHS   heparin   5,000 Units Subcutaneous Q8H   losartan   12.5 mg Oral Daily   multivitamin with minerals  1 tablet Oral Daily   nicotine   7 mg Transdermal Daily   pneumococcal 20-valent conjugate vaccine  0.5 mL Intramuscular Tomorrow-1000   spironolactone   12.5 mg Oral Daily   thiamine   100 mg Oral Daily   Continuous Infusions:  PRN Meds: acetaminophen  **OR** acetaminophen , albuterol , ondansetron  **OR** ondansetron  (ZOFRAN ) IV  Allergies:   No Known Allergies  Social History:   Social History   Socioeconomic History   Marital status: Married    Spouse name: Not on file   Number of children: 2   Years of education: Not on file   Highest education level: Some college, no degree  Occupational History   Not on file  Tobacco Use   Smoking status: Every Day    Current packs/day: 0.00    Types: Cigarettes, E-cigarettes    Last attempt to quit: 01/2024    Years since quitting: 0.0   Smokeless tobacco: Never   Tobacco comments:    I vape every now and then  Vaping Use   Vaping status: Former   Substances: Nicotine   Substance and Sexual Activity   Alcohol use: Not Currently    Comment: rare   Drug use: Not Currently    Types: Marijuana, Cocaine    Comment: History of cocaine abuse   Sexual activity: Not on file  Other Topics Concern   Not on file  Social History Narrative   Not on file   Social Drivers of Health   Financial Resource Strain: Medium Risk (12/22/2023)    Overall Financial Resource Strain (CARDIA)    Difficulty of Paying Living Expenses: Somewhat hard  Food Insecurity: No Food Insecurity (02/20/2024)   Hunger Vital Sign    Worried About Running Out of Food in the Last Year: Never true    Ran Out of Food in the Last Year: Never true  Transportation Needs: No Transportation Needs (02/20/2024)   PRAPARE - Transportation    Lack of Transportation (Medical): No    Lack of Transportation (Non-Medical): No  Physical Activity: Not on File (09/10/2021)   Received from [facility] [address]   Physical  Activity    Physical Activity: 0  Stress: Not on File (09/10/2021)   Received from [facility]   Stress    Stress: 0  Social Connections: Moderately Integrated (02/20/2024)   Social Connection and Isolation Panel    Frequency of Communication with Friends and Family: Three times a week    Frequency of Social Gatherings with Friends and Family: Three times a week    Attends Religious Services: 1 to 4 times per year    Active Member of Clubs or Organizations: No    Attends Banker Meetings: 1 to 4 times per year    Marital Status: Separated  Intimate Partner Violence: Not At Risk (02/20/2024)   Humiliation, Afraid, Rape, and Kick questionnaire    Fear of Current or Ex-Partner: No    Emotionally Abused: No    Physically Abused: No    Sexually Abused: No    Family History:    Family History  Problem Relation Age of Onset   Breast cancer Maternal Aunt 40     ROS:  Please see the history of present illness.   All other ROS reviewed and negative.     Physical Exam/Data: Vitals:   02/21/24 0851 02/21/24 1004 02/21/24 1006 02/21/24 1205  BP: 102/71 (!) 87/72 94/73 (!) 89/69  Pulse: 98 (!) 50 98 95  Resp:    15  Temp:    97.7 F (36.5 C)  TempSrc:    Oral  SpO2:      Weight:      Height:        Intake/Output Summary (Last 24 hours) at 02/21/2024 1323 Last data filed at 02/21/2024 1004 Gross per 24 hour  Intake 953 ml  Output 400 ml  Net 553  ml      02/21/2024    4:57 AM 02/20/2024    6:43 AM 02/12/2024    8:43 AM  Last 3 Weights  Weight (lbs) 77 lb 9.6 oz 85 lb 77 lb  Weight (kg) 35.2 kg 38.556 kg 34.927 kg     Body mass index is 15.16 kg/m.  General:  Well nourished, well developed, in no acute distress HEENT: normal Neck: no JVD Vascular: No carotid bruits; Distal pulses 2+ bilaterally Cardiac:  normal S1, S2; RRR; no murmur  Lungs:  clear to auscultation bilaterally, no wheezing, rhonchi or rales  Abd: soft, nontender, no hepatomegaly  Ext: no edema Musculoskeletal:  No deformities, BUE and BLE strength normal and equal Skin: warm and dry  Neuro:  CNs 2-12 intact, no focal abnormalities noted Psych:  Normal affect   EKG:  The EKG was personally reviewed and demonstrates: Sinus tachycardia Telemetry:  Telemetry was personally reviewed and demonstrates: Sinus rhythm  Relevant CV Studies: Echo 02/20/2024 EF<20%  Laboratory Data: High Sensitivity Troponin:   Recent Labs  Lab 02/20/24 0650 02/20/24 0918 02/20/24 1450 02/20/24 2004  TROPONINIHS 24* 35* 46* 41*     Chemistry Recent Labs  Lab 02/19/24 1331 02/20/24 0650 02/20/24 0918 02/21/24 0506  NA 141 139  --  138  K 4.3 4.0  --  3.6  CL 109 108  --  103  CO2 23 22  --  26  GLUCOSE 120* 227*  --  113*  BUN 21 18  --  32*  CREATININE 0.83 0.88 0.83 0.98  CALCIUM  8.9 8.5*  --  9.0  MG  --  2.3  --   --   GFRNONAA >60 >60 >60 >60  ANIONGAP 9 9  --  9    Recent Labs  Lab 02/20/24 0650 02/21/24 0506  PROT 7.0 6.4*  ALBUMIN 3.4* 3.1*  AST 120* 31  ALT 86* 49*  ALKPHOS 94 70  BILITOT 0.9 1.2   Lipids No results for input(s): CHOL, TRIG, HDL, LABVLDL, LDLCALC, CHOLHDL in the last 168 hours.  Hematology Recent Labs  Lab 02/20/24 0650 02/20/24 0918 02/21/24 0506  WBC 9.4 9.2 12.1*  RBC 4.75 4.79 4.34  HGB 13.6 13.4 12.0  HCT 43.5 41.5 36.9  MCV 91.6 86.6 85.0  MCH 28.6 28.0 27.6  MCHC 31.3 32.3 32.5  RDW 13.3 13.2 13.2   PLT 356 339 364   Thyroid  No results for input(s): TSH, FREET4 in the last 168 hours.  BNP Recent Labs  Lab 02/20/24 0658  BNP 1,843.4*    DDimer No results for input(s): DDIMER in the last 168 hours.  Radiology/Studies:  ECHOCARDIOGRAM COMPLETE Result Date: 02/21/2024    ECHOCARDIOGRAM REPORT   Patient Name:   PERNELL DIKES Date of Exam: 02/20/2024 Medical Rec #:  [id number]       Height:       60.0 in Accession #:    [id number]      Weight:       85.0 lb Date of Birth:  1961-08-08       BSA:          1.298 m Patient Age:    63 years        BP:           109/76 mmHg Patient Gender: F               HR:           103 bpm. Exam Location:  [facility] Procedure: 2D Echo, Cardiac Doppler and Color Doppler (Both Spectral and Color            Flow Doppler were utilized during procedure). Indications:     CHF I50.31  History:         Patient has prior history of Echocardiogram examinations, most                  recent 01/09/2024.  Sonographer:     Thedora Louder RDCS, FASE Referring Phys:  [id number] CAMILA A THOMAS Diagnosing Phys: Redell Cave MD IMPRESSIONS  1. Left ventricular ejection fraction, by estimation, is <20%. Left ventricular ejection fraction by 2D MOD biplane is 15.6 %. The left ventricle has severely decreased function. The left ventricle demonstrates global hypokinesis. Left ventricular diastolic parameters are consistent with Grade I diastolic dysfunction (impaired relaxation).  2. Right ventricular systolic function is low normal. The right ventricular size is normal.  3. Left atrial size was mildly dilated.  4. The mitral valve is normal in structure. Moderate mitral valve regurgitation.  5. The aortic valve is tricuspid. Aortic valve regurgitation is not visualized.  6. The inferior vena cava is normal in size with greater than 50% respiratory variability, suggesting right atrial pressure of 3 mmHg. FINDINGS  Left Ventricle: Left ventricular ejection fraction, by estimation, is  <20%. Left ventricular ejection fraction by 2D MOD biplane is 15.6 %. The left ventricle has severely decreased function. The left ventricle demonstrates global hypokinesis. The left ventricular internal cavity size was normal in size. There is no left ventricular hypertrophy. Left ventricular diastolic parameters are consistent with Grade I diastolic dysfunction (impaired relaxation). Right Ventricle: The right ventricular size is normal. No increase in right ventricular wall thickness. Right ventricular systolic  function is low normal. Left Atrium: Left atrial size was mildly dilated. Right Atrium: Right atrial size was normal in size. Pericardium: There is no evidence of pericardial effusion. Mitral Valve: The mitral valve is normal in structure. Moderate mitral valve regurgitation. MV peak gradient, 118.8 mmHg. The mean mitral valve gradient is 69.0 mmHg. Tricuspid Valve: The tricuspid valve is normal in structure. Tricuspid valve regurgitation is mild. Aortic Valve: The aortic valve is tricuspid. Aortic valve regurgitation is not visualized. Aortic valve peak gradient measures 7.1 mmHg. Pulmonic Valve: The pulmonic valve was not well visualized. Pulmonic valve regurgitation is not visualized. Aorta: The aortic root is normal in size and structure. Venous: The inferior vena cava is normal in size with greater than 50% respiratory variability, suggesting right atrial pressure of 3 mmHg. IAS/Shunts: No atrial level shunt detected by color flow Doppler.  LEFT VENTRICLE PLAX 2D                        Biplane EF (MOD) LVIDd:         5.60 cm         LV Biplane EF:   Left LVIDs:         5.00 cm                          ventricular LV PW:         1.20 cm                          ejection LV IVS:        1.30 cm                          fraction by LVOT diam:     2.00 cm                          2D MOD LV SV:         30                               biplane is LV SV Index:   23                               15.6 %. LVOT  Area:     3.14 cm                                Diastology                                LV e' medial:    6.64 cm/s LV Volumes (MOD)               LV E/e' medial:  7.3 LV vol d, MOD    118.0 ml      LV e' lateral:   8.49 cm/s A2C:                           LV E/e' lateral: 5.7 LV vol d, MOD    146.0 ml A4C: LV vol s, MOD  106.0 ml A2C: LV vol s, MOD    109.0 ml A4C: LV SV MOD A2C:   12.0 ml LV SV MOD A4C:   146.0 ml LV SV MOD BP:    20.4 ml RIGHT VENTRICLE RV Basal diam:  2.60 cm RV S prime:     9.36 cm/s TAPSE (M-mode): 1.2 cm LEFT ATRIUM             Index        RIGHT ATRIUM          Index LA diam:        3.00 cm 2.31 cm/m   RA Area:     8.08 cm LA Vol (A2C):   35.9 ml 27.67 ml/m  RA Volume:   13.90 ml 10.71 ml/m LA Vol (A4C):   56.4 ml 43.47 ml/m LA Biplane Vol: 49.1 ml 37.84 ml/m  AORTIC VALVE                 PULMONIC VALVE AV Area (Vmax): 1.98 cm     PV Vmax:        0.80 m/s AV Vmax:        133.00 cm/s  PV Peak grad:   2.6 mmHg AV Peak Grad:   7.1 mmHg     RVOT Peak grad: 2 mmHg LVOT Vmax:      83.80 cm/s LVOT Vmean:     52.200 cm/s LVOT VTI:       0.096 m  AORTA Ao Root diam: 3.20 cm Ao Asc diam:  2.80 cm MITRAL VALVE               TRICUSPID VALVE MV Area (PHT): 7.99 cm    TR Peak grad:   26.4 mmHg MV Area VTI:   0.18 cm    TR Vmax:        257.00 cm/s MV Peak grad:  118.8 mmHg MV Mean grad:  69.0 mmHg   SHUNTS MV Vmax:       5.45 m/s    Systemic VTI:  0.10 m MV Vmean:      383.0 cm/s  Systemic Diam: 2.00 cm MV Decel Time: 95 msec MR PISA:        1.57 cm MR PISA Radius: 0.50 cm MV E velocity: 48.70 cm/s MV A velocity: 84.40 cm/s MV E/A ratio:  0.58 Redell Cave MD Electronically signed by Redell Cave MD Signature Date/Time: 02/21/2024/12:55:12 PM    Final    DG Chest Portable 1 View Result Date: 02/20/2024 CLINICAL DATA:  Dyspnea and hypoxia. EXAM: PORTABLE CHEST 1 VIEW COMPARISON:  Portable chest 01/07/2024 FINDINGS: Interstitial and patchy airspace disease throughout the lung  fields is noted with relative apical sparing and with a basal gradient, denser on the right. Given additional findings cardiomegaly and central vascular prominence, this is probably in total or at least mostly due to interstitial and alveolar pulmonary edema from CHF or fluid overload. There was a similar appearance on a portable chest from 12/20/2023 further suggesting an edematous etiology. Underlying pneumonia is possible in the proper clinical setting. Small pleural effusions are beginning to form. Central bronchi are increasingly thickened. The mediastinum is stable. No new osseous findings. Overlying monitor wires. IMPRESSION: 1. Interstitial and patchy airspace disease throughout the lung fields with a basal gradient, denser on the right. Given additional findings cardiomegaly and central vascular prominence, this is probably in total or at least mostly due to interstitial and alveolar pulmonary edema from CHF or fluid overload. Underlying pneumonia  is possible in the proper clinical setting. 2. Small pleural effusions are beginning to form. 3. Increasing central bronchial thickening. Electronically Signed   By: Francis Quam M.D.   On: 02/20/2024 07:21     Assessment and Plan: Cardiomyopathy, likely secondary to cocaine use. - EF less than 20%. - Shortness of breath resolved with IV diuresis. - Reduce diuretics to IV Lasix  40 mg daily.  Likely transition to p.o. tomorrow. - Continue Coreg  3.125 mg twice daily, losartan  12.5 mg daily, Aldactone  12.5 mg daily. - Low BP preventing up titration of GDMT. - Not a candidate for advanced therapies with ongoing cocaine use.  2.  Cocaine use - UDS positive this admit - Cessation advised.   Signed, Redell Cave, MD  02/21/2024 1:23 PM

## 2024-02-22 ENCOUNTER — Telehealth (HOSPITAL_COMMUNITY): Payer: Self-pay | Admitting: Pharmacy Technician

## 2024-02-22 ENCOUNTER — Other Ambulatory Visit: Payer: Self-pay

## 2024-02-22 ENCOUNTER — Other Ambulatory Visit (HOSPITAL_COMMUNITY): Payer: Self-pay

## 2024-02-22 DIAGNOSIS — I5023 Acute on chronic systolic (congestive) heart failure: Secondary | ICD-10-CM | POA: Diagnosis not present

## 2024-02-22 DIAGNOSIS — F1490 Cocaine use, unspecified, uncomplicated: Secondary | ICD-10-CM | POA: Diagnosis not present

## 2024-02-22 DIAGNOSIS — R7989 Other specified abnormal findings of blood chemistry: Secondary | ICD-10-CM

## 2024-02-22 DIAGNOSIS — J9601 Acute respiratory failure with hypoxia: Secondary | ICD-10-CM | POA: Diagnosis not present

## 2024-02-22 LAB — BASIC METABOLIC PANEL WITH GFR
Anion gap: 10 (ref 5–15)
BUN: 36 mg/dL — ABNORMAL HIGH (ref 8–23)
CO2: 27 mmol/L (ref 22–32)
Calcium: 9.2 mg/dL (ref 8.9–10.3)
Chloride: 101 mmol/L (ref 98–111)
Creatinine, Ser: 0.85 mg/dL (ref 0.44–1.00)
GFR, Estimated: >60 mL/min (ref >60)
Glucose, Bld: 98 mg/dL (ref 70–99)
Potassium: 4.3 mmol/L (ref 3.5–5.1)
Sodium: 138 mmol/L (ref 135–145)

## 2024-02-22 MED ORDER — ENSURE PLUS HIGH PROTEIN PO LIQD: 237.0000 mL | Freq: Three times a day (TID) | ORAL | 2 refills | Status: DC

## 2024-02-22 MED ORDER — FUROSEMIDE 20 MG PO TABS: 20.0000 mg | ORAL_TABLET | Freq: Every day | ORAL | 1 refills | Status: DC | PRN

## 2024-02-22 MED ORDER — FUROSEMIDE 20 MG PO TABS
20.0000 mg | ORAL_TABLET | Freq: Every day | ORAL | Status: DC | PRN
Start: 2024-02-23 — End: 2024-02-23

## 2024-02-22 MED ORDER — CARVEDILOL 3.125 MG PO TABS: 3.1250 mg | ORAL_TABLET | Freq: Two times a day (BID) | ORAL | 5 refills | Status: DC

## 2024-02-22 MED ORDER — ENSURE PLUS HIGH PROTEIN PO LIQD
237.0000 mL | Freq: Three times a day (TID) | ORAL | 2 refills | Status: AC
  Filled 2024-02-22: qty 30000, 42d supply, fill #0

## 2024-02-22 MED ORDER — CARVEDILOL 3.125 MG PO TABS
3.1250 mg | ORAL_TABLET | Freq: Two times a day (BID) | ORAL | 5 refills | Status: DC
  Filled 2024-02-22: qty 60, 30d supply, fill #0

## 2024-02-22 MED ORDER — FUROSEMIDE 20 MG PO TABS
20.0000 mg | ORAL_TABLET | Freq: Every day | ORAL | 1 refills | Status: DC | PRN
  Filled 2024-02-22: qty 30, 30d supply, fill #0
  Filled 2024-04-21: qty 30, 30d supply, fill #1

## 2024-02-22 MED ORDER — ENSURE PLUS HIGH PROTEIN PO LIQD
237.0000 mL | Freq: Three times a day (TID) | ORAL | Status: DC
  Administered 2024-02-22 (×2): 237 mL via ORAL

## 2024-02-22 NOTE — Assessment & Plan Note (Signed)
 Likely due to acute on chronic HFrEF.  Initially required BiPAP, now weaned back to room air. No baseline oxygen use.

## 2024-02-22 NOTE — Progress Notes (Signed)
 1715: Discharge instructions given and reviewed with patient. Patient verbalized understanding of d/c instructions given. VSS. No distress. Patient to be discharged from 2A in wheelchair with hospital transporter and family member.

## 2024-02-22 NOTE — Plan of Care (Signed)
  Problem: Education: Goal: Knowledge of General Education information will improve Description: Including pain rating scale, medication(s)/side effects and non-pharmacologic comfort measures Outcome: Progressing   Problem: Clinical Measurements: Goal: Will remain free from infection Outcome: Progressing   Problem: Nutrition: Goal: Adequate nutrition will be maintained Outcome: Progressing   Problem: Safety: Goal: Ability to remain free from injury will improve Outcome: Progressing  Plan of care ongoing, see MAR see flowsheet

## 2024-02-22 NOTE — OR Nursing (Signed)
 1700: Tele called and informed, patient just had an 18 beat run of V. Tach. Dr. Caleen informed. Per Dr. Caleen, may still proceed with discharge to home today.

## 2024-02-22 NOTE — Discharge Summary (Signed)
 Physician Discharge Summary   Patient: Penny Gill MRN: [id number] DOB: 1960/09/17  Admit date:     02/20/2024  Discharge date: 02/22/24  Discharge Physician: Amaryllis Dare   PCP: [facility]   Recommendations at discharge:  Please obtain CBC and BMP on follow-up Follow-up in heart failure clinic Follow-up with primary care provider  Discharge Diagnoses: Principal Problem:   Acute on chronic heart failure with reduced ejection fraction (HFrEF, <= 40%) (HCC) Active Problems:   Acute respiratory failure with hypoxia (HCC)   Essential hypertension   COPD (chronic obstructive pulmonary disease) (HCC)   Hyperlipidemia   Depression   Cocaine abuse (HCC)   Tobacco abuse   Protein-calorie malnutrition, severe (HCC)   Cocaine use   Elevated LFTs   Hospital Course: Taken from H&P.  Penny Gill is a 63 y.o. female with medical history significant of  COPD, Depression, HTN, Sickle cell train, HLD, OSA on cpap at bedtime, Chronic pain,HFrEF 25- 30% ,history of cocaine abuse, DJD of the cervical spine who present to ED with acute respiratory distress. In the field patient was found to  have sat of 78% on RA.  Per patient she was out of her carvedilol  for few days but was compliant with her Lasix .  On presentation patient was in respiratory distress with transition to BiPAP.  Respiratory viral panel negative.  BNP significantly elevated and chest x-ray concerning for pulmonary edema. UDS positive for cocaine  Patient was admitted for acute on chronic HFrEF and started on IV diuresis.  6/29: Vitals and labs seems stable, Wean back to room air. mild azotemia with BUN of 32 today.  Only 400 UOP recorded??  Repeat echocardiogram done with pending results. Now clinically looks dry with some azotemia-decreasing Lasix  to 40 mg daily.  Blood pressure was also a little soft after getting Lasix  so holding losartan  and spironolactone  today.  6/30: Patient remained medically  stable and now at baseline. Cardiology switched IV Lasix  with 20 mg of daily Lasix .  Patient was taking as needed before.  She was instructed to take daily Lasix  and continue with rest of her home medications. A new prescription for carvedilol  was provided.  And was counseled again for cocaine abuse.  Cocaine will make her high risk for mortality and exacerbations.  Patient is high risk for readmission and mortality based on life limiting comorbidities and continuation of substance abuse.  Patient will continue on current medications and need to have a close follow-up with her providers for further assistance.    Assessment and Plan: * Acute on chronic heart failure with reduced ejection fraction (HFrEF, <= 40%) (HCC) Patient with history of EF of 20 to 30%, elevated BNP and chest x-ray with concern of pulmonary edema. Cardiac cath in April 2025 with nonischemic cardiomyopathy.  Developing some azotemia and blood pressure becoming softer.  Now on room air and clinically looks euvolemic. Question of compliance with ongoing cocaine use. Cardiology was consulted - Repeat echocardiogram with no acute abnormality, EF remain less than 20% and grade 1 diastolic dysfunction. -She received a dose of IV Lasix  today and will start 20 mg of daily Lasix  along with other heart failure medications.   Acute respiratory failure with hypoxia (HCC) Likely due to acute on chronic HFrEF.  Initially required BiPAP, now weaned back to room air. No baseline oxygen use.  Essential hypertension Blood pressure currently borderline soft. - Holding home losartan  and spironolactone  today -Continue to monitor  COPD (chronic obstructive pulmonary disease) (  HCC) No concern of exacerbation. - As needed bronchodilator  Hyperlipidemia - Continue statin  Depression - Continue home Cymbalta   Cocaine abuse (HCC) UDS again positive for cocaine, patient admitted to continue using it.  Counseling was provided. -  Continue with counseling  Tobacco abuse Counseling was provided. - Nicotine  patch as needed  Protein-calorie malnutrition, severe (HCC) Estimated body mass index is 15.16 kg/m as calculated from the following:   Height as of this encounter: 5' (1.524 m).   Weight as of this encounter: 35.2 kg.   - Dietitian consult       Consultants: Cardiology Procedures performed: None Disposition: Home Diet recommendation:  Discharge Diet Orders (From admission, onward)     Start     Ordered   02/22/24 0000  Diet - low sodium heart healthy        02/22/24 1546           Cardiac diet DISCHARGE MEDICATION: Allergies as of 02/22/2024   No Known Allergies      Medication List     STOP taking these medications    nystatin -triamcinolone  cream Commonly known as: MYCOLOG II       TAKE these medications    acetaminophen  325 MG tablet Commonly known as: TYLENOL  Take 2 tablets (650 mg total) by mouth every 6 (six) hours as needed for mild pain (pain score 1-3), moderate pain (pain score 4-6), fever or headache (or Fever >/= 101).   aspirin  EC 81 MG tablet Take 1 tablet (81 mg total) by mouth daily. Swallow whole.   atorvastatin  40 MG tablet Commonly known as: LIPITOR Take 1 tablet (40 mg total) by mouth at bedtime.   carvedilol  3.125 MG tablet Commonly known as: COREG  Take 1 tablet (3.125 mg total) by mouth 2 (two) times daily with a meal.   DULoxetine  30 MG capsule Commonly known as: CYMBALTA  Take 1 capsule by mouth daily.   feeding supplement Liqd Take 237 mLs by mouth 3 (three) times daily between meals.   folic acid  1 MG tablet Commonly known as: FOLVITE  Take 1 tablet (1 mg total) by mouth daily.   furosemide  20 MG tablet Commonly known as: LASIX  Take 1 tablet (20 mg total) by mouth daily as needed for fluid or edema. Start taking on: February 23, 2024 What changed:  reasons to take this additional instructions These instructions start on February 23, 2024. If  you are unsure what to do until then, ask your doctor or other care provider.   gabapentin  300 MG capsule Commonly known as: NEURONTIN  Take 300 mg by mouth 4 (four) times daily. Pt stated that she only takes 1 a day. Make her very drowsy   losartan  25 MG tablet Commonly known as: COZAAR  Take 0.5 tablets (12.5 mg total) by mouth daily.   multivitamin with minerals Tabs tablet Take 1 tablet by mouth daily.   nicotine  7 mg/24hr patch Commonly known as: NICODERM CQ  - dosed in mg/24 hr Place 1 patch (7 mg total) onto the skin daily.   spironolactone  25 MG tablet Commonly known as: ALDACTONE  Take 0.5 tablets (12.5 mg total) by mouth daily.   thiamine  100 MG tablet Commonly known as: VITAMIN B1 Take 1 tablet (100 mg total) by mouth daily.   tiZANidine  2 MG tablet Commonly known as: ZANAFLEX  Take 1 tablet (2 mg total) by mouth every 8 (eight) hours as needed for muscle spasms.        Follow-up Information     [facility]. Go on 02/29/2024.   Specialty: Cardiology Why: Hospital Folow-up 02/29/2024 @ 2:30 PM Please bring all medications to follow-up appointment [facility], [address] Free Valet Parking at the Advertising account planner information: [address] [place]  [phone]        [facility] Schedule an appointment as soon as possible for a visit in 1 week(s).   Contact information: [address] [place] [address] [phone]                Discharge Exam: Fredricka Weights   02/20/24 9356 02/21/24 0457 02/22/24 0500  Weight: 38.6 kg 35.2 kg 35.2 kg   General.  Cachectic lady, in no acute distress. Pulmonary.  Lungs clear bilaterally, normal respiratory effort. CV.  Regular rate and rhythm, no JVD, rub or murmur. Abdomen.  Soft, nontender, nondistended, BS positive. CNS.  Alert and oriented .  No focal neurologic  deficit. Extremities.  No edema, pulses intact and symmetrical. Psychiatry.  Judgment and insight appears normal.   Condition at discharge: stable  The results of significant diagnostics from this hospitalization (including imaging, microbiology, ancillary and laboratory) are listed below for reference.   Imaging Studies: ECHOCARDIOGRAM COMPLETE Result Date: 02/21/2024    ECHOCARDIOGRAM REPORT   Patient Name:   MONASIA LAIR Date of Exam: 02/20/2024 Medical Rec #:  [id number]       Height:       60.0 in Accession #:    [id number]      Weight:       85.0 lb Date of Birth:  [birth year]       BSA:          1.298 m Patient Age:    63 years        BP:           109/76 mmHg Patient Gender: F               HR:           103 bpm. Exam Location:  [facility] Procedure: 2D Echo, Cardiac Doppler and Color Doppler (Both Spectral and Color            Flow Doppler were utilized during procedure). Indications:     CHF I50.31  History:         Patient has prior history of Echocardiogram examinations, most                  recent 01/09/2024.  Sonographer:     Thedora Louder RDCS, FASE Referring Phys:  [id number] CAMILA A THOMAS Diagnosing Phys: Redell Cave MD IMPRESSIONS  1. Left ventricular ejection fraction, by estimation, is <20%. Left ventricular ejection fraction by 2D MOD biplane is 15.6 %. The left ventricle has severely decreased function. The left ventricle demonstrates global hypokinesis. Left ventricular diastolic parameters are consistent with Grade I diastolic dysfunction (impaired relaxation).  2. Right ventricular systolic function is low normal. The right ventricular size is normal.  3. Left atrial size was mildly dilated.  4. The mitral valve is normal in structure. Moderate mitral valve regurgitation.  5. The aortic valve is tricuspid. Aortic valve regurgitation is not visualized.  6. The inferior vena cava is normal in size with greater than 50% respiratory variability, suggesting right atrial pressure of  3 mmHg. FINDINGS  Left Ventricle: Left ventricular ejection fraction, by estimation, is <20%. Left ventricular ejection fraction by 2D MOD biplane is 15.6 %.  The left ventricle has severely decreased function. The left ventricle demonstrates global hypokinesis. The left ventricular internal cavity size was normal in size. There is no left ventricular hypertrophy. Left ventricular diastolic parameters are consistent with Grade I diastolic dysfunction (impaired relaxation). Right Ventricle: The right ventricular size is normal. No increase in right ventricular wall thickness. Right ventricular systolic function is low normal. Left Atrium: Left atrial size was mildly dilated. Right Atrium: Right atrial size was normal in size. Pericardium: There is no evidence of pericardial effusion. Mitral Valve: The mitral valve is normal in structure. Moderate mitral valve regurgitation. MV peak gradient, 118.8 mmHg. The mean mitral valve gradient is 69.0 mmHg. Tricuspid Valve: The tricuspid valve is normal in structure. Tricuspid valve regurgitation is mild. Aortic Valve: The aortic valve is tricuspid. Aortic valve regurgitation is not visualized. Aortic valve peak gradient measures 7.1 mmHg. Pulmonic Valve: The pulmonic valve was not well visualized. Pulmonic valve regurgitation is not visualized. Aorta: The aortic root is normal in size and structure. Venous: The inferior vena cava is normal in size with greater than 50% respiratory variability, suggesting right atrial pressure of 3 mmHg. IAS/Shunts: No atrial level shunt detected by color flow Doppler.  LEFT VENTRICLE PLAX 2D                        Biplane EF (MOD) LVIDd:         5.60 cm         LV Biplane EF:   Left LVIDs:         5.00 cm                          ventricular LV PW:         1.20 cm                          ejection LV IVS:        1.30 cm                          fraction by LVOT diam:     2.00 cm                          2D MOD LV SV:         30                                biplane is LV SV Index:   23                               15.6 %. LVOT Area:     3.14 cm                                Diastology                                LV e' medial:    6.64 cm/s LV Volumes (MOD)               LV E/e' medial:  7.3 LV vol d, MOD  118.0 ml      LV e' lateral:   8.49 cm/s A2C:                           LV E/e' lateral: 5.7 LV vol d, MOD    146.0 ml A4C: LV vol s, MOD    106.0 ml A2C: LV vol s, MOD    109.0 ml A4C: LV SV MOD A2C:   12.0 ml LV SV MOD A4C:   146.0 ml LV SV MOD BP:    20.4 ml RIGHT VENTRICLE RV Basal diam:  2.60 cm RV S prime:     9.36 cm/s TAPSE (M-mode): 1.2 cm LEFT ATRIUM             Index        RIGHT ATRIUM          Index LA diam:        3.00 cm 2.31 cm/m   RA Area:     8.08 cm LA Vol (A2C):   35.9 ml 27.67 ml/m  RA Volume:   13.90 ml 10.71 ml/m LA Vol (A4C):   56.4 ml 43.47 ml/m LA Biplane Vol: 49.1 ml 37.84 ml/m  AORTIC VALVE                 PULMONIC VALVE AV Area (Vmax): 1.98 cm     PV Vmax:        0.80 m/s AV Vmax:        133.00 cm/s  PV Peak grad:   2.6 mmHg AV Peak Grad:   7.1 mmHg     RVOT Peak grad: 2 mmHg LVOT Vmax:      83.80 cm/s LVOT Vmean:     52.200 cm/s LVOT VTI:       0.096 m  AORTA Ao Root diam: 3.20 cm Ao Asc diam:  2.80 cm MITRAL VALVE               TRICUSPID VALVE MV Area (PHT): 7.99 cm    TR Peak grad:   26.4 mmHg MV Area VTI:   0.18 cm    TR Vmax:        257.00 cm/s MV Peak grad:  118.8 mmHg MV Mean grad:  69.0 mmHg   SHUNTS MV Vmax:       5.45 m/s    Systemic VTI:  0.10 m MV Vmean:      383.0 cm/s  Systemic Diam: 2.00 cm MV Decel Time: 95 msec MR PISA:        1.57 cm MR PISA Radius: 0.50 cm MV E velocity: 48.70 cm/s MV A velocity: 84.40 cm/s MV E/A ratio:  0.58 Redell Cave MD Electronically signed by Redell Cave MD Signature Date/Time: 02/21/2024/12:55:12 PM    Final    DG Chest Portable 1 View Result Date: 02/20/2024 CLINICAL DATA:  Dyspnea and hypoxia. EXAM: PORTABLE CHEST 1 VIEW COMPARISON:  Portable chest  01/07/2024 FINDINGS: Interstitial and patchy airspace disease throughout the lung fields is noted with relative apical sparing and with a basal gradient, denser on the right. Given additional findings cardiomegaly and central vascular prominence, this is probably in total or at least mostly due to interstitial and alveolar pulmonary edema from CHF or fluid overload. There was a similar appearance on a portable chest from 12/20/2023 further suggesting an edematous etiology. Underlying pneumonia is possible in the proper clinical setting. Small pleural effusions are beginning to form. Central bronchi are increasingly  thickened. The mediastinum is stable. No new osseous findings. Overlying monitor wires. IMPRESSION: 1. Interstitial and patchy airspace disease throughout the lung fields with a basal gradient, denser on the right. Given additional findings cardiomegaly and central vascular prominence, this is probably in total or at least mostly due to interstitial and alveolar pulmonary edema from CHF or fluid overload. Underlying pneumonia is possible in the proper clinical setting. 2. Small pleural effusions are beginning to form. 3. Increasing central bronchial thickening. Electronically Signed   By: Francis Quam M.D.   On: 02/20/2024 07:21    Microbiology: Results for orders placed or performed during the hospital encounter of 02/20/24  Resp panel by RT-PCR (RSV, Flu A&B, Covid) Anterior Nasal Swab     Status: None   Collection Time: 02/20/24  6:34 AM   Specimen: Anterior Nasal Swab  Result Value Ref Range Status   SARS Coronavirus 2 by RT PCR NEGATIVE NEGATIVE Final    Comment: (NOTE) SARS-CoV-2 target nucleic acids are NOT DETECTED.  The SARS-CoV-2 RNA is generally detectable in upper respiratory specimens during the acute phase of infection. The lowest concentration of SARS-CoV-2 viral copies this assay can detect is 138 copies/mL. A negative result does not preclude SARS-Cov-2 infection and  should not be used as the sole basis for treatment or other patient management decisions. A negative result may occur with  improper specimen collection/handling, submission of specimen other than nasopharyngeal swab, presence of viral mutation(s) within the areas targeted by this assay, and inadequate number of viral copies(<138 copies/mL). A negative result must be combined with clinical observations, patient history, and epidemiological information. The expected result is Negative.  Fact Sheet for Patients:  BloggerCourse.com  Fact Sheet for Healthcare Providers:  SeriousBroker.it  This test is no t yet approved or cleared by the [place]  FDA and  has been authorized for detection and/or diagnosis of SARS-CoV-2 by FDA under an Emergency Use Authorization (EUA). This EUA will remain  in effect (meaning this test can be used) for the duration of the COVID-19 declaration under Section 564(b)(1) of the Act, 21 U.S.C.section 360bbb-3(b)(1), unless the authorization is terminated  or revoked sooner.       Influenza A by PCR NEGATIVE NEGATIVE Final   Influenza B by PCR NEGATIVE NEGATIVE Final    Comment: (NOTE) The Xpert Xpress SARS-CoV-2/FLU/RSV plus assay is intended as an aid in the diagnosis of influenza from Nasopharyngeal swab specimens and should not be used as a sole basis for treatment. Nasal washings and aspirates are unacceptable for Xpert Xpress SARS-CoV-2/FLU/RSV testing.  Fact Sheet for Patients: BloggerCourse.com  Fact Sheet for Healthcare Providers: SeriousBroker.it  This test is not yet approved or cleared by the [place]  FDA and has been authorized for detection and/or diagnosis of SARS-CoV-2 by FDA under an Emergency Use Authorization (EUA). This EUA will remain in effect (meaning this test can be used) for the duration of the COVID-19 declaration  under Section 564(b)(1) of the Act, 21 U.S.C. section 360bbb-3(b)(1), unless the authorization is terminated or revoked.     Resp Syncytial Virus by PCR NEGATIVE NEGATIVE Final    Comment: (NOTE) Fact Sheet for Patients: BloggerCourse.com  Fact Sheet for Healthcare Providers: SeriousBroker.it  This test is not yet approved or cleared by the [place]  FDA and has been authorized for detection and/or diagnosis of SARS-CoV-2 by FDA under an Emergency Use Authorization (EUA). This EUA will remain in effect (meaning this test can be used) for the duration of the COVID-19  declaration under Section 564(b)(1) of the Act, 21 U.S.C. section 360bbb-3(b)(1), unless the authorization is terminated or revoked.  Performed at [facility], [address] [place], [place] [address]     Labs: CBC: Recent Labs  Lab 02/20/24 [phone] 02/20/24 0918 02/21/24 0506  WBC 9.4 9.2 12.1*  NEUTROABS 5.1  --   --   HGB 13.6 13.4 12.0  HCT 43.5 41.5 36.9  MCV 91.6 86.6 85.0  PLT 356 339 364   Basic Metabolic Panel: Recent Labs  Lab 02/19/24 1331 02/20/24 0650 02/20/24 0918 02/21/24 0506 02/22/24 0454  NA 141 139  --  138 138  K 4.3 4.0  --  3.6 4.3  CL 109 108  --  103 101  CO2 23 22  --  26 27  GLUCOSE 120* 227*  --  113* 98  BUN 21 18  --  32* 36*  CREATININE 0.83 0.88 0.83 0.98 0.85  CALCIUM  8.9 8.5*  --  9.0 9.2  MG  --  2.3  --   --   --    Liver Function Tests: Recent Labs  Lab 02/20/24 0650 02/21/24 0506  AST 120* 31  ALT 86* 49*  ALKPHOS 94 70  BILITOT 0.9 1.2  PROT 7.0 6.4*  ALBUMIN 3.4* 3.1*   CBG: Recent Labs  Lab 02/20/24 2027  GLUCAP 305*    Discharge time spent: greater than 30 minutes.  This record has been created using Conservation officer, historic buildings. Errors have been sought and corrected,but may not always be located. Such creation errors do not reflect on the standard of care.    Signed: Amaryllis Dare, MD [facility] 02/22/2024

## 2024-02-22 NOTE — Progress Notes (Signed)
 Rounding Note   Patient Name: Penny Gill Date of Encounter: 02/22/2024  [facility] Cardiologist: Deatrice Cage, MD   Subjective Patient reports improvement in shortness of breath. She denies chest pain and palpitations. She appears euvolemic on exam. Poorly recorded I/Os but patient is down 8 lbs since admission. Plan to transition to oral diuretic tomorrow.   Scheduled Meds:  aspirin  EC  81 mg Oral Daily   atorvastatin   40 mg Oral QHS   carvedilol   3.125 mg Oral BID WC   DULoxetine   30 mg Oral Daily   feeding supplement  237 mL Oral TID BM   folic acid   1 mg Oral Daily   furosemide   40 mg Intravenous Daily   gabapentin   300 mg Oral QHS   heparin   5,000 Units Subcutaneous Q8H   losartan   12.5 mg Oral Daily   multivitamin with minerals  1 tablet Oral Daily   nicotine   7 mg Transdermal Daily   pneumococcal 20-valent conjugate vaccine  0.5 mL Intramuscular Tomorrow-1000   spironolactone   12.5 mg Oral Daily   thiamine   100 mg Oral Daily   Continuous Infusions:  PRN Meds: acetaminophen  **OR** acetaminophen , albuterol , ondansetron  **OR** ondansetron  (ZOFRAN ) IV   Vital Signs  Vitals:   02/22/24 0500 02/22/24 0500 02/22/24 0758 02/22/24 1141  BP:  96/73 110/85 (!) 88/63  Pulse:  91 87 89  Resp:  19    Temp:  98.1 F (36.7 C) 98.3 F (36.8 C) 98.3 F (36.8 C)  TempSrc:  Oral    SpO2:  98% 99% 99%  Weight: 35.2 kg     Height:        Intake/Output Summary (Last 24 hours) at 02/22/2024 1324 Last data filed at 02/22/2024 0900 Gross per 24 hour  Intake 240 ml  Output --  Net 240 ml      02/22/2024    5:00 AM 02/21/2024    4:57 AM 02/20/2024    6:43 AM  Last 3 Weights  Weight (lbs) 77 lb 9.6 oz 77 lb 9.6 oz 85 lb  Weight (kg) 35.2 kg 35.2 kg 38.556 kg      Telemetry Sinus rhythm with rare episodes of NSVT up to 6 beats - Personally Reviewed  Physical Exam  GEN: Frail appearing. No acute distress.   Neck: No JVD Cardiac: RRR, no murmurs, rubs,  or gallops.  Respiratory: Clear to auscultation bilaterally. GI: Soft, nontender, non-distended  MS: No edema; No deformity. Neuro:  Nonfocal  Psych: Normal affect   Labs High Sensitivity Troponin:   Recent Labs  Lab 02/20/24 0650 02/20/24 0918 02/20/24 1450 02/20/24 2004  TROPONINIHS 24* 35* 46* 41*     Chemistry Recent Labs  Lab 02/20/24 0650 02/20/24 0918 02/21/24 0506 02/22/24 0454  NA 139  --  138 138  K 4.0  --  3.6 4.3  CL 108  --  103 101  CO2 22  --  26 27  GLUCOSE 227*  --  113* 98  BUN 18  --  32* 36*  CREATININE 0.88 0.83 0.98 0.85  CALCIUM  8.5*  --  9.0 9.2  MG 2.3  --   --   --   PROT 7.0  --  6.4*  --   ALBUMIN 3.4*  --  3.1*  --   AST 120*  --  31  --   ALT 86*  --  49*  --   ALKPHOS 94  --  70  --   BILITOT  0.9  --  1.2  --   GFRNONAA >60 >60 >60 >60  ANIONGAP 9  --  9 10    Lipids No results for input(s): CHOL, TRIG, HDL, LABVLDL, LDLCALC, CHOLHDL in the last 168 hours.  Hematology Recent Labs  Lab 02/20/24 0650 02/20/24 0918 02/21/24 0506  WBC 9.4 9.2 12.1*  RBC 4.75 4.79 4.34  HGB 13.6 13.4 12.0  HCT 43.5 41.5 36.9  MCV 91.6 86.6 85.0  MCH 28.6 28.0 27.6  MCHC 31.3 32.3 32.5  RDW 13.3 13.2 13.2  PLT 356 339 364   Thyroid  No results for input(s): TSH, FREET4 in the last 168 hours.  BNP Recent Labs  Lab 02/20/24 0658  BNP 1,843.4*    DDimer No results for input(s): DDIMER in the last 168 hours.    Cardiac Studies  TTE 02/20/2024 1. Left ventricular ejection fraction, by estimation, is <20%. Left  ventricular ejection fraction by 2D MOD biplane is 15.6 %. The left  ventricle has severely decreased function. The left ventricle demonstrates  global hypokinesis. Left ventricular  diastolic parameters are consistent with Grade I diastolic dysfunction  (impaired relaxation).   2. Right ventricular systolic function is low normal. The right  ventricular size is normal.   3. Left atrial size was mildly dilated.    4. The mitral valve is normal in structure. Moderate mitral valve  regurgitation.   5. The aortic valve is tricuspid. Aortic valve regurgitation is not  visualized.   6. The inferior vena cava is normal in size with greater than 50%  respiratory variability, suggesting right atrial pressure of 3 mmHg.   TTE 01/09/2024  1. Left ventricular ejection fraction, by estimation, is 25 to 30%. The  left ventricle has severely decreased function. The left ventricle  demonstrates global hypokinesis. The left ventricular internal cavity size  was mildly dilated. Left ventricular  diastolic function could not be evaluated.   2. Right ventricular systolic function is mildly reduced. The right  ventricular size is normal. There is mildly elevated pulmonary artery  systolic pressure. The estimated right ventricular systolic pressure is  41.4 mmHg.   3. Left atrial size was severely dilated.   4. PFO is present by color doppler but bubble is negative. Suspect  elevated LAP is reason fro negative bubble. Agitated saline contrast  bubble study was negative, with no evidence of any interatrial shunt.  There is a small patent foramen ovale with  predominantly left to right shunting across the atrial septum.   5. Right atrial size was mildly dilated.   6. Severe MR is present due to restricted PMVL from cardiomyopathy. The  mitral valve is abnormal. Severe mitral valve regurgitation. No evidence  of mitral stenosis.   7. The aortic valve is tricuspid. Aortic valve regurgitation is not  visualized. No aortic stenosis is present.   8. The inferior vena cava is normal in size with greater than 50%  respiratory variability, suggesting right atrial pressure of 3 mmHg.    LHC/RHC 12/22/2023 No angiographically significant coronary artery disease.  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 Normal left and right heart filling pressures (RA 3, RV 26/3, PCWP 8, LVEDP 10 mmHg). Upper normal  pulmonary artery pressure with elevated vascular resistance (PA 27/14, mean 18 mmHg; PVR 4 WU). Mildly reduced Fick cardiac index (CO 2.5 L/min, CI 2.1 L/min/m^2).  Patient Profile   63 y.o. female with a history of cocaine abuse, systolic heart failure, T2DM, hypertension, and COPD who is being seen  for ongoing management of acute on chronic HFrEF.   Assessment & Plan   Acute on chronic HFrEF Nonischemic cardiomyopathy - Prior echo 12/2023 with EF 25-30%, LHC 11/2023 with normal coronary arteries - Presented 6/28 with worsening dyspnea on exertion - Echo this admission with EF < 20% - Not a candidate for advanced therapies with ongoing cocaine use - Reports running out of some of her home medications prior to admission - Aggressively diuresed with improvement in dyspnea, I/Os poorly recorded. Weight down 8 lbs since admission.  - Appears euvolemic on exam - Hypotension preventing further titration of GDMT - Continue carvedilol  3.125 mg twice daily, losartan  12.5 mg daily, and spironolactone  12.5 mg daily - Will transition from IV to oral Lasix  20 mg daily as needed  Essential hypertension - BP soft, continue to monitor - Continue carvedilol  and losartan  as above  Cocaine abuse - UDS positive for cocaine - Recommend cessation  Hyperlipidemia - Continue atorvastatin  40 mg daily  For questions or updates, please contact [facility] Please consult [email or web address] for contact info under     Signed, Lesley LITTIE Maffucci, PA-C  02/22/2024, 1:24 PM

## 2024-02-22 NOTE — Progress Notes (Addendum)
 Heart Failure Navigator Progress Note  Patient provided with an appointment card for her prior scheduled [facility] appointment on 02/29/2024 @ 2:30 PM as a reminder. Patient with no other needs currently.   Patient was consulted for Advanced Heart Failure Team on 02/22/24 after the above note.  Navigator will sign off at this time.  Charmaine Pines, RN, BSN [facility] Heart Failure Navigator Secure Chat Only

## 2024-02-22 NOTE — Telephone Encounter (Signed)
 Patient Product/process development scientist completed.    The patient is insured through [employer] [place].     Ran test claim for Entersto 24-26 mg and the current 30 day co-pay is $4.00.  Ran test claim for Farxiga  10 mg and the current 30 day co-pay is $4.00.  Ran test claim for Jardiance 10 mg and the current 30 day co-pay is $4.00.  This test claim was processed through  [facility]- copay amounts may vary at other pharmacies due to pharmacy/plan contracts, or as the patient moves through the different stages of their insurance plan.     Reyes Sharps, CPHT Pharmacy Technician III Certified Patient Advocate [facility] Patient Advocate Team Direct Number: [phone]  Fax: [phone]
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
## 2024-02-22 NOTE — Assessment & Plan Note (Addendum)
 Patient with history of EF of 20 to 30%, elevated BNP and chest x-ray with concern of pulmonary edema. Cardiac cath in April 2025 with nonischemic cardiomyopathy.  Developing some azotemia and blood pressure becoming softer.  Now on room air and clinically looks euvolemic. Question of compliance with ongoing cocaine use. Cardiology was consulted - Repeat echocardiogram with no acute abnormality, EF remain less than 20% and grade 1 diastolic dysfunction. -She received a dose of IV Lasix  today and will start 20 mg of daily Lasix  along with other heart failure medications.

## 2024-02-22 NOTE — TOC Progression Note (Signed)
 Transition of Care [facility]) - Progression Note    Patient Details  Name: Penny Gill MRN: [id number] Date of Birth: Oct 02, 1960  Transition of [facility]) CM/SW Contact  Tomasa JAYSON Childes, RN Phone Number: 02/22/2024, 1:14 PM  Clinical Narrative:    Spoke with patient at the bedside. She stated she wanted to move from where she lived. Patient is on the [employer] but is requesting a move to housing in [place]. She was advised to contact the housing authority. Patient inquired about her CPAP. She stated it was ordered by her pulmonologist and had been requested but she has not  heard anything about delivery.   Per outpatient encounter from pulmonary visit 6/20 no information located about CPAP found.  Contact Mitch from [employer]. No information located regarding CPAP.         Expected Discharge Plan and Services                                               Social Determinants of Health (SDOH) Interventions SDOH Screenings   Food Insecurity: No Food Insecurity (02/20/2024)  Housing: Low Risk  (02/20/2024)  Transportation Needs: No Transportation Needs (02/20/2024)  Utilities: Not At Risk (02/20/2024)  Depression (PHQ2-9): Low Risk  (01/11/2020)  Financial Resource Strain: Medium Risk (12/22/2023)  Physical Activity: Not on File (09/10/2021)   Received from [facility]  Social Connections: Moderately Integrated (02/20/2024)  Stress: Not on File (09/10/2021)   Received from [facility]  Tobacco Use: High Risk (02/20/2024)    Readmission Risk Interventions     No data to display

## 2024-02-22 NOTE — Discharge Instructions (Addendum)
Heart Failure Nutrition Therapy For The Undernourished  This nutrition therapy will help you eat more calories and protein in addition to helping your heart. These suggestions can help you if you can't eat enough, have lost weight, or need extra calories and protein in your diet. This plan focuses on: Eating more calories.  Eating more calories can give you more energy, help you gain weight, and promote wound healing. Eating more protein. Protein can help you heal, give you energy, and build and repair muscle. Ask your registered dietitian nutritionist (RDN) how much protein is the right amount for you. Eating a low-sodium diet while increasing your calorie and protein intake. This will help control buildup of fluids around your heart, stomach, lungs, and legs. Too much sodium may make your blood pressure too high and put stress on your heart. You can achieve these goals by: Eating more calories and protein. Eating less than 2,000 milligrams of sodium per day. Reading food labels to keep track of how much sodium, protein, and calories are in the foods you eat. Limiting fluid intake if your doctor has asked you to follow a fluid restriction. Reading the Food Label: How Much Sodium Is too Much? The nutrition plan for heart failure usually limits the sodium that you get from food and beverages to 2,000 milligrams per day. Salt is the main source of sodium. Read the nutrition label to find out how much sodium is in 1 serving. Foods with more than 300 milligrams of sodium per serving may not fit into a reduced-sodium meal plan. Check serving sizes on the label. If you eat more than 1 serving, you will get more sodium than the amount listed. You can find protein and calories on the Nutrition Facts label too. Eating More Protein and Calories Eat at least 6 small meals throughout the day. If you become full quickly after you begin eating, you may benefit from small, frequent meals instead of 3 large  meals. Keep high-calorie and high-protein snacks available in your car or bag for when you get hungry. Add extra calories and protein when cooking meals. Cook with unsalted butter, margarine, or oils instead of calorie-free cooking spray. Use reduced-fat (2%) milk instead of fat-free (skim) milk. Ask your RDN if whole milk is recommended for you. Add milk powder to protein shakes, cereal, or casseroles. Sprinkle unsalted nuts and seeds into your cereal, stir-fry, or salad. Add 1 tablespoon mayonnaise, sugar, or honey to foods (adds 50 to 100 calories). Add calories and protein to your fruits and vegetables. Fruits are low in calories. Add peanut butter, yogurt, or cottage cheese to add calories and protein. Buy fruit cups canned in syrup instead of water or juice. Vegetables are also low in calories. Add butter, sour cream, margarine, or oil for extra calories. Potatoes, corn, and peas are higher in calories than nonstarchy vegetables. Avocados are rich in calories and healthy fat. Eat them alone or use them as a topping or spread. Limit foods that have little to no nutrition. Eat fewer calorie-free and low-calorie foods such as applesauce, Jell-O, and diet soft drinks. These foods will take up space in your stomach and may leave little room for higher-calorie foods and beverages.  When shopping, avoid products that say "low calorie" or "low fat." Drink high-calorie beverages. There are several liquid nutrition supplements available that also have less than 300 milligrams of sodium per serving. Drink them between meals as snacks.  Make your own supplement at home with milk or ice   cream.  Ask your RDN if protein powder would also be a good addition. Milk and juice are high in calories. Limit plain tea, coffee, and water. These have no calories.  Foods Recommended Food Group Foods Recommended  Grains Whole wheat bread with less than 80 milligrams of sodium per slice  Many cold cereals,  especially shredded wheat and granola Oats or cream of wheat made with milk (add butter or margarine, sugar, dried fruit, and nuts for more calories and fat) Quinoa Wheat germ (sprinkle on soups, baked goods, or cereal)  Vegetables Fresh and frozen vegetables without added sauces, salt, or sodium Homemade soups (salt free or low sodium) with dry milk powder or cream Potatoes, corn, and peas  Fruits Canned fruits (canned in heavy syrup) Dried fruits, such as raisins, cranberries, and prunes Avocados  Dairy (Milk and Milk Products) Milk or milk powder Soy milk Yogurt, including Greek yogurt Small amounts of natural, blocked cheeses or reduced-sodium cheese (Swiss, ricotta, and fresh mozzarella are lower in sodium than others) Regular or soft cream cheese and low-sodium cottage cheese  Protein Foods (Meat, Poultry, Fish, and Beans) Fresh meat and fish Turkey bacon (check the Nutrition Facts label to make sure its not packaged in a sodium solution) Tuna canned or packed in oil Dried beans, peas, and legumes; edamame (fresh soybeans) Eggs Unsalted nuts or peanut butter  Desserts and Snacks Apple (with peanut butter); baked apple with sugar and butter             Granola bars Pudding made with reduced-fat (2%) milk or milk powder Smoothies  Custard Chocolate syrup added to milk or ice cream  Fats Tub or liquid margarine (trans fat-free) Butter (check with your doctor or RDN first) Unsaturated fat oils (canola, olive, corn, sunflower, safflower, peanut, vegetable, and soybean) Flaxseed (oil or ground)  Sodium-Free Condiments Fresh or dried herbs; pepper; vinegar; lemon juice or lime juice; salt-free seasoning mixes and marinades such as Mrs. Dash or McCormick's salt-free blend; simple salad dressings such as vinegar and oil; low-sodium ketchup. You can purchase salt-free barbecue sauce and many others on the Internet.  Ask your RDN.     Foods Not Recommended Food Group Foods Not  Recommended  Grains Breads or crackers topped with salt Cereals (hot/cold) with more than 300 milligrams sodium per serving Biscuits, cornbread, and other "quick" breads prepared with baking soda Prepackaged bread crumbs Self-rising flours  Vegetables Canned vegetables (unless they are salt free or low sodium) Frozen vegetables with high-sodium seasoning and sauces Sauerkraut and pickled vegetables Canned or dried soups (unless they are salt free or low  sodium) French fries and onion rings Broth-based soups  Fruits Dried fruits preserved with sodium-containing additives  Dairy (Milk and Milk Products) Buttermilk Processed cheeses such as Cheese Wiz, Velveeta, and Queso Feta cheese Shredded cheese (has more sodium than blocked cheeses) "Singles" cheese slices and string cheese  Protein Foods (Meat, Poultry, Fish, and Beans) Cured meats (bacon, ham, sausage, pepperoni, and hot dogs)  Canned meats (chili, Vienna sausage, sardines, and Spam) Smoked fish and meats Frozen meals with more than 600 milligrams of sodium Egg Beaters   Fats Salted butter or margarine  Condiments Salt, sea salt, kosher salt, onion salt, and garlic salt Seasoning mixes containing salt such as Lemon Pepper or Lawry's Bouillon cubes Catsup or ketchup Barbecue sauce and Worcestershire sauce Soy sauce Salsa, pickles, olives, relish Salad dressings: ranch, blue cheese, Italian, and French  Alcohol Check with your doctor.   Heart Failure (  Undernourished) Sample 1-Day Menu View Nutrient Info Breakfast 1 cup oatmeal made with milk 1 tablespoon wheat germ (sprinkle on oatmeal) 1 cup (240 milliliters) reduced-fat (2%) milk 2 tablespoons chocolate syrup (add to milk) 1 banana 2 tablespoons peanut butter (for banana)  Morning Snack 1/4 cup raw almonds Cranberries (add to almonds) 1 cup (240 milliliters) oral nutritional supplement  Lunch 3 ounces grilled chicken breast 1 small potato 2 ounces cheese (to add to  potato) 2 tablespoons margarine (to add to potato) Croutons (add to salad) Walnuts (add to salad) Vinegar and oil dressing (add to salad) 1 cup (240 milliliters) juice  Afternoon Snack 10 tortilla chips Guacamole (avocado, lime juice, onion, and tomatoes, and pepper) 1 cup (240 milliliters) water or juice  Evening Meal 3 ounces herb-baked fish 1 cup homemade mashed potatoes with margarine (trans fat-free) and milk 1/2 cup creamed spinach 1 cup (240 milliliters) water or juice  Evening Snack 1 cup (240 milliliters) homemade milkshake 1 slice low-sodium turkey breast 1 slice whole wheat bread 1 slice cheese Mayonnaise  Daily Sum Nutrient Unit Value  Macronutrients  Energy kcal 3461  Energy kJ 14480  Protein g 151  Total lipid (fat) g 158  Carbohydrate, by difference g 381  Fiber, total dietary g 36  Sugars, total g 163  Minerals  Calcium, Ca mg 2598  Iron, Fe mg 32  Sodium, Na mg 2530  Vitamins  Vitamin C, total ascorbic acid mg 180  Vitamin A, IU IU 9009  Vitamin D IU 575  Lipids  Fatty acids, total saturated g 43  Fatty acids, total monounsaturated g 56  Fatty acids, total polyunsaturated g 48  Cholesterol mg 252     Heart Failure (Undernourished) Vegan Sample 1-Day Menu View Nutrient Info Breakfast 1 cup oatmeal made with: 1 cup soymilk fortified with calcium, vitamin B12, and vitamin D 2 tablespoons walnuts, unsalted 1 banana 1 cup orange juice with added calcium and vitamin D  Morning Snack  cup almond butter, unsalted 1 apple  cup granola  cup soy yogurt  Lunch 1 black bean burger, low sodium 1 hamburger bun 1 cup lettuce for salad with:  cup cucumbers, sliced  cup carrots, shredded 1 tablespoon cashews, unsalted 1 tablespoon sesame seed dressing, low sodium  cup pineapple  Afternoon Snack 1 pita, whole wheat  cup hummus  cup grapes  Evening Meal 1 cup cooked rice 1 cup red beans, cooked 1 cup corn, cooked  cup cucumber slices  cup  tomato, diced  Evening Snack Smoothie made with: 1 cup soymilk fortified with calcium, vitamin B12, and vitamin D 1 scoop soy protein powder, for smoothie 1 cup strawberries, for smoothie  Daily Sum Nutrient Unit Value  Macronutrients  Energy kcal 2831  Energy kJ 11850  Protein g 121  Total lipid (fat) g 85  Carbohydrate, by difference g 428  Fiber, total dietary g 71  Sugars, total g 154  Minerals  Calcium, Ca mg 2162  Iron, Fe mg 45  Sodium, Na mg 2054  Vitamins  Vitamin C, total ascorbic acid mg 296  Vitamin A, IU IU 17869  Vitamin D IU 318  Lipids  Fatty acids, total saturated g 9  Fatty acids, total monounsaturated g 36  Fatty acids, total polyunsaturated g 30  Cholesterol mg 0     Heart Failure (Undernourished) Vegetarian (Lacto-Ovo) Sample 1-Day Menu View Nutrient Info Breakfast 1 cup oatmeal made with: 1 cup 2% milk 1 slice toast, whole wheat topped with:  cup   avocado 1 cup orange juice with added calcium and vitamin D  Morning Snack  cup almonds, unsalted 2 tablespoons dried cranberries (add to almonds) 1 cup Greek yogurt, plain  Lunch Sandwich made with: 2 slices bread, whole wheat 2 tablespoons peanut butter, unsalted 1 tablespoon jam 4 carrot sticks with: 2 tablespoons hummus 1 banana  Afternoon Snack  cup granola  cup peanuts, unsalted 1 apple  Evening Meal 1 cup black bean soup, low sodium  cup tofu, cooked  cup cooked brown rice  cup broccoli, cooked  cup carrots, cooked  cup onions, cooked 2 tablespoons peanut oil  Evening Snack 5 crackers, whole wheat, low sodium 3 dried figs 1 cup 2% milk  Daily Sum Nutrient Unit Value  Macronutrients  Energy kcal 3020  Energy kJ 12636  Protein g 117  Total lipid (fat) g 133  Carbohydrate, by difference g 375  Fiber, total dietary g 58  Sugars, total g 148  Minerals  Calcium, Ca mg 2197  Iron, Fe mg 29  Sodium, Na mg 1434  Vitamins  Vitamin C, total ascorbic acid mg 174  Vitamin A, IU  IU 20295  Vitamin D IU 317  Lipids  Fatty acids, total saturated g 27  Fatty acids, total monounsaturated g 62  Fatty acids, total polyunsaturated g 34  Cholesterol mg 63    Copyright 2020  [employer]. All rights reserved  

## 2024-02-23 LAB — BLOOD GAS, VENOUS
Bicarbonate: 14.3 mmol/L — ABNORMAL LOW (ref 20.0–28.0)
O2 Saturation: 45.1 mmol/L — AB (ref 0.0–2.0)
Patient temperature: 37
Patient temperature: 45.1
pCO2, Ven: 35 mmHg — ABNORMAL LOW (ref 44–60)
pH, Ven: 7.22 — ABNORMAL LOW (ref 7.25–7.43)
pO2, Ven: 31 mmol/L — CL (ref 32–45)

## 2024-02-25 ENCOUNTER — Telehealth: Payer: Self-pay

## 2024-02-25 ENCOUNTER — Telehealth: Payer: Self-pay | Admitting: Family

## 2024-02-25 NOTE — Telephone Encounter (Signed)
   Name: Penny Gill  DOB: April 14, 1961  MRN: [id number]  Primary Cardiologist: Redell Cave, MD  Chart reviewed as part of pre-operative protocol coverage. The patient has an upcoming visit scheduled with Ellouise Class, FNP on 02/29/2024 at which time clearance can be addressed in case there are any issues that would impact surgical recommendations.  C3/4 ACDF is not scheduled until TBD as below. I added preop FYI to appointment note so that provider is aware to address at time of outpatient visit.  Per office protocol the cardiology provider should forward their finalized clearance decision and recommendations regarding antiplatelet therapy to the requesting party below.    Per office protocol, if patient is without any new symptoms or concerns at the time of their visit, she may hold aspirin  for 5-7 days prior to procedure. Please resume aspirin  as soon as possible postprocedure, at the discretion of the surgeon.    I will route this message as FYI to requesting party and remove this message from the preop box as separate preop APP input not needed at this time.   Please call with any questions.  Lum LITTIE Louis, NP  02/25/2024, 11:42 AM

## 2024-02-25 NOTE — Telephone Encounter (Signed)
 Called to confirm/remind patient of their appointment at the [facility] on 02/29/24.   Appointment:   [] Confirmed  [] Left mess   [] No answer/No voice mail  [] VM Full/unable to leave message  [x] Phone not in service  Patient reminded to bring all medications and/or complete list.  Confirmed patient has transportation. Gave directions, instructed to utilize valet parking.

## 2024-02-25 NOTE — Telephone Encounter (Signed)
   Pre-operative Risk Assessment    Patient Name: Penny Gill  DOB: 1961-01-02 MRN: [id number]   Date of last office visit: 01/19/24 Penny CLASS, FNP [facility]) Date of next office visit: 03/25/24 Penny Gill (NEW PT APPT)   Request for Surgical Clearance    Procedure:  C3/4 ACDF  Date of Surgery:  Clearance TBD                                Surgeon:  OZELL CHARD, MD Surgeon's Group or Practice Name:  [facility] Phone number:  [phone] Fax number:  [phone]  ATTN: Penny Gill - NURSE COORDINATOR   Type of Clearance Requested:   - Medical  - Pharmacy:  Hold Aspirin      Type of Anesthesia:  General    Additional requests/questions:  (NOTE ON CLEARANCE) SURGEON IS CONCERNED ABOUT EF< 20% & ABILITY TO [facility] SURGERY  Signed, Lucie DELENA Ku   02/25/2024, 9:57 AM
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
## 2024-02-29 ENCOUNTER — Encounter: Payer: MEDICAID | Admitting: Family

## 2024-03-02 ENCOUNTER — Telehealth: Payer: Self-pay | Admitting: Family

## 2024-03-02 NOTE — Telephone Encounter (Signed)
 Called to confirm/remind patient of their appointment at the [facility] on 03/03/24.   Appointment:   [x] Confirmed  [] Left mess   [] No answer/No voice mail  [] VM Full/unable to leave message  [] Phone not in service  Patient reminded to bring all medications and/or complete list.  Confirmed patient has transportation. Gave directions, instructed to utilize valet parking.

## 2024-03-03 ENCOUNTER — Other Ambulatory Visit
Admission: RE | Admit: 2024-03-03 | Discharge: 2024-03-03 | Disposition: A | Discharge status: Home / Self Care | Payer: MEDICAID | Source: Ambulatory Visit | Attending: Family | Admitting: Family

## 2024-03-03 ENCOUNTER — Encounter: Payer: Self-pay | Admitting: Family

## 2024-03-03 ENCOUNTER — Ambulatory Visit: Payer: Self-pay | Admitting: Family

## 2024-03-03 ENCOUNTER — Other Ambulatory Visit: Payer: Self-pay

## 2024-03-03 ENCOUNTER — Ambulatory Visit: Payer: MEDICAID | Admitting: Family

## 2024-03-03 VITALS — BP 138/99 | HR 106 | Wt 74.2 lb

## 2024-03-03 DIAGNOSIS — I5022 Chronic systolic (congestive) heart failure: Secondary | ICD-10-CM

## 2024-03-03 DIAGNOSIS — E785 Hyperlipidemia, unspecified: Secondary | ICD-10-CM | POA: Diagnosis present

## 2024-03-03 DIAGNOSIS — F1910 Other psychoactive substance abuse, uncomplicated: Secondary | ICD-10-CM

## 2024-03-03 DIAGNOSIS — I10 Essential (primary) hypertension: Secondary | ICD-10-CM

## 2024-03-03 DIAGNOSIS — M542 Cervicalgia: Secondary | ICD-10-CM

## 2024-03-03 DIAGNOSIS — E1140 Type 2 diabetes mellitus with diabetic neuropathy, unspecified: Secondary | ICD-10-CM | POA: Diagnosis present

## 2024-03-03 LAB — CBC
HCT: 37.2 % (ref 36.0–46.0)
Hemoglobin: 12.2 g/dL (ref 12.0–15.0)
MCH: 28.4 pg (ref 26.0–34.0)
MCHC: 32.8 g/dL (ref 30.0–36.0)
MCV: 86.5 fL (ref 80.0–100.0)
Platelets: 326 K/uL (ref 150–400)
RBC: 4.3 MIL/uL (ref 3.87–5.11)
RDW: 13.8 % (ref 11.5–15.5)
WBC: 7.9 K/uL (ref 4.0–10.5)
nRBC: 0 % (ref 0.0–0.2)

## 2024-03-03 LAB — LIPID PANEL
Cholesterol: 225 mg/dL — ABNORMAL HIGH (ref 0–200)
HDL: 70 mg/dL (ref >40)
LDL Cholesterol: 140 mg/dL — ABNORMAL HIGH (ref 0–99)
Total CHOL/HDL Ratio: 3.2 ratio
Triglycerides: 77 mg/dL (ref <150)
VLDL: 15 mg/dL (ref 0–40)

## 2024-03-03 LAB — BASIC METABOLIC PANEL WITH GFR
Anion gap: 13 (ref 5–15)
BUN: 19 mg/dL (ref 8–23)
CO2: 24 mmol/L (ref 22–32)
Calcium: 9.6 mg/dL (ref 8.9–10.3)
Chloride: 102 mmol/L (ref 98–111)
Creatinine, Ser: 0.77 mg/dL (ref 0.44–1.00)
GFR, Estimated: >60 mL/min (ref >60)
Glucose, Bld: 79 mg/dL (ref 70–99)
Potassium: 3.8 mmol/L (ref 3.5–5.1)
Sodium: 139 mmol/L (ref 135–145)

## 2024-03-03 LAB — BRAIN NATRIURETIC PEPTIDE: B Natriuretic Peptide: 2371.1 pg/mL — ABNORMAL HIGH (ref 0.0–100.0)

## 2024-03-03 MED ORDER — SPIRONOLACTONE 25 MG PO TABS
12.5000 mg | ORAL_TABLET | Freq: Every day | ORAL | 5 refills | Status: DC
  Filled 2024-03-03 – 2024-04-21 (×3): qty 30, 60d supply, fill #0

## 2024-03-03 MED ORDER — CARVEDILOL 6.25 MG PO TABS: 6.2500 mg | ORAL_TABLET | Freq: Two times a day (BID) | ORAL | 3 refills | Status: DC

## 2024-03-03 MED ORDER — VITAMIN B-1 100 MG PO TABS: 100.0000 mg | ORAL_TABLET | Freq: Every day | ORAL | 5 refills | Status: DC

## 2024-03-03 MED ORDER — ASPIRIN 81 MG PO TBEC: 81.0000 mg | DELAYED_RELEASE_TABLET | Freq: Every day | ORAL | 5 refills | Status: DC

## 2024-03-03 MED ORDER — LOSARTAN POTASSIUM 25 MG PO TABS: 12.5000 mg | ORAL_TABLET | Freq: Every day | ORAL | 5 refills | Status: DC

## 2024-03-03 MED ORDER — ASPIRIN 81 MG PO TBEC
81.0000 mg | DELAYED_RELEASE_TABLET | Freq: Every day | ORAL | 5 refills | Status: DC
  Filled 2024-03-03: qty 30, 30d supply, fill #0
  Filled 2024-04-21: qty 30, 30d supply, fill #1
  Filled 2024-06-10: qty 30, 30d supply, fill #2
  Filled 2024-07-25: qty 30, 30d supply, fill #3

## 2024-03-03 MED ORDER — LOSARTAN POTASSIUM 25 MG PO TABS
12.5000 mg | ORAL_TABLET | Freq: Every day | ORAL | 5 refills | Status: DC
  Filled 2024-03-03: qty 30, 60d supply, fill #0
  Filled 2024-06-10: qty 30, 60d supply, fill #1

## 2024-03-03 MED ORDER — VITAMIN B-1 100 MG PO TABS
100.0000 mg | ORAL_TABLET | Freq: Every day | ORAL | 5 refills | Status: DC
  Filled 2024-03-03: qty 30, 30d supply, fill #0
  Filled 2024-04-21: qty 30, 30d supply, fill #1
  Filled 2024-06-10: qty 30, 30d supply, fill #2
  Filled 2024-07-25: qty 30, 30d supply, fill #3

## 2024-03-03 MED ORDER — SPIRONOLACTONE 25 MG PO TABS: 12.5000 mg | ORAL_TABLET | Freq: Every day | ORAL | 5 refills | Status: DC

## 2024-03-03 MED ORDER — CARVEDILOL 6.25 MG PO TABS
6.2500 mg | ORAL_TABLET | Freq: Two times a day (BID) | ORAL | 5 refills | Status: DC
  Filled 2024-03-03 – 2024-04-21 (×3): qty 60, 30d supply, fill #0
  Filled 2024-07-25: qty 60, 30d supply, fill #1
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
## 2024-03-03 NOTE — Patient Instructions (Signed)
 Medication Changes:  INCREASE COREG  TO 6.25 MG TWICE DAILY   Lab Work:  Go over to the [facility]. Go pass the gift shop and have your blood work completed.  We will only call you if the results are abnormal or if the provider would like to make medication changes.    Follow-Up in: 2 WEEKS WITH ELLOUISE CLASS, FNP  At the [facility], you and your health needs are our priority. We have a designated team specialized in the treatment of Heart Failure. This Care Team includes your primary Heart Failure Specialized Cardiologist (physician), Advanced Practice Providers (APPs- Physician Assistants and Nurse Practitioners), and Pharmacist who all work together to provide you with the care you need, when you need it.   You may see any of the following providers on your designated Care Team at your next follow up:  Dr. Toribio Fuel Dr. Ezra Shuck Dr. Ria Commander Dr. Odis Brownie ELLOUISE CLASS, FNP Jaun Bash, RPH-CPP  Please be sure to bring in all your medications bottles to every appointment.   Need to Contact Us :  If you have any questions or concerns before your next appointment please send us  a message through [facility] or call our office at [phone].    TO LEAVE A MESSAGE FOR THE NURSE SELECT OPTION 2, PLEASE LEAVE A MESSAGE INCLUDING: YOUR NAME DATE OF BIRTH CALL BACK NUMBER REASON FOR CALL**this is important as we prioritize the call backs  YOU WILL RECEIVE A CALL BACK THE SAME DAY AS LONG AS YOU CALL BEFORE 4:00 PM

## 2024-03-03 NOTE — Addendum Note (Signed)
 Addended by: Khali Albanese A on: 03/03/2024 03:22 PM   Modules accepted: Orders

## 2024-03-07 ENCOUNTER — Other Ambulatory Visit: Payer: Self-pay | Admitting: Family

## 2024-03-07 ENCOUNTER — Other Ambulatory Visit: Payer: Self-pay

## 2024-03-08 ENCOUNTER — Other Ambulatory Visit: Payer: Self-pay

## 2024-03-08 MED FILL — Folic Acid Tab 1 MG: ORAL | 30 days supply | Qty: 30 | Fill #0 | Status: CN

## 2024-03-10 ENCOUNTER — Other Ambulatory Visit: Payer: Self-pay

## 2024-03-18 ENCOUNTER — Other Ambulatory Visit: Payer: Self-pay

## 2024-03-24 ENCOUNTER — Telehealth: Payer: Self-pay | Admitting: Sleep Medicine

## 2024-03-24 NOTE — Telephone Encounter (Signed)
 Dr. Jess saw this patient on 02/12/24 and order new cpap setup. The order was sent to [facility]. We received a note from [facility] they have made multiple attempts to contact the patient to schedule cpap setup. I tried to contact the patient today and the phone rings, rings then states your call can not be completed at this time

## 2024-03-25 ENCOUNTER — Ambulatory Visit: Payer: MEDICAID | Attending: Cardiology | Admitting: Cardiology

## 2024-03-29 ENCOUNTER — Telehealth (INDEPENDENT_AMBULATORY_CARE_PROVIDER_SITE_OTHER): Payer: MEDICAID | Admitting: Sleep Medicine

## 2024-03-29 NOTE — Telephone Encounter (Signed)
 PSG results discussed with patient.

## 2024-03-30 ENCOUNTER — Telehealth: Payer: Self-pay | Admitting: Family
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
## 2024-03-30 NOTE — Telephone Encounter (Signed)
 Called to confirm/remind patient of their appointment at the [facility] on 03/31/24.   Appointment:   [] Confirmed  [] Left mess   [x] No answer/No voice mail  [] VM Full/unable to leave message  [] Phone not in service  Patient reminded to bring all medications and/or complete list.  Confirmed patient has transportation. Gave directions, instructed to utilize valet parking.

## 2024-03-31 ENCOUNTER — Telehealth: Payer: Self-pay | Admitting: Family

## 2024-03-31 ENCOUNTER — Encounter: Payer: MEDICAID | Admitting: Family

## 2024-03-31 NOTE — Telephone Encounter (Signed)
 Patient did not show for her Heart Failure Clinic appointment on 03/31/24

## 2024-04-14 ENCOUNTER — Telehealth: Payer: Self-pay | Admitting: Family

## 2024-04-14 NOTE — Telephone Encounter (Signed)
 Called to confirm/remind patient of their appointment at the [facility] on 04/15/24.   Appointment:   [x] Confirmed  [] Left mess   [] No answer/No voice mail  [] VM Full/unable to leave message  [] Phone not in service  Patient reminded to bring all medications and/or complete list.  Confirmed patient has transportation. Gave directions, instructed to utilize valet parking.

## 2024-04-15 ENCOUNTER — Encounter: Payer: MEDICAID | Admitting: Family
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
## 2024-04-18 ENCOUNTER — Encounter: Payer: MEDICAID | Admitting: Family

## 2024-04-21 ENCOUNTER — Other Ambulatory Visit: Payer: Self-pay

## 2024-04-21 MED FILL — Folic Acid Tab 1 MG: ORAL | 30 days supply | Qty: 30 | Fill #0 | Status: AC

## 2024-04-26 ENCOUNTER — Telehealth: Payer: Self-pay | Admitting: Family

## 2024-04-26 NOTE — Telephone Encounter (Signed)
 Called to confirm/remind patient of their appointment at the [facility] Clinic on 04/27/24.   Appointment:   [x] Confirmed  [] Left mess   [] No answer/No voice mail  [] VM Full/unable to leave message  [] Phone not in service  Patient reminded to bring all medications and/or complete list.  Confirmed patient has transportation. Gave directions, instructed to utilize valet parking.

## 2024-04-27 ENCOUNTER — Other Ambulatory Visit
Admission: RE | Admit: 2024-04-27 | Discharge: 2024-04-27 | Disposition: A | Discharge status: Home / Self Care | Payer: MEDICAID | Source: Ambulatory Visit | Attending: Family | Admitting: Family

## 2024-04-27 ENCOUNTER — Encounter: Payer: Self-pay | Admitting: Family

## 2024-04-27 ENCOUNTER — Other Ambulatory Visit: Payer: Self-pay

## 2024-04-27 ENCOUNTER — Ambulatory Visit (HOSPITAL_BASED_OUTPATIENT_CLINIC_OR_DEPARTMENT_OTHER): Payer: MEDICAID | Admitting: Family

## 2024-04-27 ENCOUNTER — Ambulatory Visit: Payer: Self-pay | Admitting: Family

## 2024-04-27 VITALS — BP 141/99 | HR 89 | Wt 79.0 lb

## 2024-04-27 DIAGNOSIS — G4733 Obstructive sleep apnea (adult) (pediatric): Secondary | ICD-10-CM

## 2024-04-27 DIAGNOSIS — I10 Essential (primary) hypertension: Secondary | ICD-10-CM

## 2024-04-27 DIAGNOSIS — I5022 Chronic systolic (congestive) heart failure: Secondary | ICD-10-CM | POA: Diagnosis present

## 2024-04-27 DIAGNOSIS — F1910 Other psychoactive substance abuse, uncomplicated: Secondary | ICD-10-CM

## 2024-04-27 DIAGNOSIS — M542 Cervicalgia: Secondary | ICD-10-CM

## 2024-04-27 DIAGNOSIS — E785 Hyperlipidemia, unspecified: Secondary | ICD-10-CM

## 2024-04-27 DIAGNOSIS — E1140 Type 2 diabetes mellitus with diabetic neuropathy, unspecified: Secondary | ICD-10-CM | POA: Diagnosis not present

## 2024-04-27 LAB — BASIC METABOLIC PANEL WITH GFR
Anion gap: 12 (ref 5–15)
BUN: 19 mg/dL (ref 8–23)
CO2: 23 mmol/L (ref 22–32)
Calcium: 9.1 mg/dL (ref 8.9–10.3)
Chloride: 103 mmol/L (ref 98–111)
Creatinine, Ser: 0.91 mg/dL (ref 0.44–1.00)
GFR, Estimated: >60 mL/min (ref >60)
Glucose, Bld: 111 mg/dL — ABNORMAL HIGH (ref 70–99)
Potassium: 3.2 mmol/L — ABNORMAL LOW (ref 3.5–5.1)
Sodium: 138 mmol/L (ref 135–145)

## 2024-04-27 LAB — BRAIN NATRIURETIC PEPTIDE: B Natriuretic Peptide: 2345.3 pg/mL — ABNORMAL HIGH (ref 0.0–100.0)

## 2024-04-27 MED ORDER — FUROSEMIDE 20 MG PO TABS
60.0000 mg | ORAL_TABLET | Freq: Every day | ORAL | 11 refills | Status: DC
  Filled 2024-04-27: qty 90, 30d supply, fill #0
  Filled 2024-06-10: qty 90, 30d supply, fill #1
  Filled 2024-07-25: qty 90, 30d supply, fill #2

## 2024-04-27 MED ORDER — DULOXETINE HCL 30 MG PO CPEP
30.0000 mg | ORAL_CAPSULE | Freq: Every day | ORAL | 11 refills | Status: DC
  Filled 2024-04-27: qty 30, 30d supply, fill #0
  Filled 2024-06-10: qty 30, 30d supply, fill #1
  Filled 2024-07-25: qty 30, 30d supply, fill #2

## 2024-04-27 MED ORDER — FUROSEMIDE 20 MG PO TABS
40.0000 mg | ORAL_TABLET | Freq: Every day | ORAL | 5 refills | Status: DC
Start: 2024-04-27 — End: 2024-04-27
  Filled 2024-04-27: qty 60, 30d supply, fill #0

## 2024-04-27 MED ORDER — FOLIC ACID 1 MG PO TABS
1.0000 mg | ORAL_TABLET | Freq: Every day | ORAL | 5 refills | Status: DC
  Filled 2024-06-10: qty 30, 30d supply, fill #0
  Filled 2024-07-25: qty 30, 30d supply, fill #1

## 2024-04-27 MED ORDER — SPIRONOLACTONE 25 MG PO TABS
25.0000 mg | ORAL_TABLET | Freq: Every day | ORAL | 11 refills | Status: DC
  Filled 2024-04-27 (×2): qty 30, 30d supply, fill #0
  Filled 2024-06-10: qty 30, 30d supply, fill #1
  Filled 2024-07-27: qty 30, 30d supply, fill #2

## 2024-04-27 MED ORDER — CARVEDILOL 6.25 MG PO TABS
6.2500 mg | ORAL_TABLET | Freq: Two times a day (BID) | ORAL | 5 refills | Status: DC
  Filled 2024-06-10: qty 60, 30d supply, fill #0
  Filled 2024-07-25: qty 60, 30d supply, fill #1

## 2024-04-27 MED ORDER — ATORVASTATIN CALCIUM 80 MG PO TABS
80.0000 mg | ORAL_TABLET | Freq: Every day | ORAL | 5 refills | Status: AC
  Filled 2024-04-27: qty 30, 30d supply, fill #0
  Filled 2024-06-10: qty 30, 30d supply, fill #1
  Filled 2024-07-25: qty 30, 30d supply, fill #2
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
## 2024-04-27 NOTE — Patient Instructions (Signed)
 Medication Changes:  INCREASE ATORVASTATIN  TO 80 MG ONCE DAILY  INCREASE LASIX  TO 40 MG ONCE DAILY   Lab Work:  Go over to the [facility]. Go pass the gift shop and have your blood work completed.  We will only call you if the results are abnormal or if the provider would like to make medication changes.  No news is good news.   Follow-Up in: 1 MONTH WITH ELLOUISE CLASS, FNP.   Thank you for choosing [facility].    At the Advanced Heart Failure Clinic, you and your health needs are our priority. We have a designated team specialized in the treatment of Heart Failure. This Care Team includes your primary Heart Failure Specialized Cardiologist (physician), Advanced Practice Providers (APPs- Physician Assistants and Nurse Practitioners), and Pharmacist who all work together to provide you with the care you need, when you need it.   You may see any of the following providers on your designated Care Team at your next follow up:  Dr. Toribio Fuel Dr. Ezra Shuck Dr. Ria Commander Dr. Morene Brownie ELLOUISE CLASS, FNP Jaun Bash, RPH-CPP  Please be sure to bring in all your medications bottles to every appointment.   Need to Contact Us :  If you have any questions or concerns before your next appointment please send us  a message through [place] or call our office at [phone].    TO LEAVE A MESSAGE FOR THE NURSE SELECT OPTION 2, PLEASE LEAVE A MESSAGE INCLUDING: YOUR NAME DATE OF BIRTH CALL BACK NUMBER REASON FOR CALL**this is important as we prioritize the call backs  YOU WILL RECEIVE A CALL BACK THE SAME DAY AS LONG AS YOU CALL BEFORE 4:00 PM

## 2024-04-28 ENCOUNTER — Telehealth: Payer: Self-pay

## 2024-04-28 NOTE — Telephone Encounter (Signed)
 Copied from CRM [id number]. Topic: Clinical - Order For Equipment >> Apr 28, 2024  1:54 PM Isabell A wrote: Reason for CRM: Patient calling in regard to her CPAP machine - states her ordered was voided by [facility].   Callback number: [phone]

## 2024-04-28 NOTE — Telephone Encounter (Signed)
 I spoke with the patient. She said she did speak with [facility] and they were suppose to come out and set her up with a CPAP and no one came out. When she called them back they said they had voided the order.   Donzell can you please send the order to [facility]? Do you need a new order?

## 2024-04-28 NOTE — Telephone Encounter (Signed)
 I have sent urgent message to [facility]

## 2024-04-29 NOTE — Telephone Encounter (Signed)
 Noted. Nothing further needed.

## 2024-04-29 NOTE — Telephone Encounter (Signed)
 I received a message from [place] with [facility] [place], [place]   We left her several messages, emailed and texted her to call us  back to schedule her setup appt.  She called us  back once on 7/13 and left a message for us  to call her and we attempted 3 times that day to reach her and phone said she was unable to take calls at this time.  After no response her order was voided, I will email my intake team to put the order back and on and we will attempt to reach her again to get her scheduled

## 2024-05-04 ENCOUNTER — Telehealth: Payer: Self-pay | Admitting: Family

## 2024-05-04 NOTE — Telephone Encounter (Signed)
 Patient called in wanting permission to be able to drive again. At last OV, she was wearing a neck collar as she has a lot of neck pain and is supposed to be having C3/4 anterior cervical decompression/ fusion in the future. Advised patient that any decision to drive needs to be made by her neurosurgeon. She verbalized understanding.

## 2024-05-05 ENCOUNTER — Telehealth (HOSPITAL_COMMUNITY): Payer: Self-pay

## 2024-05-05 ENCOUNTER — Telehealth: Payer: Self-pay

## 2024-05-05 NOTE — Telephone Encounter (Signed)
 Pt called wanting clearance to drive. Advised that she will have to bring in clearance form from [facility] for us  to fill out the cardiology portion. Pt verbalized understanding and is agreeable.

## 2024-05-16 ENCOUNTER — Ambulatory Visit: Payer: MEDICAID | Admitting: Sleep Medicine

## 2024-05-31 ENCOUNTER — Telehealth: Payer: Self-pay | Admitting: Family

## 2024-05-31 NOTE — Telephone Encounter (Signed)
 Called to confirm/remind patient of their appointment at the [facility] on 06/01/24.   Appointment:   [] Confirmed  [] Left mess   [x] No answer/No voice mail  [] VM Full/unable to leave message  [] Phone not in service  Patient reminded to bring all medications and/or complete list.  Confirmed patient has transportation. Gave directions, instructed to utilize valet parking.

## 2024-06-01 ENCOUNTER — Telehealth: Payer: Self-pay | Admitting: Family

## 2024-06-01 ENCOUNTER — Encounter: Payer: MEDICAID | Admitting: Family

## 2024-06-01 NOTE — Telephone Encounter (Signed)
 Patient did not show for her [facility] appointment on 06/01/24.
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
## 2024-06-07 ENCOUNTER — Ambulatory Visit: Payer: MEDICAID | Admitting: Nurse Practitioner

## 2024-06-10 ENCOUNTER — Other Ambulatory Visit: Payer: Self-pay

## 2024-06-14 ENCOUNTER — Other Ambulatory Visit: Payer: Self-pay

## 2024-07-25 ENCOUNTER — Other Ambulatory Visit: Payer: Self-pay

## 2024-07-25 MED FILL — Folic Acid Tab 1 MG: ORAL | 30 days supply | Qty: 30 | Fill #1 | Status: AC

## 2024-07-27 ENCOUNTER — Other Ambulatory Visit: Payer: Self-pay

## 2024-08-08 ENCOUNTER — Ambulatory Visit: Payer: MEDICAID | Admitting: Nurse Practitioner

## 2024-08-15 ENCOUNTER — Ambulatory Visit: Payer: MEDICAID | Admitting: Sleep Medicine

## 2024-09-13 ENCOUNTER — Ambulatory Visit: Payer: MEDICAID | Admitting: Sleep Medicine

## 2024-09-14 ENCOUNTER — Ambulatory Visit: Payer: MEDICAID | Admitting: Sleep Medicine

## 2024-09-19 ENCOUNTER — Emergency Department: Payer: MEDICAID

## 2024-09-19 ENCOUNTER — Emergency Department
Admission: EM | Admit: 2024-09-19 | Discharge: 2024-09-20 | Disposition: A | Discharge status: Home / Self Care | Payer: MEDICAID | Attending: Emergency Medicine | Admitting: Emergency Medicine

## 2024-09-19 ENCOUNTER — Other Ambulatory Visit: Payer: Self-pay

## 2024-09-19 ENCOUNTER — Encounter: Payer: Self-pay | Admitting: Intensive Care

## 2024-09-19 DIAGNOSIS — M79602 Pain in left arm: Secondary | ICD-10-CM | POA: Insufficient documentation

## 2024-09-19 DIAGNOSIS — M5412 Radiculopathy, cervical region: Secondary | ICD-10-CM

## 2024-09-19 DIAGNOSIS — M542 Cervicalgia: Secondary | ICD-10-CM | POA: Insufficient documentation

## 2024-09-19 DIAGNOSIS — R079 Chest pain, unspecified: Secondary | ICD-10-CM | POA: Insufficient documentation

## 2024-09-19 HISTORY — DX: Heart failure, unspecified: I50.9

## 2024-09-19 LAB — BASIC METABOLIC PANEL WITH GFR
Anion gap: 12 (ref 5–15)
BUN: 15 mg/dL (ref 8–23)
CO2: 24 mmol/L (ref 22–32)
Calcium: 9.2 mg/dL (ref 8.9–10.3)
Chloride: 105 mmol/L (ref 98–111)
Creatinine, Ser: 0.7 mg/dL (ref 0.44–1.00)
GFR, Estimated: >60 mL/min
Glucose, Bld: 114 mg/dL — ABNORMAL HIGH (ref 70–99)
Potassium: 4.7 mmol/L (ref 3.5–5.1)
Sodium: 140 mmol/L (ref 135–145)

## 2024-09-19 LAB — CBC
HCT: 37.8 % (ref 36.0–46.0)
Hemoglobin: 12.6 g/dL (ref 12.0–15.0)
MCH: 30.3 pg (ref 26.0–34.0)
MCHC: 33.3 g/dL (ref 30.0–36.0)
MCV: 90.9 fL (ref 80.0–100.0)
Platelets: 325 10*3/uL (ref 150–400)
RBC: 4.16 MIL/uL (ref 3.87–5.11)
RDW: 13.6 % (ref 11.5–15.5)
WBC: 5.1 10*3/uL (ref 4.0–10.5)
nRBC: 0 % (ref 0.0–0.2)

## 2024-09-19 LAB — TROPONIN T, HIGH SENSITIVITY: Troponin T High Sensitivity: 15 ng/L (ref 0–19)

## 2024-09-19 LAB — LACTIC ACID, PLASMA: Lactic Acid, Venous: 0.9 mmol/L (ref 0.5–1.9)

## 2024-09-19 LAB — SEDIMENTATION RATE: Sed Rate: 28 mm/h (ref 0–30)

## 2024-09-19 MED ORDER — GADOBUTROL 1 MMOL/ML IV SOLN
4.0000 mL | Freq: Once | INTRAVENOUS | Status: AC | PRN
  Administered 2024-09-19: 4 mL via INTRAVENOUS

## 2024-09-19 MED ORDER — MELATONIN 5 MG PO TABS
5.0000 mg | ORAL_TABLET | Freq: Every day | ORAL | Status: DC
  Administered 2024-09-19: 5 mg via ORAL
  Filled 2024-09-19: qty 1

## 2024-09-19 MED ORDER — SODIUM CHLORIDE 0.9 % IV SOLN: Freq: Once | INTRAVENOUS | Status: AC

## 2024-09-19 MED ORDER — CYCLOBENZAPRINE HCL 10 MG PO TABS
10.0000 mg | ORAL_TABLET | Freq: Once | ORAL | Status: AC
  Administered 2024-09-19: 10 mg via ORAL
  Filled 2024-09-19: qty 1

## 2024-09-19 MED ORDER — METHYLPREDNISOLONE 4 MG PO TBPK
ORAL_TABLET | ORAL | 0 refills | Status: AC
  Filled 2024-09-19: qty 21, 6d supply, fill #0

## 2024-09-19 MED ORDER — METHYLPREDNISOLONE SODIUM SUCC 125 MG IJ SOLR
125.0000 mg | Freq: Once | INTRAMUSCULAR | Status: AC
  Administered 2024-09-19: 125 mg via INTRAVENOUS
  Filled 2024-09-19: qty 2

## 2024-09-19 MED ORDER — CYCLOBENZAPRINE HCL 10 MG PO TABS
10.0000 mg | ORAL_TABLET | Freq: Three times a day (TID) | ORAL | 0 refills | Status: AC | PRN
  Filled 2024-09-19: qty 12, 4d supply, fill #0

## 2024-09-19 NOTE — ED Notes (Signed)
 Patient transported to X-ray

## 2024-09-19 NOTE — ED Provider Notes (Signed)
 Emergency department handoff note  Care of this patient was signed out to me at the end of the previous provider shift.  All pertinent patient information was conveyed and all questions were answered.  Patient pending MRI of the cervical spine showing similar findings to the CT concerning for possible discitis in the correct clinical setting.  Patient has no fevers, leukocytosis, or paresthesia/numbness.  I spoken to Dr. Penne Sharps in neurosurgery who also reviewed the images and agrees with plan for treatment of likely radiculitis given patient's symptoms and lack of clinical signs of infection. These results were discussed with the patient including the need for surgical spinal follow-up.  Patient states that she currently sees a spinal surgeon at [facility] but would like to get her care changed to a local spinal surgeon.  Patient was given a referral to [facility] office.  Patient was also given strict return precautions and all questions were answered prior to discharge. Rx: Medrol  Dosepak, Flexeril  Dispo: Discharge home with neurosurgical follow-up as needed   Marceline Napierala K, MD 09/19/24 TYRA

## 2024-09-19 NOTE — ED Triage Notes (Signed)
 Arrived by [facility] from group home. C/o chest pain, right shoulder pain that radiates to neck, and left arm pain X3 days.  History bone spurs, CHF, and COPD  Takes gabapentin    EMS vitals: 145/83 b/p 88HR 100% RA A&O x4
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
## 2024-09-20 ENCOUNTER — Other Ambulatory Visit: Payer: Self-pay

## 2024-09-20 MED ORDER — FUROSEMIDE 20 MG PO TABS
60.0000 mg | ORAL_TABLET | Freq: Every day | ORAL | 0 refills | Status: AC
  Filled 2024-09-20: qty 90, 30d supply, fill #0

## 2024-09-20 MED ORDER — DULOXETINE HCL 30 MG PO CPEP
30.0000 mg | ORAL_CAPSULE | Freq: Every day | ORAL | 0 refills | Status: AC
  Filled 2024-09-20: qty 30, 30d supply, fill #0

## 2024-09-20 MED ORDER — ATORVASTATIN CALCIUM 20 MG PO TABS
80.0000 mg | ORAL_TABLET | Freq: Every day | ORAL | Status: DC
  Administered 2024-09-20: 80 mg via ORAL
  Filled 2024-09-20: qty 4

## 2024-09-20 MED ORDER — THIAMINE MONONITRATE 100 MG PO TABS
100.0000 mg | ORAL_TABLET | Freq: Every day | ORAL | Status: DC
  Administered 2024-09-20: 100 mg via ORAL
  Filled 2024-09-20: qty 1

## 2024-09-20 MED ORDER — CARVEDILOL 6.25 MG PO TABS
6.2500 mg | ORAL_TABLET | Freq: Two times a day (BID) | ORAL | Status: DC
  Administered 2024-09-20: 6.25 mg via ORAL
  Filled 2024-09-20: qty 1

## 2024-09-20 MED ORDER — LOSARTAN POTASSIUM 25 MG PO TABS
12.5000 mg | ORAL_TABLET | Freq: Every day | ORAL | Status: DC
  Administered 2024-09-20: 12.5 mg via ORAL
  Filled 2024-09-20: qty 0.5

## 2024-09-20 MED ORDER — FOLIC ACID 1 MG PO TABS
1.0000 mg | ORAL_TABLET | Freq: Every day | ORAL | 0 refills | Status: AC
  Filled 2024-09-20: qty 30, 30d supply, fill #0

## 2024-09-20 MED ORDER — GABAPENTIN 300 MG PO CAPS
300.0000 mg | ORAL_CAPSULE | Freq: Every day | ORAL | 0 refills | Status: AC
  Filled 2024-09-20: qty 30, 30d supply, fill #0

## 2024-09-20 MED ORDER — SPIRONOLACTONE 25 MG PO TABS
25.0000 mg | ORAL_TABLET | Freq: Every day | ORAL | Status: DC
  Administered 2024-09-20: 25 mg via ORAL
  Filled 2024-09-20: qty 1

## 2024-09-20 MED ORDER — FOLIC ACID 1 MG PO TABS
1.0000 mg | ORAL_TABLET | Freq: Every day | ORAL | Status: DC
  Administered 2024-09-20: 1 mg via ORAL
  Filled 2024-09-20: qty 1

## 2024-09-20 MED ORDER — LOSARTAN POTASSIUM 25 MG PO TABS
12.5000 mg | ORAL_TABLET | Freq: Every day | ORAL | 0 refills | Status: AC
  Filled 2024-09-20: qty 30, 60d supply, fill #0

## 2024-09-20 MED ORDER — VITAMIN B-1 100 MG PO TABS
100.0000 mg | ORAL_TABLET | Freq: Every day | ORAL | 0 refills | Status: AC
  Filled 2024-09-20: qty 30, 30d supply, fill #0

## 2024-09-20 MED ORDER — ASPIRIN 81 MG PO TBEC
81.0000 mg | DELAYED_RELEASE_TABLET | Freq: Every day | ORAL | Status: DC
  Administered 2024-09-20: 81 mg via ORAL
  Filled 2024-09-20: qty 1

## 2024-09-20 MED ORDER — CARVEDILOL 6.25 MG PO TABS
6.2500 mg | ORAL_TABLET | Freq: Two times a day (BID) | ORAL | 0 refills | Status: AC
  Filled 2024-09-20: qty 60, 30d supply, fill #0

## 2024-09-20 MED ORDER — FUROSEMIDE 40 MG PO TABS
60.0000 mg | ORAL_TABLET | Freq: Every day | ORAL | Status: DC
  Administered 2024-09-20: 60 mg via ORAL
  Filled 2024-09-20: qty 2

## 2024-09-20 MED ORDER — GABAPENTIN 300 MG PO CAPS
300.0000 mg | ORAL_CAPSULE | Freq: Every day | ORAL | Status: DC
  Administered 2024-09-20: 300 mg via ORAL
  Filled 2024-09-20: qty 1

## 2024-09-20 MED ORDER — CYCLOBENZAPRINE HCL 10 MG PO TABS
10.0000 mg | ORAL_TABLET | Freq: Three times a day (TID) | ORAL | Status: DC | PRN
  Administered 2024-09-20: 10 mg via ORAL
  Filled 2024-09-20: qty 1

## 2024-09-20 MED ORDER — DULOXETINE HCL 30 MG PO CPEP
30.0000 mg | ORAL_CAPSULE | Freq: Every day | ORAL | Status: DC
  Administered 2024-09-20: 30 mg via ORAL
  Filled 2024-09-20: qty 1

## 2024-09-20 MED ORDER — SPIRONOLACTONE 25 MG PO TABS
25.0000 mg | ORAL_TABLET | Freq: Every day | ORAL | 0 refills | Status: AC
  Filled 2024-09-20: qty 30, 30d supply, fill #0

## 2024-09-20 MED ORDER — ASPIRIN 81 MG PO TBEC
81.0000 mg | DELAYED_RELEASE_TABLET | Freq: Every day | ORAL | 0 refills | Status: AC
  Filled 2024-09-20: qty 30, 30d supply, fill #0

## 2024-09-20 NOTE — ED Notes (Signed)
 Called  [facility]  they will send  someone to  pick pt  up  pt  reference   number  [phone]

## 2024-09-20 NOTE — ED Notes (Signed)
 Attempted to call pt's group home [facility] at [phone]. No answer.
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
